# Patient Record
Sex: Female | Born: 1955 | Race: White | Hispanic: No | Marital: Married | Smoking: Never smoker
Health system: Southern US, Community
[De-identification: ages and names within clinical notes are randomized; demographics above are authoritative.]

## PROBLEM LIST (undated history)

## (undated) DIAGNOSIS — Z8719 Personal history of other diseases of the digestive system: Secondary | ICD-10-CM

## (undated) DIAGNOSIS — I10 Essential (primary) hypertension: Secondary | ICD-10-CM

## (undated) DIAGNOSIS — Z8489 Family history of other specified conditions: Secondary | ICD-10-CM

## (undated) DIAGNOSIS — E785 Hyperlipidemia, unspecified: Secondary | ICD-10-CM

## (undated) DIAGNOSIS — K219 Gastro-esophageal reflux disease without esophagitis: Secondary | ICD-10-CM

## (undated) DIAGNOSIS — M1990 Unspecified osteoarthritis, unspecified site: Secondary | ICD-10-CM

## (undated) DIAGNOSIS — T7840XA Allergy, unspecified, initial encounter: Secondary | ICD-10-CM

## (undated) HISTORY — DX: Hyperlipidemia, unspecified: E78.5

## (undated) HISTORY — PX: TUBAL LIGATION: SHX77

## (undated) HISTORY — PX: EYE SURGERY: SHX253

## (undated) HISTORY — DX: Allergy, unspecified, initial encounter: T78.40XA

## (undated) HISTORY — DX: Essential (primary) hypertension: I10

---
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
## 1981-07-10 HISTORY — PX: APPENDECTOMY: SHX54

## 2004-09-09 ENCOUNTER — Ambulatory Visit: Payer: Self-pay | Admitting: Unknown Physician Specialty

## 2006-03-30 DIAGNOSIS — K219 Gastro-esophageal reflux disease without esophagitis: Secondary | ICD-10-CM | POA: Insufficient documentation

## 2006-03-30 DIAGNOSIS — E7800 Pure hypercholesterolemia, unspecified: Secondary | ICD-10-CM | POA: Insufficient documentation

## 2006-03-30 DIAGNOSIS — E782 Mixed hyperlipidemia: Secondary | ICD-10-CM | POA: Insufficient documentation

## 2006-07-10 HISTORY — PX: LASIK: SHX215

## 2006-09-11 DIAGNOSIS — R0681 Apnea, not elsewhere classified: Secondary | ICD-10-CM | POA: Insufficient documentation

## 2007-01-07 DIAGNOSIS — I10 Essential (primary) hypertension: Secondary | ICD-10-CM | POA: Insufficient documentation

## 2008-04-10 DIAGNOSIS — J45909 Unspecified asthma, uncomplicated: Secondary | ICD-10-CM | POA: Insufficient documentation

## 2008-05-14 ENCOUNTER — Ambulatory Visit: Payer: Self-pay | Admitting: Family Medicine

## 2008-05-14 ENCOUNTER — Encounter: Payer: Self-pay | Admitting: Family Medicine

## 2008-05-28 ENCOUNTER — Ambulatory Visit: Payer: Self-pay | Admitting: Obstetrics & Gynecology

## 2008-11-09 DIAGNOSIS — K921 Melena: Secondary | ICD-10-CM | POA: Insufficient documentation

## 2009-02-22 DIAGNOSIS — R42 Dizziness and giddiness: Secondary | ICD-10-CM | POA: Insufficient documentation

## 2009-11-16 ENCOUNTER — Ambulatory Visit: Payer: Self-pay | Admitting: Family Medicine

## 2009-11-16 DIAGNOSIS — G2581 Restless legs syndrome: Secondary | ICD-10-CM | POA: Insufficient documentation

## 2010-11-22 NOTE — Assessment & Plan Note (Signed)
NAME:  Bridget Green, Bridget Green              ACCOUNT NO.:  [id number]   MEDICAL RECORD NO.:  [id number]          PATIENT TYPE:  POB   LOCATION:  [facility] at [facility]         FACILITY:  [facility]   PHYSICIAN:  Tinnie Gens, MD        DATE OF BIRTH:  07-04-1956   DATE OF SERVICE:  05/13/2008                                  CLINIC NOTE   CHIEF COMPLAINT:  Abnormal Pap.   HISTORY OF PRESENT ILLNESS:  The patient is a 54 year old gravida 2,  para 2, who was referred from [facility] for an  abnormal Pap smear.  This is her first abnormal Pap ever she is married  and has no new sexual partners.  I do not find evidence of high-risk HPV  typing being done on this patient.   PAST MEDICAL HISTORY:  Significant for hypertension,  hypercholesterolemia, reflux, and asthma.   PAST SURGICAL HISTORY:  C-section x2, tonsillectomy, appendectomy, and  LASIK eye surgery   MEDICATIONS:  1. Lisinopril 5 mg 1 p.o. daily.  2. Prilosec OTC 1 p.o. daily.  3. Niacin 1 p.o. daily.  4. Multivitamin 1 p.o. daily.  5. Fish oil 1000 mg 2 p.o. b.i.d.  6. Fiber therapy at 5 g per day.  7. Advair Diskus 250/50 one b.i.d.  8. Allegra D 1 daily.  9. Nasonex 2 sprays each nostril daily.  10.Lisinopril 5 mg daily   ALLERGIES:  PENICILLIN and SULFA.   FAMILY HISTORY:  Significant for cardiac disease and hypertension.   SOCIAL HISTORY:  The patient is married.  She lives with her spouse.  She is getting ready to have her first grandchild.  She is a [employer] architect.  She has no smoking history.  No alcohol use.   REVIEW OF SYSTEMS:  Reviewed.  Please see GYN history in the chart, but  is essentially negative, except for hot flashes and night sweats, which  continue.  She reports being menopausal since age 60.   PHYSICAL EXAMINATION:  GENERAL:  She is a well-developed, well-nourished  female in no acute distress.  ABDOMEN:  Soft, nontender, and nondistended.  GU:  Normal external female  genitalia.  BUS is normal.  Vagina is  somewhat atrophic.  Cervix is very small and Pap smear is done.   PROCEDURE:  Colposcopy.  Acetic acid wash was placed over the cervix.  No significant acetowhite areas were identified.  Attempted endocervical  curettage is made, although the cervix is somewhat stenotic.   IMPRESSION:  Abnormal Pap atypical squamous cells of uncertain  significance.  No history of high-risk human papillomavirus being done.  We will await results of that and have her follow up in 2 weeks for  results.  We will keep you updated as to her progress.   Thank you again for referring this patient to our office.             ______________________________  Tinnie Gens, MD     TP/MEDQ  D:  05/14/2008  T:  05/15/2008  Job:  [id number]

## 2011-04-20 ENCOUNTER — Ambulatory Visit: Payer: Self-pay | Admitting: Family Medicine

## 2011-09-26 ENCOUNTER — Ambulatory Visit: Payer: Self-pay | Admitting: Family Medicine

## 2011-09-29 ENCOUNTER — Ambulatory Visit: Payer: Self-pay | Admitting: Family Medicine

## 2011-11-22 ENCOUNTER — Ambulatory Visit: Payer: Self-pay | Admitting: General Surgery

## 2011-12-07 ENCOUNTER — Ambulatory Visit: Payer: Self-pay | Admitting: Unknown Physician Specialty

## 2011-12-07 LAB — BASIC METABOLIC PANEL
Anion Gap: 5 — ABNORMAL LOW (ref 7–16)
BUN: 11 mg/dL (ref 7–18)
Calcium, Total: 9.1 mg/dL (ref 8.5–10.1)
Chloride: 107 mmol/L (ref 98–107)
Creatinine: 0.66 mg/dL (ref 0.60–1.30)
EGFR (African American): >60
EGFR (Non-African Amer.): >60
Sodium: 141 mmol/L (ref 136–145)

## 2011-12-07 LAB — CBC WITH DIFFERENTIAL/PLATELET
Basophil %: 0.4 %
Eosinophil %: 1.1 %
HCT: 42.8 % (ref 35.0–47.0)
Lymphocyte #: 2.8 10*3/uL (ref 1.0–3.6)
MCH: 30.3 pg (ref 26.0–34.0)
MCHC: 33.3 g/dL (ref 32.0–36.0)
Monocyte #: 0.4 x10 3/mm (ref 0.2–0.9)
Neutrophil #: 1.9 10*3/uL (ref 1.4–6.5)
Platelet: 232 10*3/uL (ref 150–440)

## 2011-12-08 ENCOUNTER — Ambulatory Visit: Payer: Self-pay | Admitting: General Surgery

## 2011-12-09 HISTORY — PX: CHOLECYSTECTOMY: SHX55

## 2011-12-12 LAB — PATHOLOGY REPORT

## 2012-04-24 ENCOUNTER — Ambulatory Visit: Payer: Self-pay | Admitting: Family Medicine

## 2013-04-17 ENCOUNTER — Ambulatory Visit: Payer: Self-pay | Admitting: Family Medicine

## 2014-04-28 ENCOUNTER — Ambulatory Visit: Payer: Self-pay | Admitting: Family Medicine

## 2014-08-04 DIAGNOSIS — M2550 Pain in unspecified joint: Secondary | ICD-10-CM | POA: Insufficient documentation

## 2014-10-22 ENCOUNTER — Ambulatory Visit: Admit: 2014-10-22 | Disposition: A | Discharge status: Home / Self Care | Payer: Self-pay | Admitting: Family Medicine

## 2015-02-22 ENCOUNTER — Other Ambulatory Visit: Payer: Self-pay | Admitting: Family Medicine

## 2015-03-22 ENCOUNTER — Telehealth: Payer: Self-pay | Admitting: Family Medicine

## 2015-03-22 NOTE — Telephone Encounter (Signed)
Left patient a voicemail advising her to call back to schedule a appointment to receive a Tdap.

## 2015-03-22 NOTE — Telephone Encounter (Signed)
Pt states she is need to have a Tdap due to having a grandchild being born.  Pt would like to come after 3:30.  CB#[phone]/MW

## 2015-03-23 ENCOUNTER — Ambulatory Visit: Payer: Self-pay | Admitting: Family Medicine

## 2015-03-23 DIAGNOSIS — E559 Vitamin D deficiency, unspecified: Secondary | ICD-10-CM | POA: Insufficient documentation

## 2015-03-23 DIAGNOSIS — K2100 Gastro-esophageal reflux disease with esophagitis, without bleeding: Secondary | ICD-10-CM | POA: Insufficient documentation

## 2015-03-23 DIAGNOSIS — M6790 Unspecified disorder of synovium and tendon, unspecified site: Secondary | ICD-10-CM | POA: Insufficient documentation

## 2015-03-23 DIAGNOSIS — B029 Zoster without complications: Secondary | ICD-10-CM | POA: Insufficient documentation

## 2015-03-26 ENCOUNTER — Ambulatory Visit (INDEPENDENT_AMBULATORY_CARE_PROVIDER_SITE_OTHER): Payer: BLUE CROSS/BLUE SHIELD | Admitting: Family Medicine

## 2015-03-26 VITALS — Temp 98.0°F

## 2015-03-26 DIAGNOSIS — Z23 Encounter for immunization: Secondary | ICD-10-CM

## 2015-04-12 ENCOUNTER — Ambulatory Visit (INDEPENDENT_AMBULATORY_CARE_PROVIDER_SITE_OTHER): Payer: BLUE CROSS/BLUE SHIELD | Admitting: Family Medicine

## 2015-04-12 ENCOUNTER — Encounter: Payer: Self-pay | Admitting: Family Medicine

## 2015-04-12 VITALS — BP 132/90 | HR 76 | Temp 98.1°F | Resp 16 | Wt 193.2 lb

## 2015-04-12 DIAGNOSIS — M545 Low back pain, unspecified: Secondary | ICD-10-CM

## 2015-04-12 DIAGNOSIS — M5136 Other intervertebral disc degeneration, lumbar region: Secondary | ICD-10-CM

## 2015-04-12 MED ORDER — MELOXICAM 15 MG PO TABS: 15.0000 mg | ORAL_TABLET | Freq: Every day | ORAL | Status: DC

## 2015-04-12 NOTE — Progress Notes (Signed)
Patient ID: Bridget Green, female   DOB: [birth year], 59 y.o.   MRN: [id number] Name: Bridget Green   MRN: [id number]    DOB: 05/11/1956   Date:04/12/2015       Progress Note  Subjective  Chief Complaint  Chief Complaint  Patient presents with  . Back Pain    lower back pain X 1 month    Back Pain This is a new problem. The current episode started more than 1 month ago. The problem occurs constantly. The problem has been gradually worsening since onset. The pain is present in the lumbar spine. The quality of the pain is described as aching. The pain radiates to the right thigh. The pain is at a severity of 3/10. The pain is moderate. The pain is worse during the day. The symptoms are aggravated by sitting. Stiffness is present all day. Associated symptoms include leg pain. Pertinent negatives include no numbness, paresthesias, tingling or weakness. She has tried NSAIDs (Only uses 3 tablets of Ibuprofen 200 mg BID) for the symptoms.    Patient Active Problem List   Diagnosis Date Noted  . Esophagitis, reflux 03/23/2015  . Herpes zona 03/23/2015  . Tendon nodule 03/23/2015  . Avitaminosis D 03/23/2015  . Ache in joint 08/04/2014  . Restless leg 11/16/2009  . Blood in feces 11/09/2008  . Asthma, exogenous 04/10/2008  . Essential (primary) hypertension 01/07/2007  . Breathlessness on exertion 09/11/2006  . Acid reflux 03/30/2006  . Hypercholesterolemia without hypertriglyceridemia 03/30/2006   Past Surgical History  Procedure Laterality Date  . Lasik Bilateral 2008  . Cesarean section  [id number]  . Appendectomy  1983  . Cholecystectomy  12/2011  . Tonsillectomy  1960  . Adenoidectomy  1960   History reviewed. No pertinent past medical history.  Social History  Substance Use Topics  . Smoking status: Never Smoker   . Smokeless tobacco: Never Used  . Alcohol Use: 0.0 oz/week    0 Standard drinks or equivalent per week     Comment: OCCASIONALLY    Current outpatient  prescriptions:  .  cholecalciferol (VITAMIN D) 1000 UNITS tablet, Take 1 tablet by mouth daily., Disp: , Rfl:  .  fluticasone (FLONASE) 50 MCG/ACT nasal spray, , Disp: , Rfl: 2 .  lisinopril (PRINIVIL,ZESTRIL) 5 MG tablet, Take 1 tablet by mouth daily., Disp: , Rfl:  .  MULTIPLE VITAMINS PO, Take 1 tablet by mouth daily., Disp: , Rfl:  .  Omega-3 Fatty Acids (FISH OIL) 1200 MG CAPS, Take 2 capsules by mouth daily., Disp: , Rfl:  .  Omeprazole 20 MG TBEC, Take 1 tablet by mouth daily., Disp: , Rfl:  .  ZETIA 10 MG tablet, TAKE ONE TABLET BY MOUTH EACH DAY., Disp: 30 tablet, Rfl: 3  Allergies  Allergen Reactions  . Penicillins Itching  . Sulfa Antibiotics Itching   Review of Systems  Constitutional: Negative.   HENT: Negative.   Eyes: Negative.   Respiratory: Negative.   Cardiovascular: Negative.   Gastrointestinal: Negative.   Genitourinary: Negative.   Musculoskeletal: Positive for back pain.  Skin: Negative.   Neurological: Negative.  Negative for tingling, weakness, numbness and paresthesias.  Endo/Heme/Allergies: Negative.   Psychiatric/Behavioral: Negative.    Objective  Filed Vitals:   04/12/15 1525  BP: 132/90  Pulse: 76  Temp: 98.1 F (36.7 C)  TempSrc: Oral  Resp: 16  Weight: 193 lb 3.2 oz (87.635 kg)  SpO2: 98%   Physical Exam  Constitutional: She is oriented to person, place,  and time and well-developed, well-nourished, and in no distress.  Cardiovascular: Normal rate and regular rhythm.   Pulmonary/Chest: Effort normal and breath sounds normal.  Abdominal: Soft. Bowel sounds are normal.  Musculoskeletal:  Ache and stiffness in lower back. Some radiation to the right anterior thigh but never below the knee. No weakness or numbness. SLR's 90 degrees with some pulling pain on the right.  Neurological: She is alert and oriented to person, place, and time. She has normal reflexes.  Psychiatric: Affect normal.   Assessment & Plan 1. Low back pain associated  with a spinal disorder other than radiculopathy or spinal stenosis Onset gradually over the past month. No known specific injury. Suspect flare of arthritis documented on L-S spine x-ray on 10-22-14. Recommend moist heat applications and given stronger NSAID. May add Percogesic if mild muscle relaxer needed. Recheck in 3-4 weeks if no better.  - meloxicam (MOBIC) 15 MG tablet; Take 1 tablet (15 mg total) by mouth daily.  Dispense: 30 tablet; Refill: 0  2. Lumbar degenerative disc disease Documented L2-L5 of diffuse DDD with sclerosis and spurring on 10-22-14. No radicular pain today. No muscle weakness. Recheck in 3-4 weeks if no better. May need referral to spine specialist/neurosurgeon or trial of prednisone taper.

## 2015-05-06 ENCOUNTER — Telehealth: Payer: Self-pay | Admitting: Family Medicine

## 2015-05-06 ENCOUNTER — Other Ambulatory Visit: Payer: Self-pay | Admitting: Family Medicine

## 2015-05-06 DIAGNOSIS — Z1231 Encounter for screening mammogram for malignant neoplasm of breast: Secondary | ICD-10-CM

## 2015-05-06 NOTE — Telephone Encounter (Signed)
Pt called to let Simona Huh know that the Rx Mobic is working and is having no back pain right now.  CB#[phone]/MW

## 2015-05-06 NOTE — Telephone Encounter (Signed)
Glad to hear it. Use Mobic as needed. Call or return prn.

## 2015-05-08 ENCOUNTER — Other Ambulatory Visit: Payer: Self-pay | Admitting: Family Medicine

## 2015-05-11 ENCOUNTER — Ambulatory Visit
Admission: RE | Admit: 2015-05-11 | Discharge: 2015-05-11 | Disposition: A | Discharge status: Home / Self Care | Payer: BLUE CROSS/BLUE SHIELD | Source: Ambulatory Visit | Attending: Family Medicine | Admitting: Family Medicine

## 2015-05-11 DIAGNOSIS — Z1231 Encounter for screening mammogram for malignant neoplasm of breast: Secondary | ICD-10-CM | POA: Diagnosis not present

## 2015-05-12 ENCOUNTER — Telehealth: Payer: Self-pay

## 2015-05-12 NOTE — Telephone Encounter (Signed)
-----   Message from Margo Common, [place] sent at 05/11/2015  6:06 PM EDT ----- Normal mammogram. Recheck annually.

## 2015-05-12 NOTE — Telephone Encounter (Signed)
Left patient a message on cell voicemail (consent in chart) advising her that results were normal.

## 2015-07-01 ENCOUNTER — Other Ambulatory Visit: Payer: Self-pay | Admitting: Family Medicine

## 2015-07-01 DIAGNOSIS — E7800 Pure hypercholesterolemia, unspecified: Secondary | ICD-10-CM

## 2015-07-01 NOTE — Telephone Encounter (Signed)
Bridget Green.  

## 2015-07-02 ENCOUNTER — Other Ambulatory Visit: Payer: Self-pay | Admitting: Family Medicine

## 2015-07-02 DIAGNOSIS — J309 Allergic rhinitis, unspecified: Secondary | ICD-10-CM

## 2015-08-19 ENCOUNTER — Other Ambulatory Visit: Payer: Self-pay | Admitting: Family Medicine

## 2015-08-19 ENCOUNTER — Ambulatory Visit (INDEPENDENT_AMBULATORY_CARE_PROVIDER_SITE_OTHER): Payer: BLUE CROSS/BLUE SHIELD | Admitting: Family Medicine

## 2015-08-19 ENCOUNTER — Encounter: Payer: Self-pay | Admitting: Family Medicine

## 2015-08-19 VITALS — BP 132/70 | HR 72 | Temp 97.7°F | Resp 16 | Wt 188.0 lb

## 2015-08-19 DIAGNOSIS — E7800 Pure hypercholesterolemia, unspecified: Secondary | ICD-10-CM

## 2015-08-19 DIAGNOSIS — Z0000 Encounter for general adult medical examination without abnormal findings: Secondary | ICD-10-CM

## 2015-08-19 DIAGNOSIS — Z124 Encounter for screening for malignant neoplasm of cervix: Secondary | ICD-10-CM | POA: Diagnosis not present

## 2015-08-19 DIAGNOSIS — I10 Essential (primary) hypertension: Secondary | ICD-10-CM

## 2015-08-19 DIAGNOSIS — J4520 Mild intermittent asthma, uncomplicated: Secondary | ICD-10-CM | POA: Diagnosis not present

## 2015-08-19 DIAGNOSIS — K219 Gastro-esophageal reflux disease without esophagitis: Secondary | ICD-10-CM | POA: Diagnosis not present

## 2015-08-19 NOTE — Progress Notes (Signed)
Patient ID: Bridget Green, female   DOB: 1955-09-12, 60 y.o.   MRN: [id number]       [name], Female    DOB: 25-Jul-1955, 60 y.o.   MRN: [id number] Visit Date: 08/19/2015  Today's Provider: Vernie Murders, PA   Chief Complaint  Patient presents with  . Annual Exam  . visual changes    X 1 week.    Subjective:    Annual physical exam Bridget Green is a 60 y.o. female who presents today for health maintenance and complete physical. She feels well. She reports exercising not regularly. She reports she is sleeping well.  Last:  Tdap- 03/26/2015  Zoster- 11/27/2014  Mammogram- 05/11/2015  Colonoscopy- 08/28/2008. Repeat by Dr. Vira Agar in                2015 per patient.  Pap- 05/27/2013. Normal, HPV negative.  Hep C- 04/26/2012. Negative       Review of Systems  Constitutional: Negative.   HENT: Negative.   Eyes: Negative.   Respiratory: Negative.   Cardiovascular: Negative.  Negative for chest pain.  Gastrointestinal: Negative.   Endocrine: Negative.   Genitourinary: Negative.   Musculoskeletal: Negative.   Skin: Negative.   Allergic/Immunologic: Negative.   Neurological: Negative.   Hematological: Negative.   Psychiatric/Behavioral: Negative.     Social History      She  reports that she has never smoked. She has never used smokeless tobacco. She reports that she drinks alcohol. She reports that she does not use illicit drugs.       Social History   Social History  . Marital Status: Married    Spouse Name: N/A  . Number of Children: N/A  . Years of Education: N/A   Social History Main Topics  . Smoking status: Never Smoker   . Smokeless tobacco: Never Used  . Alcohol Use: 0.0 oz/week    0 Standard drinks or equivalent per week     Comment: OCCASIONALLY  . Drug Use: No  . Sexual Activity: Not Asked   Other Topics Concern  . None   Social History Narrative    Patient Active Problem List   Diagnosis Date Noted  . Esophagitis,  reflux 03/23/2015  . Herpes zona 03/23/2015  . Tendon nodule 03/23/2015  . Avitaminosis D 03/23/2015  . Ache in joint 08/04/2014  . Restless leg 11/16/2009  . Blood in feces 11/09/2008  . Asthma, exogenous 04/10/2008  . Essential (primary) hypertension 01/07/2007  . Breathlessness on exertion 09/11/2006  . Acid reflux 03/30/2006  . Hypercholesterolemia without hypertriglyceridemia 03/30/2006    Past Surgical History  Procedure Laterality Date  . Lasik Bilateral 2008  . Cesarean section  [id number]  . Appendectomy  1983  . Cholecystectomy  12/2011  . Tonsillectomy  1960  . Adenoidectomy  1960    Family History        Family Status  Relation Status Death Age  . Father Deceased 41  . Sister Alive   . Maternal Grandmother Deceased 40  . Maternal Grandfather Deceased   . Paternal Grandmother Deceased   . Paternal Grandfather Deceased         Her family history includes Aneurysm in her paternal grandfather; Arthritis in her mother and sister; Colon cancer in her father; Heart attack in her maternal grandfather and paternal grandmother; Hyperlipidemia in her mother; Hypertension in her mother and sister; Thyroid cancer in her paternal grandmother.    Allergies  Allergen Reactions  . Penicillins  Itching  . Sulfa Antibiotics Itching    Previous Medications   CHOLECALCIFEROL (VITAMIN D) 1000 UNITS TABLET    Take 1 tablet by mouth daily.   FLUTICASONE (FLONASE) 50 MCG/ACT NASAL SPRAY    INSTILL 2 SPRAYS INTO EACH NOSTRIL AT BEDTIME   LISINOPRIL (PRINIVIL,ZESTRIL) 5 MG TABLET    Take 1 tablet by mouth daily.   MELOXICAM (MOBIC) 15 MG TABLET    TAKE ONE TABLET BY MOUTH EVERY DAY   MULTIPLE VITAMINS PO    Take 1 tablet by mouth daily.   OMEGA-3 FATTY ACIDS (FISH OIL) 1200 MG CAPS    Take 2 capsules by mouth daily.   OMEPRAZOLE 20 MG TBEC    Take 1 tablet by mouth daily.   ZETIA 10 MG TABLET    TAKE ONE TABLET BY MOUTH EVERY DAY    Patient Care Team: Margo Common, PA as PCP  - General (Physician Assistant)     Objective:   Vitals: BP 132/70 mmHg  Pulse 72  Temp(Src) 97.7 F (36.5 C)  Resp 16  Wt 188 lb (85.276 kg)   Physical Exam  Constitutional: She is oriented to person, place, and time. She appears well-developed and well-nourished.  HENT:  Head: Normocephalic and atraumatic.  Right Ear: External ear normal.  Left Ear: External ear normal.  Nose: Nose normal.  Mouth/Throat: Oropharynx is clear and moist.  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  Eyes: Conjunctivae and EOM are normal. Pupils are equal, round, and reactive to light. Right eye exhibits no discharge.  Neck: Normal range of motion. Neck supple. No tracheal deviation present. No thyromegaly present.  Cardiovascular: Normal rate, regular rhythm, normal heart sounds and intact distal pulses.   No murmur heard. Pulmonary/Chest: Effort normal and breath sounds normal. No respiratory distress. She has no wheezes. She has no rales. She exhibits no tenderness.  Abdominal: Soft. She exhibits no distension and no mass. There is no tenderness. There is no rebound and no guarding.  Genitourinary: Vagina normal and uterus normal. Guaiac negative stool.  Musculoskeletal: Normal range of motion. She exhibits no edema or tenderness.  Lymphadenopathy:    She has no cervical adenopathy.  Neurological: She is alert and oriented to person, place, and time. She has normal reflexes. No cranial nerve deficit. She exhibits normal muscle tone. Coordination normal.  Skin: Skin is warm and dry. No rash noted. No erythema.  Psychiatric: She has a normal mood and affect. Her behavior is normal. Judgment and thought content normal.   Assessment & Plan:     Routine Health Maintenance and Physical Exam  Exercise Activities and Dietary recommendations Goals    Recommend walking for exercise 20-30 minute 3-4 days a week and low fat diet.      Immunization History  Administered Date(s) Administered    . Tdap 03/26/2015  . Zoster 11/27/2014    Health Maintenance  Topic Date Due  . HIV Screening  03/17/1969  . INFLUENZA VACCINE  02/08/2015  . PAP SMEAR  05/27/2016  . MAMMOGRAM  05/10/2017  . COLONOSCOPY  08/28/2018  . TETANUS/TDAP  03/25/2025  . ZOSTAVAX  Completed  . Hepatitis C Screening  Completed      Discussed health benefits of physical activity, and encouraged her to engage in regular exercise appropriate for her age and condition.    -------------------------------------------------------------------- 1. Annual physical exam General health stable. Uses hearing aids bilaterally. Due to BMD and recommend continuing vitamin D daily. Colonoscopy and vaccinations up  to date. - DG Bone Density  2. Essential (primary) hypertension Well controlled BP and tolerating Lisinopril 5 mg qd without side effects. Check routine labs and continue present regimen. - CBC with Differential/Platelet  3. Gastroesophageal reflux disease without esophagitis Continues Omeprazole 20 mg prn. No significant dyspepsia, hematemesis or melena recently.   4. Hypercholesterolemia without hypertriglyceridemia Tolerating Zetia and Omega-3 supplement without side effects. Will recheck CMP, TSH and lipids. Continue present regimen and recheck pending reports. - COMPLETE METABOLIC PANEL WITH GFR - Lipid panel - TSH  5. Asthma, exogenous, mild intermittent, uncomplicated Well controlled with rare use of rescue inhaler. Controlling allergic rhinitis with occasional OTC antihistamine and Flonase. Recheck prn.  6. Cervical cancer screening Due for follow up PAP. Last PAP normal 05-27-13. Denies menses, vaginal discharge or pelvic pain. - Cytology - PAP

## 2015-08-19 NOTE — Patient Instructions (Signed)
Health Maintenance, Female Adopting a healthy lifestyle and getting preventive care can go a long way to promote health and wellness. Talk with your health care provider about what schedule of regular examinations is right for you. This is a good chance for you to check in with your provider about disease prevention and staying healthy. In between checkups, there are plenty of things you can do on your own. Experts have done a lot of research about which lifestyle changes and preventive measures are most likely to keep you healthy. Ask your health care provider for more information. WEIGHT AND DIET  Eat a healthy diet  Be sure to include plenty of vegetables, fruits, low-fat dairy products, and lean protein.  Do not eat a lot of foods high in solid fats, added sugars, or salt.  Get regular exercise. This is one of the most important things you can do for your health.  Most adults should exercise for at least 150 minutes each week. The exercise should increase your heart rate and make you sweat (moderate-intensity exercise).  Most adults should also do strengthening exercises at least twice a week. This is in addition to the moderate-intensity exercise.  Maintain a healthy weight  Body mass index (BMI) is a measurement that can be used to identify possible weight problems. It estimates body fat based on height and weight. Your health care provider can help determine your BMI and help you achieve or maintain a healthy weight.  For females 20 years of age and older:   A BMI below 18.5 is considered underweight.  A BMI of 18.5 to 24.9 is normal.  A BMI of 25 to 29.9 is considered overweight.  A BMI of 30 and above is considered obese.  Watch levels of cholesterol and blood lipids  You should start having your blood tested for lipids and cholesterol at 62 years of age, then have this test every 5 years.  You may need to have your cholesterol levels checked more often if:  Your lipid  or cholesterol levels are high.  You are older than 62 years of age.  You are at high risk for heart disease.  CANCER SCREENING   Lung Cancer  Lung cancer screening is recommended for adults 55-80 years old who are at high risk for lung cancer because of a history of smoking.  A yearly low-dose CT scan of the lungs is recommended for people who:  Currently smoke.  Have quit within the past 15 years.  Have at least a 30-pack-year history of smoking. A pack year is smoking an average of one pack of cigarettes a day for 1 year.  Yearly screening should continue until it has been 15 years since you quit.  Yearly screening should stop if you develop a health problem that would prevent you from having lung cancer treatment.  Breast Cancer  Practice breast self-awareness. This means understanding how your breasts normally appear and feel.  It also means doing regular breast self-exams. Let your health care provider know about any changes, no matter how small.  If you are in your 20s or 30s, you should have a clinical breast exam (CBE) by a health care provider every 1-3 years as part of a regular health exam.  If you are 40 or older, have a CBE every year. Also consider having a breast X-ray (mammogram) every year.  If you have a family history of breast cancer, talk to your health care provider about genetic screening.  If you   are at high risk for breast cancer, talk to your health care provider about having an MRI and a mammogram every year.  Breast cancer gene (BRCA) assessment is recommended for women who have family members with BRCA-related cancers. BRCA-related cancers include:  Breast.  Ovarian.  Tubal.  Peritoneal cancers.  Results of the assessment will determine the need for genetic counseling and BRCA1 and BRCA2 testing. Cervical Cancer Your health care provider may recommend that you be screened regularly for cancer of the pelvic organs (ovaries, uterus, and  vagina). This screening involves a pelvic examination, including checking for microscopic changes to the surface of your cervix (Pap test). You may be encouraged to have this screening done every 3 years, beginning at age 21.  For women ages 30-65, health care providers may recommend pelvic exams and Pap testing every 3 years, or they may recommend the Pap and pelvic exam, combined with testing for human papilloma virus (HPV), every 5 years. Some types of HPV increase your risk of cervical cancer. Testing for HPV may also be done on women of any age with unclear Pap test results.  Other health care providers may not recommend any screening for nonpregnant women who are considered low risk for pelvic cancer and who do not have symptoms. Ask your health care provider if a screening pelvic exam is right for you.  If you have had past treatment for cervical cancer or a condition that could lead to cancer, you need Pap tests and screening for cancer for at least 20 years after your treatment. If Pap tests have been discontinued, your risk factors (such as having a new sexual partner) need to be reassessed to determine if screening should resume. Some women have medical problems that increase the chance of getting cervical cancer. In these cases, your health care provider may recommend more frequent screening and Pap tests. Colorectal Cancer  This type of cancer can be detected and often prevented.  Routine colorectal cancer screening usually begins at 62 years of age and continues through 62 years of age.  Your health care provider may recommend screening at an earlier age if you have risk factors for colon cancer.  Your health care provider may also recommend using home test kits to check for hidden blood in the stool.  A small camera at the end of a tube can be used to examine your colon directly (sigmoidoscopy or colonoscopy). This is done to check for the earliest forms of colorectal  cancer.  Routine screening usually begins at age 50.  Direct examination of the colon should be repeated every 5-10 years through 62 years of age. However, you may need to be screened more often if early forms of precancerous polyps or small growths are found. Skin Cancer  Check your skin from head to toe regularly.  Tell your health care provider about any new moles or changes in moles, especially if there is a change in a mole's shape or color.  Also tell your health care provider if you have a mole that is larger than the size of a pencil eraser.  Always use sunscreen. Apply sunscreen liberally and repeatedly throughout the day.  Protect yourself by wearing long sleeves, pants, a wide-brimmed hat, and sunglasses whenever you are outside. HEART DISEASE, DIABETES, AND HIGH BLOOD PRESSURE   High blood pressure causes heart disease and increases the risk of stroke. High blood pressure is more likely to develop in:  People who have blood pressure in the high end   of the normal range (130-139/85-89 mm Hg).  People who are overweight or obese.  People who are African American.  If you are 38-23 years of age, have your blood pressure checked every 3-5 years. If you are 61 years of age or older, have your blood pressure checked every year. You should have your blood pressure measured twice--once when you are at a hospital or clinic, and once when you are not at a hospital or clinic. Record the average of the two measurements. To check your blood pressure when you are not at a hospital or clinic, you can use:  An automated blood pressure machine at a pharmacy.  A home blood pressure monitor.  If you are between 45 years and 39 years old, ask your health care provider if you should take aspirin to prevent strokes.  Have regular diabetes screenings. This involves taking a blood sample to check your fasting blood sugar level.  If you are at a normal weight and have a low risk for diabetes,  have this test once every three years after 62 years of age.  If you are overweight and have a high risk for diabetes, consider being tested at a younger age or more often. PREVENTING INFECTION  Hepatitis B  If you have a higher risk for hepatitis B, you should be screened for this virus. You are considered at high risk for hepatitis B if:  You were born in a country where hepatitis B is common. Ask your health care provider which countries are considered high risk.  Your parents were born in a high-risk country, and you have not been immunized against hepatitis B (hepatitis B vaccine).  You have HIV or AIDS.  You use needles to inject street drugs.  You live with someone who has hepatitis B.  You have had sex with someone who has hepatitis B.  You get hemodialysis treatment.  You take certain medicines for conditions, including cancer, organ transplantation, and autoimmune conditions. Hepatitis C  Blood testing is recommended for:  Everyone born from [birth year] through 1967.  Anyone with known risk factors for hepatitis C. Sexually transmitted infections (STIs)  You should be screened for sexually transmitted infections (STIs) including gonorrhea and chlamydia if:  You are sexually active and are younger than 62 years of age.  You are older than 62 years of age and your health care provider tells you that you are at risk for this type of infection.  Your sexual activity has changed since you were last screened and you are at an increased risk for chlamydia or gonorrhea. Ask your health care provider if you are at risk.  If you do not have HIV, but are at risk, it may be recommended that you take a prescription medicine daily to prevent HIV infection. This is called pre-exposure prophylaxis (PrEP). You are considered at risk if:  You are sexually active and do not regularly use condoms or know the HIV status of your partner(s).  You take drugs by injection.  You are sexually  active with a partner who has HIV. Talk with your health care provider about whether you are at high risk of being infected with HIV. If you choose to begin PrEP, you should first be tested for HIV. You should then be tested every 3 months for as long as you are taking PrEP.  PREGNANCY   If you are premenopausal and you may become pregnant, ask your health care provider about preconception counseling.  If you may  become pregnant, take 400 to 800 micrograms (mcg) of folic acid every day.  If you want to prevent pregnancy, talk to your health care provider about birth control (contraception). OSTEOPOROSIS AND MENOPAUSE   Osteoporosis is a disease in which the bones lose minerals and strength with aging. This can result in serious bone fractures. Your risk for osteoporosis can be identified using a bone density scan.  If you are 65 years of age or older, or if you are at risk for osteoporosis and fractures, ask your health care provider if you should be screened.  Ask your health care provider whether you should take a calcium or vitamin D supplement to lower your risk for osteoporosis.  Menopause may have certain physical symptoms and risks.  Hormone replacement therapy may reduce some of these symptoms and risks. Talk to your health care provider about whether hormone replacement therapy is right for you.  HOME CARE INSTRUCTIONS   Schedule regular health, dental, and eye exams.  Stay current with your immunizations.   Do not use any tobacco products including cigarettes, chewing tobacco, or electronic cigarettes.  If you are pregnant, do not drink alcohol.  If you are breastfeeding, limit how much and how often you drink alcohol.  Limit alcohol intake to no more than 1 drink per day for nonpregnant women. One drink equals 12 ounces of beer, 5 ounces of wine, or 1 ounces of hard liquor.  Do not use street drugs.  Do not share needles.  Ask your health care provider for help if  you need support or information about quitting drugs.  Tell your health care provider if you often feel depressed.  Tell your health care provider if you have ever been abused or do not feel safe at home.   This information is not intended to replace advice given to you by your health care provider. Make sure you discuss any questions you have with your health care provider.   Document Released: 01/09/2011 Document Revised: 07/17/2014 Document Reviewed: 05/28/2013 Elsevier Interactive Patient Education 2016 [employer]   Exercising to Stay Healthy Exercising regularly is important. It has many health benefits, such as:  Improving your overall fitness, flexibility, and endurance.  Increasing your bone density.  Helping with weight control.  Decreasing your body fat.  Increasing your muscle strength.  Reducing stress and tension.  Improving your overall health. In order to become healthy and stay healthy, it is recommended that you do moderate-intensity and vigorous-intensity exercise. You can tell that you are exercising at a moderate intensity if you have a higher heart rate and faster breathing, but you are still able to hold a conversation. You can tell that you are exercising at a vigorous intensity if you are breathing much harder and faster and cannot hold a conversation while exercising. HOW OFTEN SHOULD I EXERCISE? Choose an activity that you enjoy and set realistic goals. Your health care provider can help you to make an activity plan that works for you. Exercise regularly as directed by your health care provider. This may include:   Doing resistance training twice each week, such as:  Push-ups.  Sit-ups.  Lifting weights.  Using resistance bands.  Doing a given intensity of exercise for a given amount of time. Choose from these options:  150 minutes of moderate-intensity exercise every week.  75 minutes of vigorous-intensity exercise every week.  A mix of  moderate-intensity and vigorous-intensity exercise every week. Children, pregnant women, people who are out of shape, people   who are overweight, and older adults may need to consult a health care provider for individual recommendations. If you have any sort of medical condition, be sure to consult your health care provider before starting a new exercise program.  WHAT ARE SOME EXERCISE IDEAS? Some moderate-intensity exercise ideas include:   Walking at a rate of 1 mile in 15 minutes.  Biking.  Hiking.  Golfing.  Dancing. Some vigorous-intensity exercise ideas include:   Walking at a rate of at least 4.5 miles per hour.  Jogging or running at a rate of 5 miles per hour.  Biking at a rate of at least 10 miles per hour.  Lap swimming.  Roller-skating or in-line skating.  Cross-country skiing.  Vigorous competitive sports, such as football, basketball, and soccer.  Jumping rope.  Aerobic dancing. WHAT ARE SOME EVERYDAY ACTIVITIES THAT CAN HELP ME TO GET EXERCISE?  Yard work, such as:  Pushing a lawn mower.  Raking and bagging leaves.  Washing and waxing your car.  Pushing a stroller.  Shoveling snow.  Gardening.  Washing windows or floors. HOW CAN I BE MORE ACTIVE IN MY DAY-TO-DAY ACTIVITIES?  Use the stairs instead of the elevator.  Take a walk during your lunch break.  If you drive, park your car farther away from work or school.  If you take public transportation, get off one stop early and walk the rest of the way.  Make all of your phone calls while standing up and walking around.  Get up, stretch, and walk around every 30 minutes throughout the day. WHAT GUIDELINES SHOULD I FOLLOW WHILE EXERCISING?  Do not exercise so much that you hurt yourself, feel dizzy, or get very short of breath.  Consult your health care provider before starting a new exercise program.  Wear comfortable clothes and shoes with good support.  Drink plenty of water while  you exercise to prevent dehydration or heat stroke. Body water is lost during exercise and must be replaced.  Work out until you breathe faster and your heart beats faster.   This information is not intended to replace advice given to you by your health care provider. Make sure you discuss any questions you have with your health care provider.   Document Released: 07/29/2010 Document Revised: 07/17/2014 Document Reviewed: 11/27/2013 Elsevier Interactive Patient Education 2016 [employer]vier Inc.  

## 2015-08-20 LAB — COMPREHENSIVE METABOLIC PANEL
A/G RATIO: 1.8 (ref 1.1–2.5)
ALBUMIN: 4.6 g/dL (ref 3.6–4.8)
ALT: 36 IU/L — AB (ref 0–32)
AST: 21 IU/L (ref 0–40)
Alkaline Phosphatase: 91 IU/L (ref 39–117)
BILIRUBIN TOTAL: 0.5 mg/dL (ref 0.0–1.2)
BUN / CREAT RATIO: 16 (ref 11–26)
BUN: 12 mg/dL (ref 8–27)
CALCIUM: 10 mg/dL (ref 8.7–10.3)
CHLORIDE: 102 mmol/L (ref 96–106)
CO2: 25 mmol/L (ref 18–29)
Creatinine, Ser: 0.76 mg/dL (ref 0.57–1.00)
GFR, EST AFRICAN AMERICAN: 98 mL/min/{1.73_m2} (ref >59)
GFR, EST NON AFRICAN AMERICAN: 85 mL/min/{1.73_m2} (ref >59)
Globulin, Total: 2.6 g/dL (ref 1.5–4.5)
Glucose: 98 mg/dL (ref 65–99)
POTASSIUM: 4.8 mmol/L (ref 3.5–5.2)
Sodium: 145 mmol/L — ABNORMAL HIGH (ref 134–144)
TOTAL PROTEIN: 7.2 g/dL (ref 6.0–8.5)

## 2015-08-20 LAB — CBC WITH DIFFERENTIAL/PLATELET
BASOS ABS: 0 10*3/uL (ref 0.0–0.2)
BASOS: 0 %
EOS (ABSOLUTE): 0.1 10*3/uL (ref 0.0–0.4)
Eos: 1 %
Hematocrit: 41.6 % (ref 34.0–46.6)
Hemoglobin: 14.6 g/dL (ref 11.1–15.9)
IMMATURE GRANS (ABS): 0 10*3/uL (ref 0.0–0.1)
IMMATURE GRANULOCYTES: 0 %
LYMPHS: 55 %
Lymphocytes Absolute: 4.2 10*3/uL — ABNORMAL HIGH (ref 0.7–3.1)
MCH: 31.2 pg (ref 26.6–33.0)
MCHC: 35.1 g/dL (ref 31.5–35.7)
MCV: 89 fL (ref 79–97)
MONOS ABS: 0.4 10*3/uL (ref 0.1–0.9)
Monocytes: 5 %
NEUTROS ABS: 3 10*3/uL (ref 1.4–7.0)
Neutrophils: 39 %
PLATELETS: 267 10*3/uL (ref 150–379)
RBC: 4.68 x10E6/uL (ref 3.77–5.28)
RDW: 12.7 % (ref 12.3–15.4)
WBC: 7.7 10*3/uL (ref 3.4–10.8)

## 2015-08-20 LAB — LIPID PANEL
CHOLESTEROL TOTAL: 217 mg/dL — AB (ref 100–199)
Chol/HDL Ratio: 2.7 ratio units (ref 0.0–4.4)
HDL: 79 mg/dL (ref >39)
LDL Calculated: 115 mg/dL — ABNORMAL HIGH (ref 0–99)
TRIGLYCERIDES: 114 mg/dL (ref 0–149)
VLDL CHOLESTEROL CAL: 23 mg/dL (ref 5–40)

## 2015-08-20 LAB — TSH: TSH: 0.879 u[IU]/mL (ref 0.450–4.500)

## 2015-08-23 ENCOUNTER — Telehealth: Payer: Self-pay | Admitting: Family Medicine

## 2015-08-23 NOTE — Telephone Encounter (Signed)
Pt is requesting lab results.  CB#[phone]/MW

## 2015-08-24 LAB — PAP LB (LIQUID-BASED): PAP SMEAR COMMENT: 0

## 2015-08-26 ENCOUNTER — Telehealth: Payer: Self-pay

## 2015-08-26 NOTE — Telephone Encounter (Signed)
Patient advised as directed below. Patient verbalized understanding and agrees with plan of care.  

## 2015-08-26 NOTE — Telephone Encounter (Signed)
Patient advised as directed below. Patient is requesting lab results from 08/19/2015.

## 2015-08-26 NOTE — Telephone Encounter (Signed)
-----   Message from Margo Common, [place] sent at 08/26/2015 11:49 AM EST ----- Blood tests are all normal except slight elevation of LDL cholesterol. Continue low fat diet and Omega-3 Fish Oil or Krill Oil qd. Recheck annually. Last Vitamin-D level was normal at 33.7. Continue same vitamin-D and calcium supplements to keep bones strong.

## 2015-08-26 NOTE — Telephone Encounter (Signed)
-----   Message from Margo Common, [place] sent at 08/26/2015  8:50 AM EST ----- Normal PAP smear. No signs of cancer cells. Repeat test in 3 years. Continue annual physical exam.

## 2015-09-07 ENCOUNTER — Ambulatory Visit
Admission: RE | Admit: 2015-09-07 | Discharge: 2015-09-07 | Disposition: A | Discharge status: Home / Self Care | Payer: BLUE CROSS/BLUE SHIELD | Source: Ambulatory Visit | Attending: Family Medicine | Admitting: Family Medicine

## 2015-09-07 DIAGNOSIS — Z13820 Encounter for screening for osteoporosis: Secondary | ICD-10-CM | POA: Insufficient documentation

## 2015-09-07 DIAGNOSIS — M8580 Other specified disorders of bone density and structure, unspecified site: Secondary | ICD-10-CM | POA: Insufficient documentation

## 2015-09-09 NOTE — Telephone Encounter (Signed)
Bone density test shows slight osteopenia not osteoporosis yet. Encouraged to continue vitamin-D and Calcium supplements. Recheck BMD in 2 years.

## 2015-09-10 NOTE — Telephone Encounter (Signed)
Lmtcb, there are lab results in the chart also-aa

## 2015-09-15 NOTE — Telephone Encounter (Signed)
Patient advised as directed below. Patient verbalized understanding.  

## 2015-11-08 ENCOUNTER — Other Ambulatory Visit: Payer: Self-pay | Admitting: Family Medicine

## 2015-12-20 ENCOUNTER — Encounter: Payer: Self-pay | Admitting: Family Medicine

## 2015-12-20 ENCOUNTER — Ambulatory Visit (INDEPENDENT_AMBULATORY_CARE_PROVIDER_SITE_OTHER): Payer: BLUE CROSS/BLUE SHIELD | Admitting: Family Medicine

## 2015-12-20 VITALS — BP 130/72 | HR 58 | Temp 98.4°F | Resp 16 | Wt 191.0 lb

## 2015-12-20 DIAGNOSIS — J302 Other seasonal allergic rhinitis: Secondary | ICD-10-CM | POA: Diagnosis not present

## 2015-12-20 DIAGNOSIS — J0100 Acute maxillary sinusitis, unspecified: Secondary | ICD-10-CM

## 2015-12-20 MED ORDER — PREDNISONE 5 MG PO TABS: ORAL_TABLET | ORAL | Status: DC

## 2015-12-20 MED ORDER — AZITHROMYCIN 250 MG PO TABS: ORAL_TABLET | ORAL | Status: DC

## 2015-12-20 NOTE — Progress Notes (Signed)
Patient ID: Bridget Green, female   DOB: 01-Mar-1956, 60 y.o.   MRN: [id number]       [name] Female    DOB: Oct 08, 1955   60 y.o.   MRN: [id number] Visit Date: 12/20/2015  Today's Provider: Vernie Murders, PA   Chief Complaint  Patient presents with  . Sinusitis    X 2 weeks.    Subjective:    Sinusitis This is a recurrent problem. The current episode started 1 to 4 weeks ago. The problem is unchanged. There has been no fever. She is experiencing no pain. Associated symptoms include congestion, coughing, headaches, sinus pressure and sneezing. Past treatments include acetaminophen, oral decongestants and saline sprays. The treatment provided mild relief.    No past medical history on file. Patient Active Problem List   Diagnosis Date Noted  . Esophagitis, reflux 03/23/2015  . Herpes zona 03/23/2015  . Tendon nodule 03/23/2015  . Avitaminosis D 03/23/2015  . Ache in joint 08/04/2014  . Restless leg 11/16/2009  . Blood in feces 11/09/2008  . Asthma, exogenous 04/10/2008  . Essential (primary) hypertension 01/07/2007  . Breathlessness on exertion 09/11/2006  . Acid reflux 03/30/2006  . Hypercholesterolemia without hypertriglyceridemia 03/30/2006   Past Surgical History  Procedure Laterality Date  . Lasik Bilateral 2008  . Cesarean section  [id number]  . Appendectomy  1983  . Cholecystectomy  12/2011  . Tonsillectomy  1960  . Adenoidectomy  1960   Family History  Problem Relation Age of Onset  . Arthritis Mother   . Hypertension Mother   . Hyperlipidemia Mother   . Colon cancer Father   . Arthritis Sister   . Hypertension Sister   . Heart attack Maternal Grandfather   . Heart attack Paternal Grandmother   . Thyroid cancer Paternal Grandmother   . Aneurysm Paternal Grandfather    Allergies  Allergen Reactions  . Penicillins Itching  . Sulfa Antibiotics Itching   Current Meds  Medication Sig  . cholecalciferol (VITAMIN D) 1000 UNITS tablet  Take 1 tablet by mouth daily.  . fluticasone (FLONASE) 50 MCG/ACT nasal spray INSTILL 2 SPRAYS INTO EACH NOSTRIL AT BEDTIME  . lisinopril (PRINIVIL,ZESTRIL) 5 MG tablet TAKE ONE TABLET BY MOUTH EVERY DAY  . meloxicam (MOBIC) 15 MG tablet TAKE ONE TABLET BY MOUTH EVERY DAY  . MULTIPLE VITAMINS PO Take 1 tablet by mouth daily.  . Omega-3 Fatty Acids (FISH OIL) 1200 MG CAPS Take 2 capsules by mouth daily.  . Omeprazole 20 MG TBEC Take 1 tablet by mouth daily.  Marland Kitchen ZETIA 10 MG tablet TAKE ONE TABLET BY MOUTH EVERY DAY    Review of Systems  HENT: Positive for congestion, sinus pressure and sneezing.   Respiratory: Positive for cough.   Neurological: Positive for headaches.    Social History  Substance Use Topics  . Smoking status: Never Smoker   . Smokeless tobacco: Never Used  . Alcohol Use: 0.0 oz/week    0 Standard drinks or equivalent per week     Comment: OCCASIONALLY   Objective:   BP 130/72 mmHg  Pulse 58  Temp(Src) 98.4 F (36.9 C)  Resp 16  Wt 191 lb (86.637 kg)  SpO2 99%  Physical Exam  Constitutional: She is oriented to person, place, and time. She appears well-developed and well-nourished. No distress.  HENT:  Head: Normocephalic and atraumatic.  Right Ear: Hearing and external ear normal.  Left Ear: Hearing and external ear normal.  Nose: Nose normal.  Cobblestone appearance  of posterior pharynx. No transillumination of left maxillary sinus and poor transillumination on the right. Slight tenderness bilaterally.  Eyes: Conjunctivae and lids are normal. Right eye exhibits no discharge. Left eye exhibits no discharge. No scleral icterus.  Neck: Neck supple.  Cardiovascular: Normal rate and regular rhythm.   Pulmonary/Chest: Effort normal and breath sounds normal. No respiratory distress.  Musculoskeletal: Normal range of motion.  Lymphadenopathy:    She has no cervical adenopathy.  Neurological: She is alert and oriented to person, place, and time.  Skin: Skin is  intact. No lesion and no rash noted.  Psychiatric: She has a normal mood and affect. Her speech is normal and behavior is normal. Thought content normal.      Assessment & Plan:     1. Acute maxillary sinusitis, recurrence not specified Onset over the past week. Started after persistent allergic rhinitis sneezing, rhinorrhea and watery eyes. Has worsening pressure in sinuses with no transillumination. Will treat with antibiotic and prednisone taper. Continue Mucinex and may restart nasal steroid spray to control allergies. Recheck prn. - azithromycin (ZITHROMAX) 250 MG tablet; Take 2 tablets by mouth today then one daily for 4 days.  Dispense: 6 tablet; Refill: 0 - predniSONE (DELTASONE) 5 MG tablet; Taper by one tablet daily as directed (6,5,4,3,2,1)  Dispense: 21 tablet; Refill: 0  2. Other seasonal allergic rhinitis Sneezing and watery eyes with rhinorrhea for the past month. Worsening after motorcycle ride yesterday. Treat with antihistamine and prednisone taper. Recheck prn. - predniSONE (DELTASONE) 5 MG tablet; Taper by one tablet daily as directed (6,5,4,3,2,1)  Dispense: 21 tablet; Refill: [place]  [facility]

## 2016-02-21 ENCOUNTER — Encounter: Payer: Self-pay | Admitting: Family Medicine

## 2016-02-21 ENCOUNTER — Ambulatory Visit (INDEPENDENT_AMBULATORY_CARE_PROVIDER_SITE_OTHER): Payer: BLUE CROSS/BLUE SHIELD | Admitting: Family Medicine

## 2016-02-21 VITALS — BP 142/80 | HR 70 | Temp 97.7°F | Resp 16 | Wt 192.0 lb

## 2016-02-21 DIAGNOSIS — J0101 Acute recurrent maxillary sinusitis: Secondary | ICD-10-CM | POA: Diagnosis not present

## 2016-02-21 MED ORDER — DOXYCYCLINE HYCLATE 100 MG PO TABS: 100.0000 mg | ORAL_TABLET | Freq: Two times a day (BID) | ORAL | 0 refills | Status: DC

## 2016-02-21 NOTE — Progress Notes (Signed)
[name] Female    DOB: 08/06/1955   60 y.o.   MRN: [id number] Visit Date: 02/21/2016  Today's Provider: Vernie Murders, PA   Chief Complaint  Patient presents with  . Sinus Problem   Subjective:    HPI Pt is here today for a possible sinus infection. She reports that she has sinus pain, pressure and headache. She can not get much out of her nose to tell if she has any color to her mucus. She reports that it started about a week ago. Denies fever, chills, or sweats. She does have a cough that has kept her up at night. She had a sinus infection a couple of months ago ans she reports that she does not think she ever really got over it.     Allergies  Allergen Reactions  . Penicillins Itching  . Sulfa Antibiotics Itching   Current Meds  Medication Sig  . cholecalciferol (VITAMIN D) 1000 UNITS tablet Take 1 tablet by mouth daily.  . fluticasone (FLONASE) 50 MCG/ACT nasal spray INSTILL 2 SPRAYS INTO EACH NOSTRIL AT BEDTIME  . lisinopril (PRINIVIL,ZESTRIL) 5 MG tablet TAKE ONE TABLET BY MOUTH EVERY DAY  . meloxicam (MOBIC) 15 MG tablet TAKE ONE TABLET BY MOUTH EVERY DAY  . MULTIPLE VITAMINS PO Take 1 tablet by mouth daily.  . Omega-3 Fatty Acids (FISH OIL) 1200 MG CAPS Take 2 capsules by mouth daily.  . Omeprazole 20 MG TBEC Take 1 tablet by mouth daily.  Marland Kitchen ZETIA 10 MG tablet TAKE ONE TABLET BY MOUTH EVERY DAY    Review of Systems  Constitutional: Positive for fatigue.  HENT: Positive for congestion, postnasal drip, rhinorrhea and sinus pressure.   Eyes: Negative.   Respiratory: Positive for cough.   Cardiovascular: Negative.   Gastrointestinal: Negative.   Endocrine: Negative.   Genitourinary: Negative.   Musculoskeletal: Negative.   Skin: Negative.   Allergic/Immunologic: Negative.   Neurological: Negative.   Hematological: Negative.   Psychiatric/Behavioral: Negative.     Social History  Substance Use Topics  . Smoking status: Never Smoker  .  Smokeless tobacco: Never Used  . Alcohol use 0.0 oz/week     Comment: OCCASIONALLY   Objective:   BP (!) 142/80 (BP Location: Right Arm, Patient Position: Sitting, Cuff Size: Large)   Pulse 70   Temp 97.7 F (36.5 C) (Oral)   Resp 16   Wt 192 lb (87.1 kg)   SpO2 98%    Wt Readings from Last 3 Encounters:  02/21/16 192 lb (87.1 kg)  12/20/15 191 lb (86.6 kg)  08/19/15 188 lb (85.3 kg)   BP Readings from Last 3 Encounters:  02/21/16 (!) 142/80  12/20/15 130/72  08/19/15 132/70   Physical Exam  Constitutional: She is oriented to person, place, and time. She appears well-nourished.  HENT:  Head: Normocephalic.  Right Ear: External ear normal.  Left Ear: External ear normal.  Cobblestone appearance of posterior pharynx. Wears hearing aids bilaterally. No significant tenderness of sinuses. No transillumination of maxillary sinuses.  Neck: Neck supple.  Cardiovascular: Normal rate and regular rhythm.   Pulmonary/Chest: Breath sounds normal.  Abdominal: Soft. Bowel sounds are normal.  Neurological: She is alert and oriented to person, place, and time.  Skin: No rash noted.      Assessment & Plan:     1. Recurrent maxillary sinusitis, unspecified chronicity Recurrence over the past week similar to episode in June 2017. May restart Flonase and Mucinex. Will  switch to Doxycycline sine Z-pak not much help 2 months ago. May need CT scan if no better in 7-10 days. - doxycycline (VIBRA-TABS) 100 MG tablet; Take 1 tablet (100 mg total) by mouth 2 (two) times daily.  Dispense: 20 tablet; Refill: [place], PA  [facility]

## 2016-02-29 DIAGNOSIS — H02403 Unspecified ptosis of bilateral eyelids: Secondary | ICD-10-CM | POA: Diagnosis not present

## 2016-02-29 DIAGNOSIS — H524 Presbyopia: Secondary | ICD-10-CM | POA: Diagnosis not present

## 2016-04-19 DIAGNOSIS — Z23 Encounter for immunization: Secondary | ICD-10-CM | POA: Diagnosis not present

## 2016-05-02 ENCOUNTER — Other Ambulatory Visit: Payer: Self-pay | Admitting: Family Medicine

## 2016-05-02 DIAGNOSIS — Z1231 Encounter for screening mammogram for malignant neoplasm of breast: Secondary | ICD-10-CM

## 2016-05-18 ENCOUNTER — Other Ambulatory Visit: Payer: Self-pay | Admitting: Family Medicine

## 2016-05-18 ENCOUNTER — Ambulatory Visit
Admission: RE | Admit: 2016-05-18 | Discharge: 2016-05-18 | Disposition: A | Discharge status: Home / Self Care | Payer: BLUE CROSS/BLUE SHIELD | Source: Ambulatory Visit | Attending: Family Medicine | Admitting: Family Medicine

## 2016-05-18 DIAGNOSIS — Z1231 Encounter for screening mammogram for malignant neoplasm of breast: Secondary | ICD-10-CM | POA: Insufficient documentation

## 2016-05-19 ENCOUNTER — Telehealth: Payer: Self-pay

## 2016-05-19 NOTE — Telephone Encounter (Signed)
-----   Message from Margo Common, [place] sent at 05/19/2016 11:47 AM EST ----- Normal mammogram. Repeat in a year.

## 2016-05-19 NOTE — Telephone Encounter (Signed)
Patient has been advised. KW 

## 2016-06-16 DIAGNOSIS — H903 Sensorineural hearing loss, bilateral: Secondary | ICD-10-CM | POA: Diagnosis not present

## 2016-07-15 ENCOUNTER — Other Ambulatory Visit: Payer: Self-pay | Admitting: Physician Assistant

## 2016-07-15 DIAGNOSIS — E7800 Pure hypercholesterolemia, unspecified: Secondary | ICD-10-CM

## 2016-07-29 ENCOUNTER — Other Ambulatory Visit: Payer: Self-pay | Admitting: Family Medicine

## 2016-07-29 DIAGNOSIS — J309 Allergic rhinitis, unspecified: Secondary | ICD-10-CM

## 2016-08-22 ENCOUNTER — Ambulatory Visit (INDEPENDENT_AMBULATORY_CARE_PROVIDER_SITE_OTHER): Payer: BLUE CROSS/BLUE SHIELD | Admitting: Family Medicine

## 2016-08-22 ENCOUNTER — Encounter: Payer: Self-pay | Admitting: Family Medicine

## 2016-08-22 VITALS — BP 170/100 | HR 81 | Temp 97.7°F | Resp 16 | Ht 64.0 in | Wt 192.4 lb

## 2016-08-22 DIAGNOSIS — E7800 Pure hypercholesterolemia, unspecified: Secondary | ICD-10-CM | POA: Diagnosis not present

## 2016-08-22 DIAGNOSIS — I10 Essential (primary) hypertension: Secondary | ICD-10-CM

## 2016-08-22 DIAGNOSIS — H9193 Unspecified hearing loss, bilateral: Secondary | ICD-10-CM | POA: Diagnosis not present

## 2016-08-22 DIAGNOSIS — K219 Gastro-esophageal reflux disease without esophagitis: Secondary | ICD-10-CM | POA: Diagnosis not present

## 2016-08-22 DIAGNOSIS — Z0000 Encounter for general adult medical examination without abnormal findings: Secondary | ICD-10-CM

## 2016-08-22 DIAGNOSIS — Z114 Encounter for screening for human immunodeficiency virus [HIV]: Secondary | ICD-10-CM | POA: Diagnosis not present

## 2016-08-22 MED ORDER — LISINOPRIL 10 MG PO TABS: 10.0000 mg | ORAL_TABLET | Freq: Every day | ORAL | 3 refills | Status: DC

## 2016-08-22 MED ORDER — OMEPRAZOLE 20 MG PO TBEC: 1.0000 | DELAYED_RELEASE_TABLET | Freq: Every day | ORAL | 3 refills | Status: DC

## 2016-08-22 NOTE — Patient Instructions (Signed)

## 2016-08-22 NOTE — Progress Notes (Signed)
[name], Female    DOB: April 14, 1956, 61 y.o.   MRN: [id number] Visit Date: 08/22/2016  Today's Provider: Vernie Murders, PA   Chief Complaint  Patient presents with  . Annual Exam   Subjective:    Annual physical exam Bridget Green is a 61 y.o. female who presents today for health maintenance and complete physical. She feels well. She reports exercising none. She reports she is sleeping well.  -----------------------------------------------------------------   Review of Systems  Constitutional: Negative.   HENT: Negative.   Eyes: Negative.   Respiratory: Negative.   Cardiovascular: Negative.   Gastrointestinal: Negative.   Endocrine: Negative.   Genitourinary: Negative.   Musculoskeletal: Negative.   Skin: Negative.   Allergic/Immunologic: Negative.   Neurological: Negative.   Hematological: Negative.   Psychiatric/Behavioral: Negative.     Social History      She  reports that she has never smoked. She has never used smokeless tobacco. She reports that she drinks alcohol. She reports that she does not use drugs.       Social History   Social History  . Marital status: Married    Spouse name: N/A  . Number of children: N/A  . Years of education: N/A   Social History Main Topics  . Smoking status: Never Smoker  . Smokeless tobacco: Never Used  . Alcohol use 0.0 oz/week     Comment: OCCASIONALLY  . Drug use: No  . Sexual activity: Not Asked   Other Topics Concern  . None   Social History Narrative  . None    Patient Active Problem List   Diagnosis Date Noted  . Esophagitis, reflux 03/23/2015  . Herpes zona 03/23/2015  . Tendon nodule 03/23/2015  . Avitaminosis D 03/23/2015  . Ache in joint 08/04/2014  . Restless leg 11/16/2009  . Disequilibrium 02/22/2009  . Blood in feces 11/09/2008  . Hematochezia 11/09/2008  . Asthma, exogenous 04/10/2008  . Essential (primary) hypertension 01/07/2007  . Breathlessness on exertion  09/11/2006  . Acid reflux 03/30/2006  . Hypercholesterolemia without hypertriglyceridemia 03/30/2006    Past Surgical History:  Procedure Laterality Date  . ADENOIDECTOMY  1960  . APPENDECTOMY  1983  . CESAREAN SECTION  [id number]  . CHOLECYSTECTOMY  12/2011  . LASIK Bilateral 2008  . TONSILLECTOMY  1960    Family History        Family Status  Relation Status  . Father Deceased at age 40  . Sister Alive  . Maternal Grandmother Deceased at age 3  . Maternal Grandfather Deceased  . Paternal Grandmother Deceased  . Paternal Grandfather Deceased  . Mother   . Neg Hx         Her family history includes Aneurysm in her paternal grandfather; Arthritis in her mother and sister; Colon cancer in her father; Heart attack in her maternal grandfather and paternal grandmother; Hyperlipidemia in her mother; Hypertension in her mother and sister; Thyroid cancer in her paternal grandmother.     Allergies  Allergen Reactions  . Penicillins Itching  . Sulfa Antibiotics Itching     Current Outpatient Prescriptions:  .  cholecalciferol (VITAMIN D) 1000 UNITS tablet, Take 1 tablet by mouth daily., Disp: , Rfl:  .  ezetimibe (ZETIA) 10 MG tablet, TAKE ONE TABLET BY MOUTH EVERY DAY, Disp: 90 tablet, Rfl: 3 .  fluticasone (FLONASE) 50 MCG/ACT nasal spray, INSTILL 2 SPRAYS INTO EACH NOSTRIL AT BEDTIME, Disp: 16 g, Rfl: 3 .  lisinopril (PRINIVIL,ZESTRIL)  5 MG tablet, TAKE ONE TABLET BY MOUTH EVERY DAY, Disp: 90 tablet, Rfl: 3 .  MULTIPLE VITAMINS PO, Take 1 tablet by mouth daily., Disp: , Rfl:  .  Omega-3 Fatty Acids (FISH OIL) 1200 MG CAPS, Take 2 capsules by mouth daily., Disp: , Rfl:  .  Omeprazole 20 MG TBEC, Take 1 tablet by mouth daily., Disp: , Rfl:  .  meloxicam (MOBIC) 15 MG tablet, TAKE ONE TABLET BY MOUTH EVERY DAY (Patient not taking: Reported on 08/22/2016), Disp: 30 tablet, Rfl: 6   Patient Care Team: Margo Common, PA as PCP - General (Physician Assistant)      Objective:    Vitals: BP (!) 170/100 (BP Location: Right Arm, Patient Position: Sitting, Cuff Size: Normal)   Pulse 81   Temp 97.7 F (36.5 C) (Oral)   Resp 16   Ht 5\' 4"  (1.626 m)   Wt 192 lb 6.4 oz (87.3 kg)   SpO2 99%   BMI 33.03 kg/m   Wt Readings from Last 3 Encounters:  08/22/16 192 lb 6.4 oz (87.3 kg)  02/21/16 192 lb (87.1 kg)  12/20/15 191 lb (86.6 kg)   BP Readings from Last 3 Encounters:  08/22/16 (!) 170/100  02/21/16 (!) 142/80  12/20/15 130/72    Physical Exam  Constitutional: She is oriented to person, place, and time. She appears well-developed and well-nourished.  HENT:  Head: Normocephalic and atraumatic.  Right Ear: External ear normal.  Left Ear: External ear normal.  Nose: Nose normal.  Mouth/Throat: Oropharynx is clear and moist.  Eyes: Conjunctivae and EOM are normal. Pupils are equal, round, and reactive to light. Right eye exhibits no discharge.  Neck: Normal range of motion. Neck supple. No tracheal deviation present. No thyromegaly present.  Cardiovascular: Normal rate, regular rhythm, normal heart sounds and intact distal pulses.   No murmur heard. Pulmonary/Chest: Effort normal and breath sounds normal. No respiratory distress. She has no wheezes. She has no rales. She exhibits no tenderness.  Abdominal: Soft. Bowel sounds are normal. She exhibits no distension and no mass. There is no tenderness. There is no rebound and no guarding.  Musculoskeletal: Normal range of motion. She exhibits no edema or tenderness.  Lymphadenopathy:    She has no cervical adenopathy.  Neurological: She is alert and oriented to person, place, and time. She has normal reflexes. No cranial nerve deficit. She exhibits normal muscle tone. Coordination normal.  Skin: Skin is warm and dry. No rash noted. No erythema.  Psychiatric: She has a normal mood and affect. Her behavior is normal. Judgment and thought content normal.   Depression Screen PHQ 2/9 Scores 08/19/2015  PHQ - 2 Score  0    Assessment & Plan:     Routine Health Maintenance and Physical Exam  Exercise Activities and Dietary recommendations Goals    No specific exercise program. Recommend walking 20-30 minutes 4-5 days a week.      Immunization History  Administered Date(s) Administered  . Tdap 03/26/2015  . Zoster 11/27/2014    Health Maintenance  Topic Date Due  . HIV Screening  03/17/1969  . MAMMOGRAM  05/18/2018  . PAP SMEAR  08/18/2018  . COLONOSCOPY  08/28/2018  . TETANUS/TDAP  03/25/2025  . INFLUENZA VACCINE  Completed  . ZOSTAVAX  Completed  . Hepatitis C Screening  Completed     Discussed health benefits of physical activity, and encouraged her to engage in regular exercise appropriate for her age and condition.    --------------------------------------------------------------------  1. Annual physical exam Good general health. Need to lose 10% of her weight. Will restart walking exercise with warmer weather. Immunizations up to date. Normal PAP smears the past 3 years. Normal mammogram in Nov. 2017. Given anticipatory counseling. Recheck routine labs.  2. Essential (primary) hypertension No chest pains, dizziness or dyspnea. BP high today despite regular dosage of Lisinopril. Will check routine labs and increase Lisinopril to 10 mg qd. Recheck pending report of labs. - CBC with Differential/Platelet - Comprehensive metabolic panel - lisinopril (PRINIVIL,ZESTRIL) 10 MG tablet; Take 1 tablet (10 mg total) by mouth daily.  Dispense: 90 tablet; Refill: 3  3. Hypercholesterolemia without hypertriglyceridemia Tolerating Zetia 10 mg qd and trying to control diet. Recommend she restart exercise program of walking daily. Recheck labs and follow up pending report. - Comprehensive metabolic panel - Lipid panel - TSH  4. Screening for HIV (human immunodeficiency virus) - HIV antibody  5. Gastroesophageal reflux disease without esophagitis Dyspepsia frequently recurrent especially  with fried foods. Trying to change diet. Still having good control of symptoms with use of Omeprazole 20 mg qd. No melena, hematochezia or hematemesis. Will refill meds and consider upper endoscopy in the near future if persistently worsening.  6. Bilateral hearing loss, unspecified hearing loss type Has new hearing aids and much improved.    Vernie Murders, PA  [facility]

## 2016-08-23 LAB — CBC WITH DIFFERENTIAL/PLATELET
BASOS: 0 %
Basophils Absolute: 0 10*3/uL (ref 0.0–0.2)
EOS (ABSOLUTE): 0.1 10*3/uL (ref 0.0–0.4)
EOS: 1 %
Hematocrit: 43 % (ref 34.0–46.6)
Hemoglobin: 14.3 g/dL (ref 11.1–15.9)
IMMATURE GRANS (ABS): 0 10*3/uL (ref 0.0–0.1)
IMMATURE GRANULOCYTES: 0 %
LYMPHS: 53 %
Lymphocytes Absolute: 3.6 10*3/uL — ABNORMAL HIGH (ref 0.7–3.1)
MCH: 30 pg (ref 26.6–33.0)
MCHC: 33.3 g/dL (ref 31.5–35.7)
MCV: 90 fL (ref 79–97)
MONOS ABS: 0.4 10*3/uL (ref 0.1–0.9)
Monocytes: 6 %
NEUTROS PCT: 40 %
Neutrophils Absolute: 2.7 10*3/uL (ref 1.4–7.0)
PLATELETS: 259 10*3/uL (ref 150–379)
RBC: 4.76 x10E6/uL (ref 3.77–5.28)
RDW: 13.6 % (ref 12.3–15.4)
WBC: 6.8 10*3/uL (ref 3.4–10.8)

## 2016-08-23 LAB — COMPREHENSIVE METABOLIC PANEL
ALT: 31 IU/L (ref 0–32)
AST: 21 IU/L (ref 0–40)
Albumin/Globulin Ratio: 1.8 (ref 1.2–2.2)
Albumin: 4.4 g/dL (ref 3.6–4.8)
Alkaline Phosphatase: 96 IU/L (ref 39–117)
BUN/Creatinine Ratio: 16 (ref 12–28)
BUN: 11 mg/dL (ref 8–27)
Bilirubin Total: 0.4 mg/dL (ref 0.0–1.2)
CALCIUM: 9.5 mg/dL (ref 8.7–10.3)
CO2: 20 mmol/L (ref 18–29)
CREATININE: 0.68 mg/dL (ref 0.57–1.00)
Chloride: 102 mmol/L (ref 96–106)
GFR, EST AFRICAN AMERICAN: 108 mL/min/{1.73_m2} (ref >59)
GFR, EST NON AFRICAN AMERICAN: 94 mL/min/{1.73_m2} (ref >59)
Globulin, Total: 2.4 g/dL (ref 1.5–4.5)
Glucose: 93 mg/dL (ref 65–99)
POTASSIUM: 4.7 mmol/L (ref 3.5–5.2)
Sodium: 143 mmol/L (ref 134–144)
TOTAL PROTEIN: 6.8 g/dL (ref 6.0–8.5)

## 2016-08-23 LAB — LIPID PANEL
Chol/HDL Ratio: 2.9 ratio units (ref 0.0–4.4)
Cholesterol, Total: 194 mg/dL (ref 100–199)
HDL: 68 mg/dL (ref >39)
LDL CALC: 102 mg/dL — AB (ref 0–99)
TRIGLYCERIDES: 119 mg/dL (ref 0–149)
VLDL CHOLESTEROL CAL: 24 mg/dL (ref 5–40)

## 2016-08-23 LAB — TSH: TSH: 0.634 u[IU]/mL (ref 0.450–4.500)

## 2016-08-23 LAB — HIV ANTIBODY (ROUTINE TESTING W REFLEX): HIV Screen 4th Generation wRfx: NONREACTIVE

## 2016-08-25 ENCOUNTER — Telehealth: Payer: Self-pay

## 2016-08-25 NOTE — Telephone Encounter (Signed)
Patient has been advised. KW 

## 2016-08-25 NOTE — Telephone Encounter (Signed)
-----   Message from Margo Common, [place] sent at 08/25/2016 11:29 AM EST ----- All blood tests essentially normal. Cholesterol much improved. Continue present medications and diet with exercise. Recheck annually.

## 2016-08-29 DIAGNOSIS — L570 Actinic keratosis: Secondary | ICD-10-CM | POA: Diagnosis not present

## 2016-08-29 DIAGNOSIS — D225 Melanocytic nevi of trunk: Secondary | ICD-10-CM | POA: Diagnosis not present

## 2016-11-01 ENCOUNTER — Ambulatory Visit (INDEPENDENT_AMBULATORY_CARE_PROVIDER_SITE_OTHER): Payer: BLUE CROSS/BLUE SHIELD | Admitting: Podiatry

## 2016-11-01 ENCOUNTER — Encounter: Payer: Self-pay | Admitting: Podiatry

## 2016-11-01 VITALS — BP 191/82 | HR 68 | Resp 16

## 2016-11-01 DIAGNOSIS — L600 Ingrowing nail: Secondary | ICD-10-CM | POA: Diagnosis not present

## 2016-11-01 MED ORDER — NEOMYCIN-POLYMYXIN-HC 1 % OT SOLN: OTIC | 1 refills | Status: DC

## 2016-11-01 NOTE — Patient Instructions (Signed)

## 2016-11-01 NOTE — Progress Notes (Signed)
   Subjective:    Patient ID: Bridget Green, female    DOB: 1956-04-26, 61 y.o.   MRN: [id number]  HPI: She presents today with chief complaint of a painful ingrown toenail to the lateral border of the hallux right. She states it is then abscess like this now for a few weeks is red and swollen and hurts like crazy she's done nothing to try to help.    Review of Systems  All other systems reviewed and are negative.      Objective:   Physical Exam: Vital signs are stable alert and oriented 3. Pulses are palpable. Neurologic sensory was intact. The 2 reflexes are intact. Muscle strength is 5 over 5 dorsiflexion plantar flexors and inverters everters all just musculature is intact. Orthopedic evaluation due inserts all joints distally for range of motion but crepitation. Cutaneous evaluation of Mr. supple hydrated cutis sharp and rate no marginal. More of the hallux right with an abscess. There is purulence there is malodor. No streaking or cellulitic process is noted.        Assessment & Plan:  Ingrown toenail paronychia chest. We will border hallux right.  Plan: Discussed etiology pathology concerning for surgical therapies. We performed a chemical matrixectomy today after local anesthesia was administered. She tolerated the procedure well. She was provided with both oral and written home going instructions for care and soaking of her toe. She is also provided with a prescription for Kerlix were noted to be applied twice daily after soaking. I will follow-up with her in 2 weeks.

## 2016-11-06 ENCOUNTER — Ambulatory Visit (INDEPENDENT_AMBULATORY_CARE_PROVIDER_SITE_OTHER): Payer: BLUE CROSS/BLUE SHIELD | Admitting: Family Medicine

## 2016-11-06 ENCOUNTER — Encounter: Payer: Self-pay | Admitting: Family Medicine

## 2016-11-06 VITALS — BP 172/96 | HR 70 | Temp 97.9°F | Wt 191.2 lb

## 2016-11-06 DIAGNOSIS — I10 Essential (primary) hypertension: Secondary | ICD-10-CM | POA: Diagnosis not present

## 2016-11-06 MED ORDER — LISINOPRIL-HYDROCHLOROTHIAZIDE 20-12.5 MG PO TABS: 1.0000 | ORAL_TABLET | Freq: Every day | ORAL | 3 refills | Status: DC

## 2016-11-06 NOTE — Patient Instructions (Signed)

## 2016-11-06 NOTE — Progress Notes (Signed)
[name] Female    DOB: [birth year]   61 y.o.   MRN: [id number] Visit Date: 11/06/2016  Today's Provider: Vernie Murders, PA   Chief Complaint  Patient presents with  . Hypertension  . Follow-up   Subjective:    HPI  Hypertension, follow-up:  BP Readings from Last 3 Encounters:  11/06/16 (!) 172/96  11/01/16 (!) 191/82  08/22/16 (!) 170/100    She was last seen for hypertension 2 months ago.  BP at that visit was 170/100. Management changes since that visit include increased Lisinopril to 10 mg. She reports good compliance with treatment. She is not having side effects.  She is exercising. Joined weight loss program yesterday. She is adherent to low salt diet.   Outside blood pressures are being checked. She is experiencing dizziness this morning.  Patient denies chest pain, chest pressure/discomfort, irregular heart beat and palpitations.   Cardiovascular risk factors include dyslipidemia and hypertension.  Use of agents associated with hypertension: none.     Weight trend: stable Wt Readings from Last 3 Encounters:  11/06/16 191 lb 3.2 oz (86.7 kg)  08/22/16 192 lb 6.4 oz (87.3 kg)  02/21/16 192 lb (87.1 kg)    Current diet: in general, a "healthy" diet    ------------------------------------------------------------------------ Patient Active Problem List   Diagnosis Date Noted  . Esophagitis, reflux 03/23/2015  . Herpes zona 03/23/2015  . Tendon nodule 03/23/2015  . Avitaminosis D 03/23/2015  . Ache in joint 08/04/2014  . Restless leg 11/16/2009  . Disequilibrium 02/22/2009  . Blood in feces 11/09/2008  . Hematochezia 11/09/2008  . Asthma, exogenous 04/10/2008  . Essential (primary) hypertension 01/07/2007  . Breathlessness on exertion 09/11/2006  . Acid reflux 03/30/2006  . Hypercholesterolemia without hypertriglyceridemia 03/30/2006   Past Surgical History:  Procedure Laterality Date  . ADENOIDECTOMY  1960  . APPENDECTOMY  1983    . CESAREAN SECTION  [id number]  . CHOLECYSTECTOMY  12/2011  . LASIK Bilateral 2008  . TONSILLECTOMY  1960   Family History  Problem Relation Age of Onset  . Colon cancer Father   . Arthritis Sister   . Hypertension Sister   . Heart attack Maternal Grandfather   . Heart attack Paternal Grandmother   . Thyroid cancer Paternal Grandmother   . Aneurysm Paternal Grandfather   . Arthritis Mother   . Hypertension Mother   . Hyperlipidemia Mother   . Breast cancer Neg Hx    Allergies  Allergen Reactions  . Penicillins Itching  . Sulfa Antibiotics Itching     Previous Medications   CHOLECALCIFEROL (VITAMIN D) 1000 UNITS TABLET    Take 1 tablet by mouth daily.   EZETIMIBE (ZETIA) 10 MG TABLET    TAKE ONE TABLET BY MOUTH EVERY DAY   FLUTICASONE (FLONASE) 50 MCG/ACT NASAL SPRAY    INSTILL 2 SPRAYS INTO EACH NOSTRIL AT BEDTIME   LISINOPRIL (PRINIVIL,ZESTRIL) 5 MG TABLET       MELOXICAM (MOBIC) 15 MG TABLET    TAKE ONE TABLET BY MOUTH EVERY DAY   MULTIPLE VITAMINS PO    Take 1 tablet by mouth daily.   NEOMYCIN-POLYMYXIN-HYDROCORTISONE (CORTISPORIN) 1 % SOLN OTIC SOLUTION    Apply 1-2 drops to toe BID after soaking   OMEGA-3 FATTY ACIDS (FISH OIL) 1200 MG CAPS    Take 2 capsules by mouth daily.   OMEPRAZOLE (PRILOSEC) 20 MG CAPSULE        Review of Systems  Constitutional: Negative.   Respiratory: Negative.  Cardiovascular: Negative.   Neurological: Positive for dizziness.    Social History  Substance Use Topics  . Smoking status: Never Smoker  . Smokeless tobacco: Never Used  . Alcohol use 0.0 oz/week     Comment: OCCASIONALLY   Objective:   BP (!) 172/96 (BP Location: Right Arm, Patient Position: Sitting, Cuff Size: Normal)   Pulse 70   Temp 97.9 F (36.6 C) (Oral)   Wt 191 lb 3.2 oz (86.7 kg)   SpO2 99%   BMI 32.82 kg/m   Physical Exam  Constitutional: She is oriented to person, place, and time. She appears well-developed and well-nourished.  HENT:  Head:  Normocephalic.  Eyes: Conjunctivae are normal.  Neck: Neck supple.  Cardiovascular: Normal rate and regular rhythm.   Pulmonary/Chest: Effort normal and breath sounds normal.  Neurological: She is alert and oriented to person, place, and time.  Psychiatric: She has a normal mood and affect. Her behavior is normal. Thought content normal.      Assessment & Plan:     1. Essential (primary) hypertension Has found BP staying higher with some weight gain. Tried to increase Lisinopril from 10 mg qd to 20 mg qd and still high today. Will add HCTZ and recheck levels in 2 weeks. - lisinopril-hydrochlorothiazide (ZESTORETIC) 20-12.5 MG tablet; Take 1 tablet by mouth daily.  Dispense: 90 tablet; Refill: 3

## 2016-11-15 ENCOUNTER — Ambulatory Visit (INDEPENDENT_AMBULATORY_CARE_PROVIDER_SITE_OTHER): Payer: BLUE CROSS/BLUE SHIELD | Admitting: Podiatry

## 2016-11-15 DIAGNOSIS — L600 Ingrowing nail: Secondary | ICD-10-CM

## 2016-11-15 NOTE — Progress Notes (Signed)
She presents today for follow-up of matrixectomy tibial border hallux right. She states this doing a lot better as a matter fact is doing great she's had no drainage. She continues to second Betadine and apply Cortisporin otic twice daily.  Objective: Vital signs are stable she's alert and oriented 3 toe appears to be healing well and that there is no erythema cellulitis drainage or odor.  Assessment: Well-healing matrixectomy hallux right.  Plan: Discontinue Betadine start with Epsom salts and warm water soaks covered during the day and leave open at bedtime. Continue soaks until completely resolved.

## 2016-11-15 NOTE — Patient Instructions (Signed)

## 2016-11-18 ENCOUNTER — Encounter: Payer: Self-pay | Admitting: Emergency Medicine

## 2016-11-18 ENCOUNTER — Emergency Department: Payer: BLUE CROSS/BLUE SHIELD

## 2016-11-18 ENCOUNTER — Emergency Department
Admission: EM | Admit: 2016-11-18 | Discharge: 2016-11-18 | Disposition: A | Discharge status: Home / Self Care | Payer: BLUE CROSS/BLUE SHIELD | Attending: Emergency Medicine | Admitting: Emergency Medicine

## 2016-11-18 DIAGNOSIS — Y92012 Bathroom of single-family (private) house as the place of occurrence of the external cause: Secondary | ICD-10-CM | POA: Insufficient documentation

## 2016-11-18 DIAGNOSIS — Y93E1 Activity, personal bathing and showering: Secondary | ICD-10-CM | POA: Diagnosis not present

## 2016-11-18 DIAGNOSIS — S6992XA Unspecified injury of left wrist, hand and finger(s), initial encounter: Secondary | ICD-10-CM | POA: Diagnosis not present

## 2016-11-18 DIAGNOSIS — S40012A Contusion of left shoulder, initial encounter: Secondary | ICD-10-CM

## 2016-11-18 DIAGNOSIS — S4992XA Unspecified injury of left shoulder and upper arm, initial encounter: Secondary | ICD-10-CM | POA: Diagnosis not present

## 2016-11-18 DIAGNOSIS — Y998 Other external cause status: Secondary | ICD-10-CM | POA: Insufficient documentation

## 2016-11-18 DIAGNOSIS — M25532 Pain in left wrist: Secondary | ICD-10-CM | POA: Diagnosis not present

## 2016-11-18 DIAGNOSIS — S63502A Unspecified sprain of left wrist, initial encounter: Secondary | ICD-10-CM | POA: Diagnosis not present

## 2016-11-18 DIAGNOSIS — S63592A Other specified sprain of left wrist, initial encounter: Secondary | ICD-10-CM | POA: Insufficient documentation

## 2016-11-18 DIAGNOSIS — J45909 Unspecified asthma, uncomplicated: Secondary | ICD-10-CM | POA: Insufficient documentation

## 2016-11-18 DIAGNOSIS — S60912A Unspecified superficial injury of left wrist, initial encounter: Secondary | ICD-10-CM | POA: Diagnosis present

## 2016-11-18 DIAGNOSIS — I10 Essential (primary) hypertension: Secondary | ICD-10-CM | POA: Diagnosis not present

## 2016-11-18 DIAGNOSIS — W182XXA Fall in (into) shower or empty bathtub, initial encounter: Secondary | ICD-10-CM | POA: Insufficient documentation

## 2016-11-18 DIAGNOSIS — S0003XA Contusion of scalp, initial encounter: Secondary | ICD-10-CM | POA: Diagnosis not present

## 2016-11-18 DIAGNOSIS — M25512 Pain in left shoulder: Secondary | ICD-10-CM | POA: Diagnosis not present

## 2016-11-18 MED ORDER — KETOROLAC TROMETHAMINE 60 MG/2ML IM SOLN
30.0000 mg | Freq: Once | INTRAMUSCULAR | Status: AC
  Administered 2016-11-18: 30 mg via INTRAMUSCULAR

## 2016-11-18 MED ORDER — KETOROLAC TROMETHAMINE 30 MG/ML IJ SOLN
INTRAMUSCULAR | Status: AC
  Filled 2016-11-18: qty 1

## 2016-11-18 NOTE — Discharge Instructions (Signed)
Please take ibuprofen 600 mg 3 times daily 7 days with food as needed for pain. Wear Velcro wrist brace to help with left wrist pain. Follow-up with orthopedics if no improvement in 5-7 days.

## 2016-11-18 NOTE — ED Notes (Addendum)
See triage note, pt reports falling in shower around 1900. Pt reports left forearm, wrist and shoulder pain. Pt able to move fingers at this time. Pt also reports finger tingling to both hands. Pt states she also hit her head, denies LOC or N/V. Hematoma noted to right forehead. Pt A&O at this time.

## 2016-11-18 NOTE — ED Triage Notes (Signed)
Pt ambulatory to triage with steady gait, holding right arm. Pt reports a mechanical fall while in bathtub at approximately 1900. Pt sts she hit head, and landed on right arm. Pt c/o of right arm pain from wrist to shoulder. Pt denies LOC.

## 2016-11-18 NOTE — ED Provider Notes (Signed)
[facility] Provider Note   CSN: [id number] Arrival date & time: 11/18/16  2114     History   Chief Complaint Chief Complaint  Patient presents with  . Fall    HPI Bridget Green is a 61 y.o. female presents to the emergency department for evaluation of left wrist, left shoulder pain. Patient states around 7 PM tonight, she was getting out of the shower when she slipped and fell, she hit her head, right frontal region and landed on her left shoulder, left wrist. She denies having a headache, losing consciousness, any nausea or vomiting, vision changes. No dizziness. Her husband states she has been acting normal with no signs of confusion. She did develop some swelling along the scalp but again denies any headaches or discomfort. She complains mostly of left wrist, left shoulder pain. Left wrist pain is 6 out of 10, left shoulder pain is 3 out of 10. She is not on any blood thinners. She has not had any medications for pain. No neck, lower back, chest, abdominal pain. She is ambulatory with no assisted devices and denies any lower extremity discomfort.  HPI  History reviewed. No pertinent past medical history.  Patient Active Problem List   Diagnosis Date Noted  . Esophagitis, reflux 03/23/2015  . Herpes zona 03/23/2015  . Tendon nodule 03/23/2015  . Avitaminosis D 03/23/2015  . Ache in joint 08/04/2014  . Restless leg 11/16/2009  . Disequilibrium 02/22/2009  . Blood in feces 11/09/2008  . Hematochezia 11/09/2008  . Asthma, exogenous 04/10/2008  . Essential (primary) hypertension 01/07/2007  . Breathlessness on exertion 09/11/2006  . Acid reflux 03/30/2006  . Hypercholesterolemia without hypertriglyceridemia 03/30/2006    Past Surgical History:  Procedure Laterality Date  . ADENOIDECTOMY  1960  . APPENDECTOMY  1983  . CESAREAN SECTION  [id number]  . CHOLECYSTECTOMY  12/2011  . LASIK Bilateral 2008  . TONSILLECTOMY  1960    OB History    No data  available       Home Medications    Prior to Admission medications   Medication Sig Start Date End Date Taking? Authorizing Provider  cholecalciferol (VITAMIN D) 1000 UNITS tablet Take 1 tablet by mouth daily.    [provider]  ezetimibe (ZETIA) 10 MG tablet TAKE ONE TABLET BY MOUTH EVERY DAY 07/17/16   Chrismon, Vickki Muff, PA  fluticasone (FLONASE) 50 MCG/ACT nasal spray INSTILL 2 SPRAYS INTO EACH NOSTRIL AT BEDTIME 07/31/16   Chrismon, Vickki Muff, PA  lisinopril-hydrochlorothiazide (ZESTORETIC) 20-12.5 MG tablet Take 1 tablet by mouth daily. 11/06/16   Chrismon, Vickki Muff, PA  meloxicam (MOBIC) 15 MG tablet TAKE ONE TABLET BY MOUTH EVERY DAY Patient not taking: Reported on 11/06/2016 05/18/16   Chrismon, Vickki Muff, PA  MULTIPLE VITAMINS PO Take 1 tablet by mouth daily.    [provider]  NEOMYCIN-POLYMYXIN-HYDROCORTISONE (CORTISPORIN) 1 % SOLN otic solution Apply 1-2 drops to toe BID after soaking 11/01/16   Hyatt, Max T, DPM  Omega-3 Fatty Acids (FISH OIL) 1200 MG CAPS Take 2 capsules by mouth daily. 11/09/08   [provider]  omeprazole (PRILOSEC) 20 MG capsule  08/22/16   [provider]    Family History Family History  Problem Relation Age of Onset  . Colon cancer Father   . Arthritis Sister   . Hypertension Sister   . Heart attack Maternal Grandfather   . Heart attack Paternal Grandmother   . Thyroid cancer Paternal Grandmother   . Aneurysm Paternal Grandfather   .  Arthritis Mother   . Hypertension Mother   . Hyperlipidemia Mother   . Breast cancer Neg Hx     Social History Social History  Substance Use Topics  . Smoking status: Never Smoker  . Smokeless tobacco: Never Used  . Alcohol use 0.0 oz/week     Comment: OCCASIONALLY     Allergies   Penicillins and Sulfa antibiotics   Review of Systems Review of Systems  Constitutional: Negative for activity change, chills, fatigue and fever.  HENT: Negative for congestion, sinus pressure  and sore throat.   Eyes: Negative for visual disturbance.  Respiratory: Negative for cough, chest tightness and shortness of breath.   Cardiovascular: Negative for chest pain and leg swelling.  Gastrointestinal: Negative for abdominal pain, diarrhea, nausea and vomiting.  Genitourinary: Negative for dysuria.  Musculoskeletal: Positive for arthralgias, joint swelling and myalgias. Negative for gait problem and neck pain.  Skin: Negative for rash.  Neurological: Negative for dizziness, weakness, numbness and headaches.  Hematological: Negative for adenopathy.  Psychiatric/Behavioral: Negative for agitation, behavioral problems and confusion.     Physical Exam Updated Vital Signs BP (!) 189/69 (BP Location: Right Arm)   Pulse 99   Temp 97.8 F (36.6 C) (Oral)   Resp 16   Ht 5\' 4"  (1.626 m)   Wt 84.4 kg   SpO2 98%   BMI 31.93 kg/m   Physical Exam  Constitutional: She is oriented to person, place, and time. She appears well-developed and well-nourished. No distress.  HENT:  Head: Normocephalic and atraumatic.  Right Ear: External ear normal.  Left Ear: External ear normal.  Nose: Nose normal.  Mouth/Throat: Oropharynx is clear and moist.  Mild hematoma to the right frontal region of the scalp. No tenderness to palpation. No bleeding.  Eyes: Conjunctivae and EOM are normal. Pupils are equal, round, and reactive to light. Right eye exhibits no discharge. Left eye exhibits no discharge.  Neck: Normal range of motion. Neck supple.  Cardiovascular: Normal rate, regular rhythm and intact distal pulses.   Pulmonary/Chest: Effort normal and breath sounds normal. No respiratory distress. She exhibits no tenderness.  Abdominal: Soft. She exhibits no distension. There is no tenderness.  Musculoskeletal: Normal range of motion. She exhibits no edema.  Nontender along the cervical, thoracic, lumbar spinous process. She has full range of motion of the cervical thoracic and lumbar spine.  Nontender throughout the left clavicle but has tenderness to the proximal humerus to palpation. She has painful active range of motion of left shoulder. She has no pain and full range of motion of the left elbow. She is tender to palpation throughout the left wrist with positive swelling, no significant deformity. No scaphoid tenderness. Sensation is intact distally. 2+ radial pulse.  Neurological: She is alert and oriented to person, place, and time. She has normal reflexes. No cranial nerve deficit. Coordination normal.  Skin: Skin is warm and dry.  Psychiatric: She has a normal mood and affect. Her behavior is normal. Judgment and thought content normal.     ED Treatments / Results  Labs (all labs ordered are listed, but only abnormal results are displayed) Labs Reviewed - No data to display  EKG  EKG Interpretation None       Radiology Dg Wrist Complete Left  Result Date: 11/18/2016 CLINICAL DATA:  Status post fall in shower, with left wrist pain. Initial encounter. EXAM: LEFT WRIST - COMPLETE 3+ VIEW COMPARISON:  None. FINDINGS: There is no evidence of fracture or dislocation. The carpal rows  are intact, and demonstrate normal alignment. The joint spaces are preserved. No significant soft tissue abnormalities are seen. IMPRESSION: No evidence of fracture or dislocation. Electronically Signed   By: Garald Balding M.D.   On: 11/18/2016 23:09   Dg Shoulder Left  Result Date: 11/18/2016 CLINICAL DATA:  Status post fall in shower, with left shoulder pain. Initial encounter. EXAM: LEFT SHOULDER - 2+ VIEW COMPARISON:  None. FINDINGS: There is no evidence of fracture or dislocation. The left humeral head is seated within the glenoid fossa. The acromioclavicular joint is unremarkable in appearance. No significant soft tissue abnormalities are seen. The visualized portions of the left lung are clear. IMPRESSION: No evidence of fracture or dislocation Electronically Signed   By: Garald Balding  M.D.   On: 11/18/2016 23:08    Procedures Procedures (including critical care time) SPLINT APPLICATION Date/Time: 54:09 PM Authorized by: Feliberto Gottron Consent: Verbal consent obtained. Risks and benefits: risks, benefits and alternatives were discussed Consent given by: patient Splint applied by: ED tech Location details: Left wrist  Splint type: A left Velcro wrist  Supplies used: Left Velcro wrist  Post-procedure: The splinted body part was neurovascularly unchanged following the procedure. Patient tolerance: Patient tolerated the procedure well with no immediate complications.    Medications Ordered in ED Medications  ketorolac (TORADOL) injection 30 mg (30 mg Intramuscular Given 11/18/16 2207)     Initial Impression / Assessment and Plan / ED Course  I have reviewed the triage vital signs and the nursing notes.  Pertinent labs & imaging results that were available during my care of the patient were reviewed by me and considered in my medical decision making (see chart for details).     61 year old female with left wrist sprain, left shoulder contusion, head contusion. She is educated on signs and symptoms to return to the ED for. She is placed into a Velcro wrist brace. Ibuprofen as needed for pain.  Final Clinical Impressions(s) / ED Diagnoses   Final diagnoses:  Contusion of scalp, initial encounter  Wrist sprain, left, initial encounter  Contusion of left shoulder, initial encounter    New Prescriptions New Prescriptions   No medications on file     Renata Caprice 11/18/16 2321    Gregor Hams, MD 11/18/16 2351

## 2016-11-20 ENCOUNTER — Encounter: Payer: Self-pay | Admitting: Family Medicine

## 2016-11-20 ENCOUNTER — Ambulatory Visit (INDEPENDENT_AMBULATORY_CARE_PROVIDER_SITE_OTHER): Payer: BLUE CROSS/BLUE SHIELD | Admitting: Family Medicine

## 2016-11-20 VITALS — BP 128/72 | HR 80 | Temp 98.3°F | Resp 16 | Wt 188.0 lb

## 2016-11-20 DIAGNOSIS — T07XXXA Unspecified multiple injuries, initial encounter: Secondary | ICD-10-CM | POA: Diagnosis not present

## 2016-11-20 MED ORDER — TRAMADOL HCL 50 MG PO TABS: 50.0000 mg | ORAL_TABLET | Freq: Three times a day (TID) | ORAL | 0 refills | Status: DC | PRN

## 2016-11-20 NOTE — Progress Notes (Signed)
[name] Female    DOB: 03-26-1956   61 y.o.   MRN: [id number] Visit Date: 11/20/2016  Today's Provider: Vernie Murders, PA   Chief Complaint  Patient presents with  . Hospitalization Follow-up  . Fall   Subjective:    HPI  Follow Up ER Visit  Patient is here for ER follow up.  She was recently seen at [facility] for a fall on 11/18/2016. Treatment for this included Toradol injection She reports good compliance with treatment. She reports this condition is Improved. However, patient mentions that she is very sore from the fall. She also has numbness and tingling in the tips of her fingers.     Patient Active Problem List   Diagnosis Date Noted  . Esophagitis, reflux 03/23/2015  . Herpes zona 03/23/2015  . Tendon nodule 03/23/2015  . Avitaminosis D 03/23/2015  . Ache in joint 08/04/2014  . Restless leg 11/16/2009  . Disequilibrium 02/22/2009  . Blood in feces 11/09/2008  . Hematochezia 11/09/2008  . Asthma, exogenous 04/10/2008  . Essential (primary) hypertension 01/07/2007  . Breathlessness on exertion 09/11/2006  . Acid reflux 03/30/2006  . Hypercholesterolemia without hypertriglyceridemia 03/30/2006   Past Surgical History:  Procedure Laterality Date  . ADENOIDECTOMY  1960  . APPENDECTOMY  1983  . CESAREAN SECTION  [id number]  . CHOLECYSTECTOMY  12/2011  . LASIK Bilateral 2008  . TONSILLECTOMY  1960   Family History  Problem Relation Age of Onset  . Colon cancer Father   . Arthritis Sister   . Hypertension Sister   . Heart attack Maternal Grandfather   . Heart attack Paternal Grandmother   . Thyroid cancer Paternal Grandmother   . Aneurysm Paternal Grandfather   . Arthritis Mother   . Hypertension Mother   . Hyperlipidemia Mother   . Breast cancer Neg Hx    Allergies  Allergen Reactions  . Penicillins Itching  . Sulfa Antibiotics Itching    Current Outpatient Prescriptions:  .  cholecalciferol (VITAMIN D) 1000 UNITS tablet,  Take 1 tablet by mouth daily., Disp: , Rfl:  .  ezetimibe (ZETIA) 10 MG tablet, TAKE ONE TABLET BY MOUTH EVERY DAY, Disp: 90 tablet, Rfl: 3 .  fluticasone (FLONASE) 50 MCG/ACT nasal spray, INSTILL 2 SPRAYS INTO EACH NOSTRIL AT BEDTIME, Disp: 16 g, Rfl: 3 .  lisinopril-hydrochlorothiazide (ZESTORETIC) 20-12.5 MG tablet, Take 1 tablet by mouth daily., Disp: 90 tablet, Rfl: 3 .  MULTIPLE VITAMINS PO, Take 1 tablet by mouth daily., Disp: , Rfl:  .  NEOMYCIN-POLYMYXIN-HYDROCORTISONE (CORTISPORIN) 1 % SOLN otic solution, Apply 1-2 drops to toe BID after soaking, Disp: 10 mL, Rfl: 1 .  Omega-3 Fatty Acids (FISH OIL) 1200 MG CAPS, Take 2 capsules by mouth daily., Disp: , Rfl:  .  omeprazole (PRILOSEC) 20 MG capsule, , Disp: , Rfl: 2 .  meloxicam (MOBIC) 15 MG tablet, TAKE ONE TABLET BY MOUTH EVERY DAY (Patient not taking: Reported on 11/06/2016), Disp: 30 tablet, Rfl: 6  Review of Systems  Cardiovascular: Negative for chest pain, palpitations and leg swelling.  Musculoskeletal: Positive for arthralgias, back pain, joint swelling, myalgias and neck pain.  Skin: Positive for color change.  Neurological: Positive for weakness and numbness. Negative for dizziness.  Hematological: Bruises/bleeds easily.    Social History  Substance Use Topics  . Smoking status: Never Smoker  . Smokeless tobacco: Never Used  . Alcohol use 0.0 oz/week     Comment: OCCASIONALLY   Objective:   BP  128/72 (BP Location: Right Arm, Patient Position: Sitting, Cuff Size: Normal)   Pulse 80   Temp 98.3 F (36.8 C)   Resp 16   Wt 188 lb (85.3 kg)   SpO2 97%   BMI 32.27 kg/m  Vitals:   11/20/16 0858  BP: 128/72  Pulse: 80  Resp: 16  Temp: 98.3 F (36.8 C)  SpO2: 97%  Weight: 188 lb (85.3 kg)     Physical Exam  Constitutional: She is oriented to person, place, and time. She appears well-developed and well-nourished.  HENT:  Right Ear: External ear normal.  Left Ear: External ear normal.  Mouth/Throat:  Oropharynx is clear and moist.  Diminishing right frontal hematoma without black eyes. Some soreness. No "battle sign". Teeth in good alignment and none loose. No neck pain.  Eyes: Conjunctivae and EOM are normal. Pupils are equal, round, and reactive to light.  Neck: Normal range of motion. Neck supple.  Cardiovascular: Normal rate, regular rhythm and normal heart sounds.   Pulmonary/Chest: Effort normal and breath sounds normal.  Abdominal: Soft. Bowel sounds are normal.  Musculoskeletal: She exhibits tenderness.  Stiffness and ache in the hands and forearms (L>R). Good grip strength. Large bruise of the left buttock. Good ROM throughout. No abrasions.  Neurological: She is alert and oriented to person, place, and time. She has normal reflexes.      Assessment & Plan:     1. Multiple contusions Onset secondary to a fall in the bathtub at home on 11-18-16. States she was trying to step out of the tub when she fell. No LOC. Had a hematoma on the right forehead, pain in the left shoulder, both forearms (L>R) and a large bruise in the left buttock. No dizziness, vision changes, neck pain, headache, nausea or vomiting. Was evaluated at [facility] ER and all x-rays were negative for fractures. Has had minimal relief from use of Tylenol and a splint on the left wrist/forearm. Will give Tramadol for severe pains not relieved by Tylenol at bedtime. Recheck if no improvement in the next 5-7 days. - traMADol (ULTRAM) 50 MG tablet; Take 1 tablet (50 mg total) by mouth every 8 (eight) hours as needed.  Dispense: 30 tablet; Refill: [place], PA  [facility]

## 2016-11-20 NOTE — Patient Instructions (Signed)
Fall Prevention in the Home Falls can cause injuries and can affect people from all age groups. There are many simple things that you can do to make your home safe and to help prevent falls. What can I do on the outside of my home?  Regularly repair the edges of walkways and driveways and fix any cracks.  Remove high doorway thresholds.  Trim any shrubbery on the main path into your home.  Use bright outdoor lighting.  Clear walkways of debris and clutter, including tools and rocks.  Regularly check that handrails are securely fastened and in good repair. Both sides of any steps should have handrails.  Install guardrails along the edges of any raised decks or porches.  Have leaves, snow, and ice cleared regularly.  Use sand or salt on walkways during winter months.  In the garage, clean up any spills right away, including grease or oil spills. What can I do in the bathroom?  Use night lights.  Install grab bars by the toilet and in the tub and shower. Do not use towel bars as grab bars.  Use non-skid mats or decals on the floor of the tub or shower.  If you need to sit down while you are in the shower, use a plastic, non-slip stool.  Keep the floor dry. Immediately clean up any water that spills on the floor.  Remove soap buildup in the tub or shower on a regular basis.  Attach bath mats securely with double-sided non-slip rug tape.  Remove throw rugs and other tripping hazards from the floor. What can I do in the bedroom?  Use night lights.  Make sure that a bedside light is easy to reach.  Do not use oversized bedding that drapes onto the floor.  Have a firm chair that has side arms to use for getting dressed.  Remove throw rugs and other tripping hazards from the floor. What can I do in the kitchen?  Clean up any spills right away.  Avoid walking on wet floors.  Place frequently used items in easy-to-reach places.  If you need to reach for something above  you, use a sturdy step stool that has a grab bar.  Keep electrical cables out of the way.  Do not use floor polish or wax that makes floors slippery. If you have to use wax, make sure that it is non-skid floor wax.  Remove throw rugs and other tripping hazards from the floor. What can I do in the stairways?  Do not leave any items on the stairs.  Make sure that there are handrails on both sides of the stairs. Fix handrails that are broken or loose. Make sure that handrails are as long as the stairways.  Check any carpeting to make sure that it is firmly attached to the stairs. Fix any carpet that is loose or worn.  Avoid having throw rugs at the top or bottom of stairways, or secure the rugs with carpet tape to prevent them from moving.  Make sure that you have a light switch at the top of the stairs and the bottom of the stairs. If you do not have them, have them installed. What are some other fall prevention tips?  Wear closed-toe shoes that fit well and support your feet. Wear shoes that have rubber soles or low heels.  When you use a stepladder, make sure that it is completely opened and that the sides are firmly locked. Have someone hold the ladder while you are using   it. Do not climb a closed stepladder.  Add color or contrast paint or tape to grab bars and handrails in your home. Place contrasting color strips on the first and last steps.  Use mobility aids as needed, such as canes, walkers, scooters, and crutches.  Turn on lights if it is dark. Replace any light bulbs that burn out.  Set up furniture so that there are clear paths. Keep the furniture in the same spot.  Fix any uneven floor surfaces.  Choose a carpet design that does not hide the edge of steps of a stairway.  Be aware of any and all pets.  Review your medicines with your healthcare provider. Some medicines can cause dizziness or changes in blood pressure, which increase your risk of falling. Talk with  your health care provider about other ways that you can decrease your risk of falls. This may include working with a physical therapist or trainer to improve your strength, balance, and endurance. This information is not intended to replace advice given to you by your health care provider. Make sure you discuss any questions you have with your health care provider. Document Released: 06/16/2002 Document Revised: 11/23/2015 Document Reviewed: 07/31/2014 Elsevier Interactive Patient Education  2017 [employer] A contusion is a deep bruise. Contusions are the result of a blunt injury to tissues and muscle fibers under the skin. The injury causes bleeding under the skin. The skin overlying the contusion may turn blue, purple, or yellow. Minor injuries will give you a painless contusion, but more severe contusions may stay painful and swollen for a few weeks. What are the causes? This condition is usually caused by a blow, trauma, or direct force to an area of the body. What are the signs or symptoms? Symptoms of this condition include:  Swelling of the injured area.  Pain and tenderness in the injured area.  Discoloration. The area may have redness and then turn blue, purple, or yellow. How is this diagnosed? This condition is diagnosed based on a physical exam and medical history. An X-ray, CT scan, or MRI may be needed to determine if there are any associated injuries, such as broken bones (fractures). How is this treated? Specific treatment for this condition depends on what area of the body was injured. In general, the best treatment for a contusion is resting, icing, applying pressure to (compression), and elevating the injured area. This is often called the RICE strategy. Over-the-counter anti-inflammatory medicines may also be recommended for pain control. Follow these instructions at home:  Rest the injured area.  If directed, apply ice to the injured area:  Put ice in a  plastic bag.  Place a towel between your skin and the bag.  Leave the ice on for 20 minutes, 2-3 times per day.  If directed, apply light compression to the injured area using an elastic bandage. Make sure the bandage is not wrapped too tightly. Remove and reapply the bandage as directed by your health care provider.  If possible, raise (elevate) the injured area above the level of your heart while you are sitting or lying down.  Take over-the-counter and prescription medicines only as told by your health care provider. Contact a health care provider if:  Your symptoms do not improve after several days of treatment.  Your symptoms get worse.  You have difficulty moving the injured area. Get help right away if:  You have severe pain.  You have numbness in a hand or foot.  Your hand  or foot turns pale or cold. This information is not intended to replace advice given to you by your health care provider. Make sure you discuss any questions you have with your health care provider. Document Released: 04/05/2005 Document Revised: 11/04/2015 Document Reviewed: 11/11/2014 Elsevier Interactive Patient Education  2017 [employer].

## 2017-02-01 DIAGNOSIS — H52222 Regular astigmatism, left eye: Secondary | ICD-10-CM | POA: Diagnosis not present

## 2017-05-10 ENCOUNTER — Other Ambulatory Visit: Payer: Self-pay | Admitting: Family Medicine

## 2017-05-10 DIAGNOSIS — Z1231 Encounter for screening mammogram for malignant neoplasm of breast: Secondary | ICD-10-CM

## 2017-05-23 ENCOUNTER — Ambulatory Visit
Admission: RE | Admit: 2017-05-23 | Discharge: 2017-05-23 | Disposition: A | Discharge status: Home / Self Care | Payer: BLUE CROSS/BLUE SHIELD | Source: Ambulatory Visit | Attending: Family Medicine | Admitting: Family Medicine

## 2017-05-23 DIAGNOSIS — Z1231 Encounter for screening mammogram for malignant neoplasm of breast: Secondary | ICD-10-CM | POA: Diagnosis not present

## 2017-05-24 ENCOUNTER — Other Ambulatory Visit: Payer: Self-pay | Admitting: Family Medicine

## 2017-05-24 ENCOUNTER — Telehealth: Payer: Self-pay

## 2017-05-24 DIAGNOSIS — K219 Gastro-esophageal reflux disease without esophagitis: Secondary | ICD-10-CM

## 2017-05-24 NOTE — Telephone Encounter (Signed)
Patient advised as directed below.  Thanks,  -Joseline 

## 2017-05-24 NOTE — Telephone Encounter (Signed)
-----   Message from Margo Common, [place] sent at 05/24/2017  9:28 AM EST ----- Normal mammogram without evidence of malignancy. Radiologist recommended annual mammograms.

## 2017-07-13 ENCOUNTER — Other Ambulatory Visit: Payer: Self-pay | Admitting: Family Medicine

## 2017-07-13 DIAGNOSIS — E7800 Pure hypercholesterolemia, unspecified: Secondary | ICD-10-CM

## 2017-08-24 ENCOUNTER — Encounter: Payer: BLUE CROSS/BLUE SHIELD | Admitting: Family Medicine

## 2017-08-24 ENCOUNTER — Encounter: Payer: Self-pay | Admitting: Family Medicine

## 2017-08-24 ENCOUNTER — Ambulatory Visit (INDEPENDENT_AMBULATORY_CARE_PROVIDER_SITE_OTHER): Payer: BLUE CROSS/BLUE SHIELD | Admitting: Family Medicine

## 2017-08-24 VITALS — BP 132/76 | HR 77 | Temp 98.2°F | Ht 64.0 in | Wt 197.6 lb

## 2017-08-24 DIAGNOSIS — Z0000 Encounter for general adult medical examination without abnormal findings: Secondary | ICD-10-CM | POA: Diagnosis not present

## 2017-08-24 DIAGNOSIS — E7800 Pure hypercholesterolemia, unspecified: Secondary | ICD-10-CM | POA: Diagnosis not present

## 2017-08-24 DIAGNOSIS — E669 Obesity, unspecified: Secondary | ICD-10-CM

## 2017-08-24 DIAGNOSIS — I10 Essential (primary) hypertension: Secondary | ICD-10-CM

## 2017-08-24 NOTE — Progress Notes (Addendum)
[name], Female    DOB: [birth year], 62 y.o.   MRN: [id number] Visit Date: 08/24/2017  Today's Provider: Vernie Murders, PA   Chief Complaint  Patient presents with  . Annual Exam   Subjective:    Annual physical exam Bridget Green is a 62 y.o. female who presents today for health maintenance and complete physical. She feels fairly well. She reports upper respiratory symptoms since December. She reports exercising none. She reports she is sleeping well.  -----------------------------------------------------------------   Review of Systems  Constitutional: Negative.   HENT: Positive for postnasal drip, rhinorrhea and sneezing.   Eyes: Negative.   Respiratory: Negative.   Cardiovascular: Negative.   Gastrointestinal: Negative.   Endocrine: Negative.   Genitourinary: Negative.   Musculoskeletal: Negative.   Skin: Negative.   Allergic/Immunologic: Negative.   Neurological: Negative.   Hematological: Negative.   Psychiatric/Behavioral: Negative.     Social History      She  reports that  has never smoked. she has never used smokeless tobacco. She reports that she drinks alcohol. She reports that she does not use drugs.       Social History   Socioeconomic History  . Marital status: Married    Spouse name: None  . Number of children: None  . Years of education: None  . Highest education level: None  Social Needs  . Financial resource strain: None  . Food insecurity - worry: None  . Food insecurity - inability: None  . Transportation needs - medical: None  . Transportation needs - non-medical: None  Occupational History  . None  Tobacco Use  . Smoking status: Never Smoker  . Smokeless tobacco: Never Used  Substance and Sexual Activity  . Alcohol use: Yes    Alcohol/week: 0.0 oz    Comment: OCCASIONALLY  . Drug use: No  . Sexual activity: None  Other Topics Concern  . None  Social History Narrative  . None    History reviewed. No  pertinent past medical history.   Patient Active Problem List   Diagnosis Date Noted  . Esophagitis, reflux 03/23/2015  . Herpes zona 03/23/2015  . Tendon nodule 03/23/2015  . Avitaminosis D 03/23/2015  . Ache in joint 08/04/2014  . Restless leg 11/16/2009  . Disequilibrium 02/22/2009  . Blood in feces 11/09/2008  . Hematochezia 11/09/2008  . Asthma, exogenous 04/10/2008  . Essential (primary) hypertension 01/07/2007  . Breathlessness on exertion 09/11/2006  . Acid reflux 03/30/2006  . Hypercholesterolemia without hypertriglyceridemia 03/30/2006    Past Surgical History:  Procedure Laterality Date  . ADENOIDECTOMY  1960  . APPENDECTOMY  1983  . CESAREAN SECTION  [id number]  . CHOLECYSTECTOMY  12/2011  . LASIK Bilateral 2008  . [place] History        Family Status  Relation Name Status  . Father  Deceased at age 55  . Sister  Alive  . MGM  Deceased at age 71  . MGF  Deceased  . PGM  Deceased  . PGF  Deceased  . Mother  (Not Specified)  . Neg Hx  (Not Specified)        Her family history includes Aneurysm in her paternal grandfather; Arthritis in her mother and sister; Colon cancer in her father; Heart attack in her maternal grandfather and paternal grandmother; Hyperlipidemia in her mother; Hypertension in her mother and sister; Thyroid cancer in her paternal grandmother. There is no history of Breast cancer.  Allergies  Allergen Reactions  . Penicillins Itching  . Sulfa Antibiotics Itching    Current Outpatient Medications:  .  ezetimibe (ZETIA) 10 MG tablet, TAKE ONE TABLET BY MOUTH EVERY DAY, Disp: 90 tablet, Rfl: 3 .  lisinopril-hydrochlorothiazide (ZESTORETIC) 20-12.5 MG tablet, Take 1 tablet by mouth daily., Disp: 90 tablet, Rfl: 3 .  omeprazole (PRILOSEC) 20 MG capsule, TAKE ONE CAPSULE BY MOUTH EVERY DAY, Disp: 90 capsule, Rfl: 3   Patient Care Team: Chrismon, Vickki Muff, PA as PCP - General (Physician Assistant)     Objective:     Vitals: BP 132/76 (BP Location: Right Arm, Patient Position: Sitting, Cuff Size: Normal)   Pulse 77   Temp 98.2 F (36.8 C) (Oral)   Ht 5\' 4"  (1.626 m)   Wt 197 lb 9.6 oz (89.6 kg)   SpO2 99%   BMI 33.92 kg/m  Wt Readings from Last 3 Encounters:  08/24/17 197 lb 9.6 oz (89.6 kg)  11/20/16 188 lb (85.3 kg)  11/18/16 186 lb (84.4 kg)     Physical Exam  Constitutional: She is oriented to person, place, and time. She appears well-developed and well-nourished.  HENT:  Head: Normocephalic and atraumatic.  Right Ear: External ear normal.  Left Ear: External ear normal.  Nose: Nose normal.  Mouth/Throat: Oropharynx is clear and moist.  Significant hearing loss and uses hearing aids bilaterally.  Eyes: Conjunctivae and EOM are normal. Pupils are equal, round, and reactive to light. Right eye exhibits no discharge.  Neck: Normal range of motion. Neck supple. No tracheal deviation present. No thyromegaly present.  Cardiovascular: Normal rate, regular rhythm, normal heart sounds and intact distal pulses.  No murmur heard. Pulmonary/Chest: Effort normal and breath sounds normal. No respiratory distress. She has no wheezes. She has no rales. She exhibits no tenderness.  Abdominal: Soft. She exhibits no distension and no mass. There is no tenderness. There is no rebound and no guarding.  Musculoskeletal: Normal range of motion. She exhibits no edema or tenderness.  Lymphadenopathy:    She has no cervical adenopathy.  Neurological: She is alert and oriented to person, place, and time. She has normal reflexes. No cranial nerve deficit. She exhibits normal muscle tone. Coordination normal.  Skin: Skin is warm and dry. No rash noted. No erythema.  Psychiatric: She has a normal mood and affect. Her behavior is normal. Judgment and thought content normal.   Depression Screen PHQ 2/9 Scores 08/24/2017 08/19/2015  PHQ - 2 Score 0 0  PHQ- 9 Score 0 -   Assessment & Plan:     Routine Health  Maintenance and Physical Exam  Exercise Activities and Dietary recommendations Goals    Encouraged to lower caloric intake, drink 6-7 glasses of water daily and exercise 30 minutes 3-4 days a week.      Immunization History  Administered Date(s) Administered  . Influenza-Unspecified 05/10/2017  . Tdap 03/26/2015  . Zoster 11/27/2014    Health Maintenance  Topic Date Due  . PAP SMEAR  08/18/2018  . COLONOSCOPY  08/28/2018  . MAMMOGRAM  05/24/2019  . TETANUS/TDAP  03/25/2025  . INFLUENZA VACCINE  Completed  . Hepatitis C Screening  Completed  . HIV Screening  Completed    Discussed health benefits of physical activity, and encouraged her to engage in regular exercise appropriate for her age and condition.    --------------------------------------------------------------------  1. Annual physical exam Stable health with very poor hearing. Needs to lose weight. Last colonoscopy was in 2010 - negative for  malignancy. Last BMD normal in 06-13-13. Normal mammograms 05-23-17. Recheck routine labs. - CBC with Differential/Platelet - Comprehensive metabolic panel - Lipid panel - TSH - Hemoglobin A1c  2. Obesity, Class I, BMI 30-34.9 Has gained 9 lbs in the past year. Encouraged to lower caloric intake and exercise 30 minutes 4-5 days a week. Recheck labs for metabolic disorder and follow up pending lab reports. - CBC with Differential/Platelet - Comprehensive metabolic panel - Lipid panel - TSH - Hemoglobin A1c  3. Essential (primary) hypertension Well controlled ans stable on the Lisinopril-HCTZ 20-12.5 mg qd. No chest pains, hives, edema, palpitations or cough. Recheck routine labs. - CBC with Differential/Platelet - Comprehensive metabolic panel - TSH  4. Hypercholesterolemia without hypertriglyceridemia Continues to try to follow low fat diet and takes Zetia 10 mg qd without side effects. Recheck CMP, TSH and lipid panel. Follow up pending reports. - Comprehensive  metabolic panel - Lipid panel - TSH   Vernie Murders, PA  [facility]

## 2017-08-25 LAB — COMPREHENSIVE METABOLIC PANEL
ALK PHOS: 85 IU/L (ref 39–117)
ALT: 31 IU/L (ref 0–32)
AST: 21 IU/L (ref 0–40)
Albumin/Globulin Ratio: 1.5 (ref 1.2–2.2)
Albumin: 4.1 g/dL (ref 3.6–4.8)
BILIRUBIN TOTAL: 0.5 mg/dL (ref 0.0–1.2)
BUN/Creatinine Ratio: 15 (ref 12–28)
BUN: 11 mg/dL (ref 8–27)
CHLORIDE: 104 mmol/L (ref 96–106)
CO2: 23 mmol/L (ref 20–29)
Calcium: 9.8 mg/dL (ref 8.7–10.3)
Creatinine, Ser: 0.74 mg/dL (ref 0.57–1.00)
GFR calc Af Amer: 100 mL/min/{1.73_m2} (ref >59)
GFR calc non Af Amer: 86 mL/min/{1.73_m2} (ref >59)
GLUCOSE: 97 mg/dL (ref 65–99)
Globulin, Total: 2.7 g/dL (ref 1.5–4.5)
POTASSIUM: 4.3 mmol/L (ref 3.5–5.2)
Sodium: 142 mmol/L (ref 134–144)
Total Protein: 6.8 g/dL (ref 6.0–8.5)

## 2017-08-25 LAB — CBC WITH DIFFERENTIAL/PLATELET
Basophils Absolute: 0 10*3/uL (ref 0.0–0.2)
Basos: 0 %
EOS (ABSOLUTE): 0.1 10*3/uL (ref 0.0–0.4)
Eos: 1 %
Hematocrit: 43.4 % (ref 34.0–46.6)
Hemoglobin: 14.8 g/dL (ref 11.1–15.9)
Immature Grans (Abs): 0 10*3/uL (ref 0.0–0.1)
Immature Granulocytes: 0 %
LYMPHS ABS: 3.7 10*3/uL — AB (ref 0.7–3.1)
Lymphs: 53 %
MCH: 30.8 pg (ref 26.6–33.0)
MCHC: 34.1 g/dL (ref 31.5–35.7)
MCV: 90 fL (ref 79–97)
MONOS ABS: 0.4 10*3/uL (ref 0.1–0.9)
Monocytes: 6 %
NEUTROS ABS: 2.8 10*3/uL (ref 1.4–7.0)
Neutrophils: 40 %
PLATELETS: 256 10*3/uL (ref 150–379)
RBC: 4.81 x10E6/uL (ref 3.77–5.28)
RDW: 13.7 % (ref 12.3–15.4)
WBC: 7 10*3/uL (ref 3.4–10.8)

## 2017-08-25 LAB — HEMOGLOBIN A1C
Est. average glucose Bld gHb Est-mCnc: 126 mg/dL
HEMOGLOBIN A1C: 6 % — AB (ref 4.8–5.6)

## 2017-08-25 LAB — LIPID PANEL
CHOLESTEROL TOTAL: 200 mg/dL — AB (ref 100–199)
Chol/HDL Ratio: 2.8 ratio (ref 0.0–4.4)
HDL: 72 mg/dL (ref >39)
LDL Calculated: 101 mg/dL — ABNORMAL HIGH (ref 0–99)
Triglycerides: 134 mg/dL (ref 0–149)
VLDL CHOLESTEROL CAL: 27 mg/dL (ref 5–40)

## 2017-08-25 LAB — TSH: TSH: 0.691 u[IU]/mL (ref 0.450–4.500)

## 2017-08-28 ENCOUNTER — Telehealth: Payer: Self-pay

## 2017-08-28 DIAGNOSIS — D225 Melanocytic nevi of trunk: Secondary | ICD-10-CM | POA: Diagnosis not present

## 2017-08-28 NOTE — Telephone Encounter (Signed)
-----   Message from Margo Common, [place] sent at 08/27/2017  5:52 PM EST ----- Hgb A1C is in the prediabetic range and remainder of blood tests are normal - including normal blood sugar. Recommend reduced calorie diet and regular exercise to lose some weight. Recheck progress in 3 months.

## 2017-08-28 NOTE — Telephone Encounter (Signed)
Patient advised.KW 

## 2017-09-15 ENCOUNTER — Other Ambulatory Visit: Payer: Self-pay | Admitting: Family Medicine

## 2017-09-15 DIAGNOSIS — J309 Allergic rhinitis, unspecified: Secondary | ICD-10-CM

## 2017-11-12 ENCOUNTER — Other Ambulatory Visit: Payer: Self-pay | Admitting: Family Medicine

## 2017-11-12 DIAGNOSIS — I10 Essential (primary) hypertension: Secondary | ICD-10-CM

## 2017-12-07 ENCOUNTER — Ambulatory Visit: Payer: BLUE CROSS/BLUE SHIELD | Admitting: Family Medicine

## 2017-12-07 ENCOUNTER — Encounter: Payer: Self-pay | Admitting: Family Medicine

## 2017-12-07 VITALS — BP 132/60 | HR 70 | Temp 97.8°F | Wt 194.2 lb

## 2017-12-07 DIAGNOSIS — H61892 Other specified disorders of left external ear: Secondary | ICD-10-CM

## 2017-12-07 MED ORDER — NEOMYCIN-POLYMYXIN-HC 3.5-10000-1 OT SUSP: 2.0000 [drp] | Freq: Every day | OTIC | 0 refills | Status: DC

## 2017-12-07 NOTE — Progress Notes (Deleted)
       [name] Female    DOB: 1956-06-10   62 y.o.   MRN: [id number] Visit Date: 12/07/2017  Today's Provider: Vernie Murders, PA   No chief complaint on file.  Subjective:    HPI     Allergies  Allergen Reactions  . Penicillins Itching  . Sulfa Antibiotics Itching     Current Outpatient Medications:  .  ezetimibe (ZETIA) 10 MG tablet, TAKE ONE TABLET BY MOUTH EVERY DAY, Disp: 90 tablet, Rfl: 3 .  fluticasone (FLONASE) 50 MCG/ACT nasal spray, INSTILL 2 SPRAYS INTO EACH NOSTRIL AT BEDTIME, Disp: 16 g, Rfl: 3 .  lisinopril-hydrochlorothiazide (PRINZIDE,ZESTORETIC) 20-12.5 MG tablet, TAKE ONE TABLET BY MOUTH EVERY DAY, Disp: 90 tablet, Rfl: 3 .  omeprazole (PRILOSEC) 20 MG capsule, TAKE ONE CAPSULE BY MOUTH EVERY DAY, Disp: 90 capsule, Rfl: 3  Review of Systems  Constitutional: Negative.   Respiratory: Negative.   Cardiovascular: Negative.     Social History   Tobacco Use  . Smoking status: Never Smoker  . Smokeless tobacco: Never Used  Substance Use Topics  . Alcohol use: Yes    Alcohol/week: 0.0 oz    Comment: OCCASIONALLY   Objective:   There were no vitals taken for this visit.   Physical Exam      Assessment & Plan:           Vernie Murders, PA  [facility]

## 2017-12-07 NOTE — Progress Notes (Signed)
[name] Female    DOB: Nov 11, 1955   62 y.o.   MRN: [id number] Visit Date: 12/07/2017  Today's Provider: Vernie Murders, PA   Chief Complaint  Patient presents with  . URI   Subjective:    URI   This is a new problem. Episode onset: Monday. The problem has been unchanged. There has been no fever. Associated symptoms include coughing (mild) and sneezing. Ear pain: ear bleeding at night. Associated symptoms comments: Postnasal drainage . Treatments tried: Flonase  The treatment provided no relief.   History reviewed. No pertinent past medical history. Patient Active Problem List   Diagnosis Date Noted  . Esophagitis, reflux 03/23/2015  . Herpes zona 03/23/2015  . Tendon nodule 03/23/2015  . Avitaminosis D 03/23/2015  . Ache in joint 08/04/2014  . Restless leg 11/16/2009  . Disequilibrium 02/22/2009  . Blood in feces 11/09/2008  . Hematochezia 11/09/2008  . Asthma, exogenous 04/10/2008  . Essential (primary) hypertension 01/07/2007  . Breathlessness on exertion 09/11/2006  . Acid reflux 03/30/2006  . Hypercholesterolemia without hypertriglyceridemia 03/30/2006   Past Surgical History:  Procedure Laterality Date  . ADENOIDECTOMY  1960  . APPENDECTOMY  1983  . CESAREAN SECTION  [id number]  . CHOLECYSTECTOMY  12/2011  . LASIK Bilateral 2008  . TONSILLECTOMY  1960   Family History  Problem Relation Age of Onset  . Colon cancer Father   . Arthritis Sister   . Hypertension Sister   . Heart attack Maternal Grandfather   . Heart attack Paternal Grandmother   . Thyroid cancer Paternal Grandmother   . Aneurysm Paternal Grandfather   . Arthritis Mother   . Hypertension Mother   . Hyperlipidemia Mother   . Breast cancer Neg Hx    Allergies  Allergen Reactions  . Penicillins Itching  . Sulfa Antibiotics Itching    Current Outpatient Medications:  .  ezetimibe (ZETIA) 10 MG tablet, TAKE ONE TABLET BY MOUTH EVERY DAY, Disp: 90 tablet, Rfl: 3 .   fluticasone (FLONASE) 50 MCG/ACT nasal spray, INSTILL 2 SPRAYS INTO EACH NOSTRIL AT BEDTIME, Disp: 16 g, Rfl: 3 .  lisinopril-hydrochlorothiazide (PRINZIDE,ZESTORETIC) 20-12.5 MG tablet, TAKE ONE TABLET BY MOUTH EVERY DAY, Disp: 90 tablet, Rfl: 3 .  omeprazole (PRILOSEC) 20 MG capsule, TAKE ONE CAPSULE BY MOUTH EVERY DAY, Disp: 90 capsule, Rfl: 3  Review of Systems  Constitutional: Negative.   HENT: Positive for sneezing. Ear pain: ear bleeding at night.   Respiratory: Positive for cough (mild).   Cardiovascular: Negative.    Social History   Tobacco Use  . Smoking status: Never Smoker  . Smokeless tobacco: Never Used  Substance Use Topics  . Alcohol use: Yes    Alcohol/week: 0.0 oz    Comment: OCCASIONALLY   Objective:   BP 132/60 (BP Location: Right Arm, Patient Position: Sitting, Cuff Size: Normal)   Pulse 70   Temp 97.8 F (36.6 C) (Oral)   Wt 194 lb 3.2 oz (88.1 kg)   SpO2 97%   BMI 33.33 kg/m   Physical Exam  Constitutional: She is oriented to person, place, and time. She appears well-developed and well-nourished. No distress.  HENT:  Head: Normocephalic and atraumatic.  Right Ear: Hearing and external ear normal.  Left Ear: Hearing normal.  Nose: Nose normal.  Pinpoint raw spot superior posterior ear canal near TM. No TM redness or fluid lines.  Eyes: Conjunctivae and lids are normal. Right eye exhibits no discharge. Left eye exhibits no  discharge. No scleral icterus.  Cardiovascular: Normal rate.  Pulmonary/Chest: Effort normal and breath sounds normal. No respiratory distress.  Musculoskeletal: Normal range of motion.  Neurological: She is alert and oriented to person, place, and time.  Skin: Skin is intact. No lesion and no rash noted.  Psychiatric: She has a normal mood and affect. Her speech is normal and behavior is normal. Thought content normal.      Assessment & Plan:     1. Irritation of left external auditory canal Has noticed some dried blood on  hearing aid in the left ear in the mornings. No pain and denies use of Q-tip deeply into the canal. One pinpoint raw area on posterior canal. Will treat with cortisporin at night with cotton plug. Dry canal out with a Kleenex before putting hearing aids in the ears each morning. Recheck if no better in a week. May need evaluation by ENT. - neomycin-polymyxin-hydrocortisone (CORTISPORIN) 3.5-10000-1 OTIC suspension; Place 2 drops into both ears at bedtime.  Dispense: 10 mL; Refill: 0       Vernie Murders, PA  [facility]

## 2018-01-28 ENCOUNTER — Encounter: Payer: Self-pay | Admitting: Family Medicine

## 2018-01-28 ENCOUNTER — Ambulatory Visit: Payer: BLUE CROSS/BLUE SHIELD | Admitting: Family Medicine

## 2018-01-28 VITALS — BP 124/74 | HR 65 | Temp 98.0°F | Resp 16 | Wt 194.8 lb

## 2018-01-28 DIAGNOSIS — H60392 Other infective otitis externa, left ear: Secondary | ICD-10-CM | POA: Diagnosis not present

## 2018-01-28 NOTE — Progress Notes (Addendum)
  Subjective:     Patient ID: Bridget Green, female   DOB: [birth year], 62 y.o.   MRN: [id number] Chief Complaint  Patient presents with  . Otalgia    Patient comes in office today with complaints of left sided ear pain for one week, patient states that pain gradually got worse over the weekend and believes her sinuses is the cause. Patient describes pain as throbbing and states that she has been taking otc Motrin.    HPI Denies swimming, cold or allergy sx. States that a piece of her hearing aid had fallen into her ear at last audiology visit. It was felt at that time that it would extrude itself.  Review of Systems     Objective:   Physical Exam  Constitutional: She appears well-developed and well-nourished. No distress.  HENT:  Right ear canal patent with sclerotic appearing  T.M> Left ear canal mildly swollen with white circular f.b. Noted.T.M is partially visualized and appears mildly hyperemic.+ tragal tenderness       Assessment:    1. Other infective acute otitis externa of left ear: will resume Cortisporin otic.     Plan:    Will f/u with her ENT/audiologist for removal of f.b.

## 2018-01-28 NOTE — Patient Instructions (Signed)
Start the Cortisporin ear drops-4 drops to left ear 3-4 x day. May continue ibuprofen. Do f/u with your ENT/audiologist.

## 2018-01-28 NOTE — Progress Notes (Signed)
e

## 2018-01-29 DIAGNOSIS — H61892 Other specified disorders of left external ear: Secondary | ICD-10-CM | POA: Diagnosis not present

## 2018-01-29 DIAGNOSIS — H903 Sensorineural hearing loss, bilateral: Secondary | ICD-10-CM | POA: Diagnosis not present

## 2018-01-29 DIAGNOSIS — T162XXA Foreign body in left ear, initial encounter: Secondary | ICD-10-CM | POA: Diagnosis not present

## 2018-02-05 DIAGNOSIS — H903 Sensorineural hearing loss, bilateral: Secondary | ICD-10-CM | POA: Diagnosis not present

## 2018-02-07 ENCOUNTER — Other Ambulatory Visit: Payer: Self-pay | Admitting: Family Medicine

## 2018-02-07 DIAGNOSIS — E7800 Pure hypercholesterolemia, unspecified: Secondary | ICD-10-CM

## 2018-02-14 DIAGNOSIS — H5203 Hypermetropia, bilateral: Secondary | ICD-10-CM | POA: Diagnosis not present

## 2018-02-14 DIAGNOSIS — H52222 Regular astigmatism, left eye: Secondary | ICD-10-CM | POA: Diagnosis not present

## 2018-02-14 DIAGNOSIS — H524 Presbyopia: Secondary | ICD-10-CM | POA: Diagnosis not present

## 2018-04-19 DIAGNOSIS — Z23 Encounter for immunization: Secondary | ICD-10-CM | POA: Diagnosis not present

## 2018-05-06 ENCOUNTER — Other Ambulatory Visit: Payer: Self-pay | Admitting: Family Medicine

## 2018-05-06 DIAGNOSIS — K219 Gastro-esophageal reflux disease without esophagitis: Secondary | ICD-10-CM

## 2018-05-21 ENCOUNTER — Other Ambulatory Visit: Payer: Self-pay | Admitting: Family Medicine

## 2018-05-21 DIAGNOSIS — Z1231 Encounter for screening mammogram for malignant neoplasm of breast: Secondary | ICD-10-CM

## 2018-05-30 ENCOUNTER — Ambulatory Visit
Admission: RE | Admit: 2018-05-30 | Discharge: 2018-05-30 | Disposition: A | Discharge status: Home / Self Care | Payer: BLUE CROSS/BLUE SHIELD | Source: Ambulatory Visit | Attending: Family Medicine | Admitting: Family Medicine

## 2018-05-30 ENCOUNTER — Other Ambulatory Visit: Payer: Self-pay | Admitting: Family Medicine

## 2018-05-30 DIAGNOSIS — R928 Other abnormal and inconclusive findings on diagnostic imaging of breast: Secondary | ICD-10-CM

## 2018-05-30 DIAGNOSIS — Z1231 Encounter for screening mammogram for malignant neoplasm of breast: Secondary | ICD-10-CM | POA: Diagnosis not present

## 2018-06-11 DIAGNOSIS — H903 Sensorineural hearing loss, bilateral: Secondary | ICD-10-CM | POA: Diagnosis not present

## 2018-06-13 ENCOUNTER — Ambulatory Visit
Admission: RE | Admit: 2018-06-13 | Discharge: 2018-06-13 | Disposition: A | Discharge status: Home / Self Care | Payer: BLUE CROSS/BLUE SHIELD | Source: Ambulatory Visit | Attending: Family Medicine | Admitting: Family Medicine

## 2018-06-13 ENCOUNTER — Other Ambulatory Visit: Payer: Self-pay | Admitting: Family Medicine

## 2018-06-13 DIAGNOSIS — R928 Other abnormal and inconclusive findings on diagnostic imaging of breast: Secondary | ICD-10-CM

## 2018-06-13 DIAGNOSIS — N6489 Other specified disorders of breast: Secondary | ICD-10-CM | POA: Diagnosis not present

## 2018-06-20 ENCOUNTER — Ambulatory Visit
Admission: RE | Admit: 2018-06-20 | Discharge: 2018-06-20 | Disposition: A | Discharge status: Home / Self Care | Payer: BLUE CROSS/BLUE SHIELD | Source: Ambulatory Visit | Attending: Family Medicine | Admitting: Family Medicine

## 2018-06-20 DIAGNOSIS — R928 Other abnormal and inconclusive findings on diagnostic imaging of breast: Secondary | ICD-10-CM

## 2018-06-20 DIAGNOSIS — N6031 Fibrosclerosis of right breast: Secondary | ICD-10-CM | POA: Diagnosis not present

## 2018-06-20 DIAGNOSIS — N6489 Other specified disorders of breast: Secondary | ICD-10-CM | POA: Diagnosis not present

## 2018-06-20 HISTORY — PX: BREAST BIOPSY: SHX20

## 2018-06-24 ENCOUNTER — Telehealth: Payer: Self-pay | Admitting: *Deleted

## 2018-06-24 LAB — SURGICAL PATHOLOGY

## 2018-06-24 NOTE — Telephone Encounter (Signed)
If radiologist recommended a surgeon referral, would proceed with appointment to be sure lesion doesn't need removal to prevent progression to a malignancy.

## 2018-06-24 NOTE — Telephone Encounter (Signed)
Patient states she was advised she needs a referral to a surgeon, concerning lesions? Patient wanted Dennis's opinion regarding whether or not she should worry about seeing a surgeon right now, being that results were negative for malignancy? Please advise?

## 2018-06-25 NOTE — Telephone Encounter (Signed)
LMOVM for pt to return call 

## 2018-06-25 NOTE — Telephone Encounter (Signed)
Patient was advised. Patient wanted to know which surgeon Simona Huh would recommend? Please advise?

## 2018-06-25 NOTE — Telephone Encounter (Signed)
Will try to get someone locally that is in the network with her insurance. If she has a preference, we can try them first.

## 2018-06-28 ENCOUNTER — Other Ambulatory Visit: Payer: Self-pay | Admitting: Family Medicine

## 2018-06-28 DIAGNOSIS — N6310 Unspecified lump in the right breast, unspecified quadrant: Secondary | ICD-10-CM

## 2018-06-28 DIAGNOSIS — J309 Allergic rhinitis, unspecified: Secondary | ICD-10-CM

## 2018-06-28 NOTE — Telephone Encounter (Signed)
Patient was advised.  

## 2018-06-28 NOTE — Telephone Encounter (Signed)
Placed order for referral to general surgeon locally for excision of right breast mass.

## 2018-06-28 NOTE — Telephone Encounter (Signed)
Patient has no preference. Patient stated she wanted to see whomever Simona Huh recommended.

## 2018-07-09 ENCOUNTER — Encounter: Payer: Self-pay | Admitting: Family Medicine

## 2018-07-09 ENCOUNTER — Ambulatory Visit: Payer: BLUE CROSS/BLUE SHIELD | Admitting: Family Medicine

## 2018-07-09 VITALS — BP 138/70 | HR 65 | Temp 98.0°F | Resp 16 | Wt 198.0 lb

## 2018-07-09 DIAGNOSIS — R0781 Pleurodynia: Secondary | ICD-10-CM

## 2018-07-09 NOTE — Progress Notes (Signed)
[name] Female    DOB: 1956/03/18   62 y.o.   MRN: [id number] Visit Date: 07/09/2018  Today's Provider: Vernie Murders, PA   Left rib pain  Subjective:     HPI   Patient states she has had pain under left breast for 2 weeks. Patient states she hurts on her rib area. No lumps or bumps. Pain is intermittent.   History reviewed. No pertinent past medical history. Patient Active Problem List   Diagnosis Date Noted  . Esophagitis, reflux 03/23/2015  . Herpes zona 03/23/2015  . Tendon nodule 03/23/2015  . Avitaminosis D 03/23/2015  . Ache in joint 08/04/2014  . Restless leg 11/16/2009  . Disequilibrium 02/22/2009  . Blood in feces 11/09/2008  . Hematochezia 11/09/2008  . Asthma, exogenous 04/10/2008  . Essential (primary) hypertension 01/07/2007  . Breathlessness on exertion 09/11/2006  . Acid reflux 03/30/2006  . Hypercholesterolemia without hypertriglyceridemia 03/30/2006   Past Surgical History:  Procedure Laterality Date  . ADENOIDECTOMY  1960  . APPENDECTOMY  1983  . BREAST BIOPSY Right 06/20/2018   stereo for distortion path pending  . CESAREAN SECTION  [id number]  . CHOLECYSTECTOMY  12/2011  . LASIK Bilateral 2008  . TONSILLECTOMY  1960   Family History  Problem Relation Age of Onset  . Colon cancer Father   . Arthritis Sister   . Hypertension Sister   . Heart attack Maternal Grandfather   . Heart attack Paternal Grandmother   . Thyroid cancer Paternal Grandmother   . Aneurysm Paternal Grandfather   . Arthritis Mother   . Hypertension Mother   . Hyperlipidemia Mother   . Breast cancer Cousin    Allergies  Allergen Reactions  . Penicillins Itching  . Sulfa Antibiotics Itching    Current Outpatient Medications:  .  ezetimibe (ZETIA) 10 MG tablet, TAKE 1 TABLET BY MOUTH DAILY, Disp: 30 tablet, Rfl: 5 .  fluticasone (FLONASE) 50 MCG/ACT nasal spray, PLACE 2 SPRAYS INTO BOTH NOSTRILS AT BEDTIME, Disp: 16 g, Rfl: 3 .   lisinopril-hydrochlorothiazide (PRINZIDE,ZESTORETIC) 20-12.5 MG tablet, TAKE ONE TABLET BY MOUTH EVERY DAY, Disp: 90 tablet, Rfl: 3 .  omeprazole (PRILOSEC) 20 MG capsule, TAKE 1 CAPSULE BY MOUTH ONCE DAILY, Disp: 90 capsule, Rfl: 3  Review of Systems  Constitutional: Negative for appetite change, chills, fatigue and fever.  Respiratory: Negative for chest tightness and shortness of breath.   Cardiovascular: Negative for chest pain and palpitations.  Gastrointestinal: Negative for abdominal pain, nausea and vomiting.  Neurological: Negative for dizziness and weakness.   Social History   Tobacco Use  . Smoking status: Never Smoker  . Smokeless tobacco: Never Used  Substance Use Topics  . Alcohol use: Yes    Alcohol/week: 0.0 standard drinks    Comment: OCCASIONALLY     Objective:   BP 138/70 (BP Location: Right Arm, Patient Position: Sitting, Cuff Size: Large)   Pulse 65   Temp 98 F (36.7 C) (Oral)   Resp 16   Wt 198 lb (89.8 kg)   SpO2 97%   BMI 33.99 kg/m  Vitals:   07/09/18 1112  BP: 138/70  Pulse: 65  Resp: 16  Temp: 98 F (36.7 C)  TempSrc: Oral  SpO2: 97%  Weight: 198 lb (89.8 kg)   Physical Exam Constitutional:      General: She is not in acute distress.    Appearance: She is well-developed.  HENT:     Head: Normocephalic and atraumatic.  Right Ear: Hearing normal.     Left Ear: Hearing normal.     Nose: Nose normal.  Eyes:     General: Lids are normal. No scleral icterus.       Right eye: No discharge.        Left eye: No discharge.     Conjunctiva/sclera: Conjunctivae normal.  Cardiovascular:     Rate and Rhythm: Normal rate and regular rhythm.     Heart sounds: Normal heart sounds.  Pulmonary:     Effort: Pulmonary effort is normal. No respiratory distress.     Breath sounds: No wheezing, rhonchi or rales.     Comments: Tender left anterolateral ribs below bra. No rash noted. Chest:     Chest wall: Tenderness present.  Musculoskeletal:  Normal range of motion.  Skin:    Findings: No lesion or rash.  Neurological:     Mental Status: She is alert and oriented to person, place, and time.  Psychiatric:        Speech: Speech normal.        Behavior: Behavior normal.        Thought Content: Thought content normal.       Assessment & Plan    1. Rib pain on left side Onset over the holiday after carrying a 20 lb grandchild frequently. Pain is sharp with deep breath or cough. A tight wide bra helps relief discomfort. Apply moist heat and may use OTC NSAID prn. Suspected intercostal muscle strain/costochondritis. No sign of shingles. Recheck prn.     Vernie Murders, PA  [facility]

## 2018-07-11 ENCOUNTER — Ambulatory Visit: Payer: Self-pay | Admitting: Family Medicine

## 2018-07-16 ENCOUNTER — Other Ambulatory Visit: Payer: Self-pay | Admitting: Surgery

## 2018-07-16 ENCOUNTER — Encounter: Payer: Self-pay | Admitting: Surgery

## 2018-07-16 ENCOUNTER — Other Ambulatory Visit: Payer: Self-pay

## 2018-07-16 ENCOUNTER — Ambulatory Visit (INDEPENDENT_AMBULATORY_CARE_PROVIDER_SITE_OTHER): Payer: BLUE CROSS/BLUE SHIELD | Admitting: Surgery

## 2018-07-16 VITALS — BP 152/72 | HR 62 | Temp 97.6°F | Resp 16 | Ht 64.0 in | Wt 200.0 lb

## 2018-07-16 DIAGNOSIS — N6310 Unspecified lump in the right breast, unspecified quadrant: Secondary | ICD-10-CM

## 2018-07-16 NOTE — Patient Instructions (Signed)
We have spoken today about removing a lump in your breast. This will be done on 07/22/2018 by Dr. Olean Ree at [facility].  You will most likely be able to leave the hospital several hours after your surgery. Rarely, a patient needs to stay over night but this is a possibility.  Plan to tenatively be off work for 1-2 weeks following the surgery and may return with approximately 4 more weeks of a lifting restriction, no greater than 15 lbs.    Lumpectomy A lumpectomy is a form of "breast conserving" or "breast preservation" surgery. It may also be referred to as a partial mastectomy. During a lumpectomy, the portion of the breast that contains the cancerous tumor or breast mass (the lump) is removed. Some normal tissue around the lump may also be removed to make sure all of the tumor has been removed.  LET [facility] CARE PROVIDER KNOW ABOUT:  Any allergies you have.  All medicines you are taking, including vitamins, herbs, eye drops, creams, and over-the-counter medicines.  Previous problems you or members of your family have had with the use of anesthetics.  Any blood disorders you have.  Previous surgeries you have had.  Medical conditions you have. RISKS AND COMPLICATIONS Generally, this is a safe procedure. However, problems can occur and include:  Bleeding.  Infection.  Pain.  Temporary swelling.  Change in the shape of the breast, particularly if a large portion is removed. BEFORE THE PROCEDURE  Ask your health care provider about changing or stopping your regular medicines. This is especially important if you are taking diabetes medicines or blood thinners.  Do not eat or drink anything after midnight on the night before the procedure or as directed by your health care provider. Ask your health care provider if you can take a sip of water with any approved medicines.  On the day of surgery, your health care provider will use a mammogram or ultrasound to locate and mark  the tumor in your breast. These markings on your breast will show where the cut (incision) will be made. PROCEDURE   An IV tube will be put into one of your veins.  You may be given medicine to help you relax before the surgery (sedative). You will be given one of the following:  A medicine that numbs the area (local anesthetic).  A medicine that makes you fall asleep (general anesthetic).  Your health care provider will use a kind of electric scalpel that uses heat to minimize bleeding (electrocautery knife).  A curved incision (like a smile or frown) that follows the natural curve of your breast is made, to allow for minimal scarring and better healing.  The tumor will be removed with some of the surrounding tissue. This will be sent to the lab for analysis. Your health care provider may also remove your lymph nodes at this time if needed.  Sometimes, but not always, a rubber tube called a drain will be surgically inserted into your breast area or armpit to collect excess fluid that may accumulate in the space where the tumor was. This drain is connected to a plastic bulb on the outside of your body. This drain creates suction to help remove the fluid.  The incisions will be closed with stitches (sutures).  A bandage may be placed over the incisions. AFTER THE PROCEDURE  You will be taken to the recovery area.  You will be given medicine for pain.  A small rubber drain may be placed in  the breast for 2-3 days to prevent a collection of blood (hematoma) from developing in the breast. You will be given instructions on caring for the drain before you go home.  A pressure bandage (dressing) will be applied for 1-2 days to prevent bleeding. Ask your health care provider how to care for your bandage at home.   This information is not intended to replace advice given to you by your health care provider. Make sure you discuss any questions you have with your health care provider.     Document Released: 08/07/2006 Document Revised: 07/17/2014 Document Reviewed: 11/29/2012 Elsevier Interactive Patient Education [employer].

## 2018-07-16 NOTE — H&P (View-Only) (Signed)
07/16/2018  Reason for Visit:  Right breast mass  Referring Provider:  Vernie Murders, PA  History of Present Illness: Bridget Green is a 63 y.o. female presenting for evaluation of right breast mass.  Patient underwent her regular screening mammogram on 05/30/2018 which found an area of abnormality in the right breast.  She had a diagnostic right mammogram on 12/5 as well as ultrasound.  Mammogram showed a persistent right breast distortion within the upper outer quadrant and ultrasound did not show any definite sonographic correlate.  She underwent a biopsy on 12/12 which showed predominantly mature adipose tissue with small interspersed areas of benign mammary tissue with early cyst formation, apocrine metaplasia, duct ectasia, pseudo-angiomatous stromal hyperplasia, and focal changes suggestive of early/small complex sclerosing lesion.  It was negative for atypia or malignancy.  Given these findings, surgical referral was recommended for excision.  Patient denies any palpable masses or any right breast pain.  Denies any nipple discharge or skin changes.  She does not do self breast exams.  Of note, patient had her first menses at age 23, has had 2 pregnancies, no breast-feeding, birth control for 16 years, reached menopause at age 1, and there is a history of breast cancer in her paternal first cousin.  Past Medical History: Past Medical History:  Diagnosis Date  . Hyperlipidemia   . Hypertension      Past Surgical History: Past Surgical History:  Procedure Laterality Date  . ADENOIDECTOMY  1960  . APPENDECTOMY  1983  . BREAST BIOPSY Right 06/20/2018   stereo for distortion path pending  . CESAREAN SECTION  [id number]  . CHOLECYSTECTOMY  12/2011  . LASIK Bilateral 2008  . TONSILLECTOMY  1960    Home Medications: Prior to Admission medications   Medication Sig Start Date End Date Taking? Authorizing Provider  ezetimibe (ZETIA) 10 MG tablet TAKE 1 TABLET BY MOUTH DAILY 02/08/18   Yes Fenton Malling M, PA-C  fluticasone [facility]) 50 MCG/ACT nasal spray PLACE 2 SPRAYS INTO BOTH NOSTRILS AT BEDTIME 07/01/18  Yes Chrismon, Vickki Muff, PA  lisinopril-hydrochlorothiazide (PRINZIDE,ZESTORETIC) 20-12.5 MG tablet TAKE ONE TABLET BY MOUTH EVERY DAY 11/12/17  Yes Chrismon, Vickki Muff, PA  omeprazole (PRILOSEC) 20 MG capsule TAKE 1 CAPSULE BY MOUTH ONCE DAILY 05/06/18  Yes Chrismon, Vickki Muff, PA    Allergies: Allergies  Allergen Reactions  . Penicillins Itching  . Sulfa Antibiotics Itching    Social History:  reports that she has never smoked. She has never used smokeless tobacco. She reports current alcohol use. She reports that she does not use drugs.   Family History: Family History  Problem Relation Age of Onset  . Colon cancer Father   . Arthritis Sister   . Hypertension Sister   . Heart attack Maternal Grandfather   . Heart attack Paternal Grandmother   . Thyroid cancer Paternal Grandmother   . Aneurysm Paternal Grandfather   . Arthritis Mother   . Hypertension Mother   . Hyperlipidemia Mother   . Breast cancer Cousin     Review of Systems: Review of Systems  Constitutional: Negative for chills and fever.  HENT: Negative for hearing loss.   Eyes: Negative for blurred vision.  Respiratory: Negative for shortness of breath.   Cardiovascular: Negative for chest pain.  Gastrointestinal: Negative for abdominal pain, nausea and vomiting.  Genitourinary: Negative for dysuria.  Musculoskeletal: Negative for myalgias.  Skin: Negative for rash.  Neurological: Negative for dizziness.  Psychiatric/Behavioral: Negative for depression.    Physical Exam  BP (!) 152/72   Pulse 62   Temp 97.6 F (36.4 C) (Oral)   Resp 16   Ht 5\' 4"  (1.626 m)   Wt 200 lb (90.7 kg)   SpO2 100%   BMI 34.33 kg/m  CONSTITUTIONAL: No acute distress. HEENT:  Normocephalic, atraumatic, extraocular motion intact. NECK: Trachea is midline, and there is no jugular venous distension.   RESPIRATORY:  Lungs are clear, and breath sounds are equal bilaterally. Normal respiratory effort without pathologic use of accessory muscles. CARDIOVASCULAR: Heart is regular without murmurs, gallops, or rubs. BREAST:  Right breast without any palpable masses or lumps.  There is a well healing right lateral breast biopsy site without any ecchymosis or infection.  There is no nipple changes or drainage.  There is no palpable right axillary lymphadenopathy.  Exam on left side is negative. GI: The abdomen is soft, nontender, nondistended.  MUSCULOSKELETAL:  Normal muscle strength and tone in all four extremities.  No peripheral edema or cyanosis. SKIN: Skin turgor is normal. There are no pathologic skin lesions.  NEUROLOGIC:  Motor and sensation is grossly normal.  Cranial nerves are grossly intact. PSYCH:  Alert and oriented to person, place and time. Affect is normal.  Laboratory Analysis: Pathology 06/20/18: DIAGNOSIS:  A. BREAST, RIGHT UPPER OUTER QUADRANT; STEREOTACTIC BIOPSY:  - PREDOMINANTLY MATURE ADIPOSE TISSUE WITH SMALL INTERSPERSED AREAS OF BENIGN MAMMARY TISSUE WITH EARLY CYST FORMATION, APOCRINE METAPLASIA, DUCT ECTASIA, PSEUDO-ANGIOMATOUS STROMAL HYPERPLASIA, AND FOCAL CHANGES  SUGGESTIVE OF EARLY / SMALL COMPLEX SCLEROSING LESION.  - NEGATIVE FOR ATYPIA AND MALIGNANCY.  - DEEPER SECTIONS EXAMINED.   Imaging: Mammogram and Ultrasound 06/13/18: FINDINGS: 2D/3D full field and spot compression views of the RIGHT breast demonstrate apparent persistent distortion within the UPPER-OUTER RIGHT breast.  Mammographic images were processed with CAD.  Targeted ultrasound is performed, showing no definite sonographic correlate to the probable distortion identified mammographically.  No abnormal RIGHT axillary lymph nodes are identified.  IMPRESSION: Apparent persistent distortion within the UPPER OUTER RIGHT breast, tissue sampling is recommended. If distortion is not identified  at time of biopsy, six-month follow-up would be warranted.  No abnormal appearing RIGHT axillary lymph nodes.  Assessment and Plan: This is a 63 y.o. female with new right breast mass.    Discussed with patient that the biopsy did not reveal any atypical cells or malignancy, but that given the findings, it is recommended that she undergo excision.  She does understand this and she wants to proceed with excision just in case.  Discussed with her that given that the area is not palpable, we would require wire-localization in order to excise the mass.  As there is currently no suspicion for malignancy, no lymph node sampling is required.  Discussed with her the risks of bleeding, infection, and injury to surrounding structures and she's willing to proceed.  She'll be scheduled for 07/22/18.  Face-to-face time spent with the patient and care providers was 60 minutes, with more than 50% of the time spent counseling, educating, and coordinating care of the patient.     Melvyn Neth, [facility]

## 2018-07-16 NOTE — Progress Notes (Signed)
07/16/2018  Reason for Visit:  Right breast mass  Referring Provider:  Vernie Murders, PA  History of Present Illness: Bridget Green is a 63 y.o. female presenting for evaluation of right breast mass.  Patient underwent her regular screening mammogram on 05/30/2018 which found an area of abnormality in the right breast.  She had a diagnostic right mammogram on 12/5 as well as ultrasound.  Mammogram showed a persistent right breast distortion within the upper outer quadrant and ultrasound did not show any definite sonographic correlate.  She underwent a biopsy on 12/12 which showed predominantly mature adipose tissue with small interspersed areas of benign mammary tissue with early cyst formation, apocrine metaplasia, duct ectasia, pseudo-angiomatous stromal hyperplasia, and focal changes suggestive of early/small complex sclerosing lesion.  It was negative for atypia or malignancy.  Given these findings, surgical referral was recommended for excision.  Patient denies any palpable masses or any right breast pain.  Denies any nipple discharge or skin changes.  She does not do self breast exams.  Of note, patient had her first menses at age 63, has had 2 pregnancies, no breast-feeding, birth control for 16 years, reached menopause at age 30, and there is a history of breast cancer in her paternal first cousin.  Past Medical History: Past Medical History:  Diagnosis Date  . Hyperlipidemia   . Hypertension      Past Surgical History: Past Surgical History:  Procedure Laterality Date  . ADENOIDECTOMY  1960  . APPENDECTOMY  1983  . BREAST BIOPSY Right 06/20/2018   stereo for distortion path pending  . CESAREAN SECTION  [id number]  . CHOLECYSTECTOMY  12/2011  . LASIK Bilateral 2008  . TONSILLECTOMY  1960    Home Medications: Prior to Admission medications   Medication Sig Start Date End Date Taking? Authorizing Provider  ezetimibe (ZETIA) 10 MG tablet TAKE 1 TABLET BY MOUTH DAILY 02/08/18   Yes Fenton Malling M, PA-C  fluticasone [facility]) 50 MCG/ACT nasal spray PLACE 2 SPRAYS INTO BOTH NOSTRILS AT BEDTIME 07/01/18  Yes Chrismon, Vickki Muff, PA  lisinopril-hydrochlorothiazide (PRINZIDE,ZESTORETIC) 20-12.5 MG tablet TAKE ONE TABLET BY MOUTH EVERY DAY 11/12/17  Yes Chrismon, Vickki Muff, PA  omeprazole (PRILOSEC) 20 MG capsule TAKE 1 CAPSULE BY MOUTH ONCE DAILY 05/06/18  Yes Chrismon, Vickki Muff, PA    Allergies: Allergies  Allergen Reactions  . Penicillins Itching  . Sulfa Antibiotics Itching    Social History:  reports that she has never smoked. She has never used smokeless tobacco. She reports current alcohol use. She reports that she does not use drugs.   Family History: Family History  Problem Relation Age of Onset  . Colon cancer Father   . Arthritis Sister   . Hypertension Sister   . Heart attack Maternal Grandfather   . Heart attack Paternal Grandmother   . Thyroid cancer Paternal Grandmother   . Aneurysm Paternal Grandfather   . Arthritis Mother   . Hypertension Mother   . Hyperlipidemia Mother   . Breast cancer Cousin     Review of Systems: Review of Systems  Constitutional: Negative for chills and fever.  HENT: Negative for hearing loss.   Eyes: Negative for blurred vision.  Respiratory: Negative for shortness of breath.   Cardiovascular: Negative for chest pain.  Gastrointestinal: Negative for abdominal pain, nausea and vomiting.  Genitourinary: Negative for dysuria.  Musculoskeletal: Negative for myalgias.  Skin: Negative for rash.  Neurological: Negative for dizziness.  Psychiatric/Behavioral: Negative for depression.    Physical Exam  BP (!) 152/72   Pulse 62   Temp 97.6 F (36.4 C) (Oral)   Resp 16   Ht 5\' 4"  (1.626 m)   Wt 200 lb (90.7 kg)   SpO2 100%   BMI 34.33 kg/m  CONSTITUTIONAL: No acute distress. HEENT:  Normocephalic, atraumatic, extraocular motion intact. NECK: Trachea is midline, and there is no jugular venous distension.   RESPIRATORY:  Lungs are clear, and breath sounds are equal bilaterally. Normal respiratory effort without pathologic use of accessory muscles. CARDIOVASCULAR: Heart is regular without murmurs, gallops, or rubs. BREAST:  Right breast without any palpable masses or lumps.  There is a well healing right lateral breast biopsy site without any ecchymosis or infection.  There is no nipple changes or drainage.  There is no palpable right axillary lymphadenopathy.  Exam on left side is negative. GI: The abdomen is soft, nontender, nondistended.  MUSCULOSKELETAL:  Normal muscle strength and tone in all four extremities.  No peripheral edema or cyanosis. SKIN: Skin turgor is normal. There are no pathologic skin lesions.  NEUROLOGIC:  Motor and sensation is grossly normal.  Cranial nerves are grossly intact. PSYCH:  Alert and oriented to person, place and time. Affect is normal.  Laboratory Analysis: Pathology 06/20/18: DIAGNOSIS:  A. BREAST, RIGHT UPPER OUTER QUADRANT; STEREOTACTIC BIOPSY:  - PREDOMINANTLY MATURE ADIPOSE TISSUE WITH SMALL INTERSPERSED AREAS OF BENIGN MAMMARY TISSUE WITH EARLY CYST FORMATION, APOCRINE METAPLASIA, DUCT ECTASIA, PSEUDO-ANGIOMATOUS STROMAL HYPERPLASIA, AND FOCAL CHANGES  SUGGESTIVE OF EARLY / SMALL COMPLEX SCLEROSING LESION.  - NEGATIVE FOR ATYPIA AND MALIGNANCY.  - DEEPER SECTIONS EXAMINED.   Imaging: Mammogram and Ultrasound 06/13/18: FINDINGS: 2D/3D full field and spot compression views of the RIGHT breast demonstrate apparent persistent distortion within the UPPER-OUTER RIGHT breast.  Mammographic images were processed with CAD.  Targeted ultrasound is performed, showing no definite sonographic correlate to the probable distortion identified mammographically.  No abnormal RIGHT axillary lymph nodes are identified.  IMPRESSION: Apparent persistent distortion within the UPPER OUTER RIGHT breast, tissue sampling is recommended. If distortion is not identified  at time of biopsy, six-month follow-up would be warranted.  No abnormal appearing RIGHT axillary lymph nodes.  Assessment and Plan: This is a 63 y.o. female with new right breast mass.    Discussed with patient that the biopsy did not reveal any atypical cells or malignancy, but that given the findings, it is recommended that she undergo excision.  She does understand this and she wants to proceed with excision just in case.  Discussed with her that given that the area is not palpable, we would require wire-localization in order to excise the mass.  As there is currently no suspicion for malignancy, no lymph node sampling is required.  Discussed with her the risks of bleeding, infection, and injury to surrounding structures and she's willing to proceed.  She'll be scheduled for 07/22/18.  Face-to-face time spent with the patient and care providers was 60 minutes, with more than 50% of the time spent counseling, educating, and coordinating care of the patient.     Melvyn Neth, [facility]

## 2018-07-17 ENCOUNTER — Telehealth: Payer: Self-pay

## 2018-07-17 NOTE — Telephone Encounter (Signed)
Spoke with patient about her surgery. The patient is scheduled for surgery at [facility] on 07/22/18 with Dr Hampton Abbot. She will arrive that day at the [facility] at 8:30 am. She is aware of date, time, and instructions.

## 2018-07-19 ENCOUNTER — Encounter
Admission: RE | Admit: 2018-07-19 | Discharge: 2018-07-19 | Disposition: A | Discharge status: Home / Self Care | Payer: BLUE CROSS/BLUE SHIELD | Source: Ambulatory Visit | Attending: Surgery | Admitting: Surgery

## 2018-07-19 ENCOUNTER — Other Ambulatory Visit: Payer: Self-pay

## 2018-07-19 HISTORY — DX: Family history of other specified conditions: Z84.89

## 2018-07-19 HISTORY — DX: Personal history of other diseases of the digestive system: Z87.19

## 2018-07-19 HISTORY — DX: Gastro-esophageal reflux disease without esophagitis: K21.9

## 2018-07-19 HISTORY — DX: Unspecified osteoarthritis, unspecified site: M19.90

## 2018-07-19 NOTE — Patient Instructions (Signed)
Your procedure is scheduled on: 07-22-18 Report to [facility] @ 8:30 AM  Remember: Instructions that are not followed completely may result in serious medical risk, up to and including death, or upon the discretion of your surgeon and anesthesiologist your surgery may need to be rescheduled.    _x___ 1. Do not eat food after midnight the night before your procedure. You may drink clear liquids up to 2 hours before you are scheduled to arrive at the hospital for your procedure.  Do not drink clear liquids within 2 hours of your scheduled arrival to the hospital.  Clear liquids include  --Water or Apple juice without pulp  --Clear carbohydrate beverage such as ClearFast or Gatorade  --Black Coffee or Clear Tea (No milk, no creamers, do not add anything to the coffee or Tea   ____Ensure clear carbohydrate drink on the way to the hospital for bariatric patients  ____Ensure clear carbohydrate drink 3 hours before surgery for Dr Dwyane Luo patients if physician instructed.   No gum chewing or hard candies.     __x__ 2. No Alcohol for 24 hours before or after surgery.   __x__3. No Smoking or e-cigarettes for 24 prior to surgery.  Do not use any chewable tobacco products for at least 6 hour prior to surgery   ____  4. Bring all medications with you on the day of surgery if instructed.    __x__ 5. Notify your doctor if there is any change in your medical condition     (cold, fever, infections).    x___6. On the morning of surgery brush your teeth with toothpaste and water.  You may rinse your mouth with mouth wash if you wish.  Do not swallow any toothpaste or mouthwash.   Do not wear jewelry, make-up, hairpins, clips or nail polish.  Do not wear lotions, powders, or perfumes. You may wear deodorant.  Do not shave 48 hours prior to surgery. Men may shave face and neck.  Do not bring valuables to the hospital.    [facility] is not responsible for any belongings or valuables.    Contacts, dentures or bridgework may not be worn into surgery.  Leave your suitcase in the car. After surgery it may be brought to your room.  For patients admitted to the hospital, discharge time is determined by your treatment team.  _  Patients discharged the day of surgery will not be allowed to drive home.  You will need someone to drive you home and stay with you the night of your procedure.    Please read over the following fact sheets that you were given:   [facility] Preparing for Surgery   _x___ TAKE THE FOLLOWING MEDICATION THE MORNING OF SURGERY WITH A SMALL SIP OF WATER. These include:  1. ZETIA  2. PRILOSEC  3. TAKE AN EXTRA PRILOSEC THE NIGHT BEFORE SURGERY  4.  5.  6.  ____Fleets enema or Magnesium Citrate as directed.   ____ Use CHG Soap or sage wipes as directed on instruction sheet   ____ Use inhalers on the day of surgery and bring to hospital day of surgery  ____ Stop Metformin and Janumet 2 days prior to surgery.    ____ Take 1/2 of usual insulin dose the night before surgery and none on the morning  surgery.   ____ Follow recommendations from Cardiologist, Pulmonologist or PCP regarding stopping Aspirin, Coumadin, Plavix ,Eliquis, Effient, or Pradaxa, and Pletal.  X____Stop Anti-inflammatories such as Advil, Aleve, Ibuprofen, Motrin, Naproxen,  Naprosyn, Goodies powders or aspirin products NOW-OK to take Tylenol    ____ Stop supplements until after surgery.   ____ Bring C-Pap to the hospital.

## 2018-07-22 ENCOUNTER — Ambulatory Visit
Admission: RE | Admit: 2018-07-22 | Discharge: 2018-07-22 | Disposition: A | Discharge status: Home / Self Care | Payer: BLUE CROSS/BLUE SHIELD | Source: Ambulatory Visit | Attending: Surgery | Admitting: Surgery

## 2018-07-22 ENCOUNTER — Ambulatory Visit: Payer: BLUE CROSS/BLUE SHIELD | Admitting: Certified Registered"

## 2018-07-22 ENCOUNTER — Encounter
Admission: RE | Disposition: A | Discharge status: Home / Self Care | Payer: Self-pay | Source: Ambulatory Visit | Attending: Surgery

## 2018-07-22 ENCOUNTER — Encounter: Payer: Self-pay | Admitting: *Deleted

## 2018-07-22 DIAGNOSIS — R928 Other abnormal and inconclusive findings on diagnostic imaging of breast: Secondary | ICD-10-CM | POA: Diagnosis not present

## 2018-07-22 DIAGNOSIS — Z882 Allergy status to sulfonamides status: Secondary | ICD-10-CM | POA: Diagnosis not present

## 2018-07-22 DIAGNOSIS — E785 Hyperlipidemia, unspecified: Secondary | ICD-10-CM | POA: Diagnosis not present

## 2018-07-22 DIAGNOSIS — N6031 Fibrosclerosis of right breast: Secondary | ICD-10-CM | POA: Insufficient documentation

## 2018-07-22 DIAGNOSIS — N6310 Unspecified lump in the right breast, unspecified quadrant: Secondary | ICD-10-CM | POA: Diagnosis not present

## 2018-07-22 DIAGNOSIS — I10 Essential (primary) hypertension: Secondary | ICD-10-CM | POA: Insufficient documentation

## 2018-07-22 DIAGNOSIS — N6081 Other benign mammary dysplasias of right breast: Secondary | ICD-10-CM | POA: Diagnosis not present

## 2018-07-22 DIAGNOSIS — Z79899 Other long term (current) drug therapy: Secondary | ICD-10-CM | POA: Diagnosis not present

## 2018-07-22 HISTORY — PX: BREAST LUMPECTOMY WITH NEEDLE LOCALIZATION: SHX5759

## 2018-07-22 HISTORY — PX: BREAST EXCISIONAL BIOPSY: SUR124

## 2018-07-22 LAB — POCT I-STAT 4, (NA,K, GLUC, HGB,HCT)
Glucose, Bld: 102 mg/dL — ABNORMAL HIGH (ref 70–99)
HCT: 42 % (ref 36.0–46.0)
Hemoglobin: 14.3 g/dL (ref 12.0–15.0)
Potassium: 3.9 mmol/L (ref 3.5–5.1)
Sodium: 141 mmol/L (ref 135–145)

## 2018-07-22 SURGERY — BREAST LUMPECTOMY WITH NEEDLE LOCALIZATION
Anesthesia: General | Laterality: Right

## 2018-07-22 MED ORDER — GABAPENTIN 300 MG PO CAPS
ORAL_CAPSULE | ORAL | Status: AC
  Administered 2018-07-22: 300 mg via ORAL
  Filled 2018-07-22: qty 1

## 2018-07-22 MED ORDER — CHLORHEXIDINE GLUCONATE CLOTH 2 % EX PADS: 6.0000 | MEDICATED_PAD | Freq: Once | CUTANEOUS | Status: DC

## 2018-07-22 MED ORDER — OXYCODONE HCL 5 MG PO TABS: 5.0000 mg | ORAL_TABLET | ORAL | 0 refills | Status: DC | PRN

## 2018-07-22 MED ORDER — LACTATED RINGERS IV SOLN
INTRAVENOUS | Status: DC
  Administered 2018-07-22: 09:00:00 via INTRAVENOUS

## 2018-07-22 MED ORDER — MIDAZOLAM HCL 2 MG/2ML IJ SOLN
INTRAMUSCULAR | Status: DC | PRN
  Administered 2018-07-22: 2 mg via INTRAVENOUS

## 2018-07-22 MED ORDER — ONDANSETRON HCL 4 MG/2ML IJ SOLN
INTRAMUSCULAR | Status: DC | PRN
  Administered 2018-07-22: 4 mg via INTRAVENOUS

## 2018-07-22 MED ORDER — PROPOFOL 10 MG/ML IV BOLUS
INTRAVENOUS | Status: AC
  Filled 2018-07-22: qty 20

## 2018-07-22 MED ORDER — FENTANYL CITRATE (PF) 100 MCG/2ML IJ SOLN
INTRAMUSCULAR | Status: DC | PRN
  Administered 2018-07-22 (×2): 25 ug via INTRAVENOUS

## 2018-07-22 MED ORDER — ROCURONIUM BROMIDE 50 MG/5ML IV SOLN
INTRAVENOUS | Status: AC
  Filled 2018-07-22: qty 1

## 2018-07-22 MED ORDER — FAMOTIDINE 20 MG PO TABS
ORAL_TABLET | ORAL | Status: AC
  Filled 2018-07-22: qty 1

## 2018-07-22 MED ORDER — LACTATED RINGERS IV SOLN
INTRAVENOUS | Status: DC | PRN
  Administered 2018-07-22 (×2): via INTRAVENOUS

## 2018-07-22 MED ORDER — ACETAMINOPHEN 500 MG PO TABS
1000.0000 mg | ORAL_TABLET | ORAL | Status: AC
  Administered 2018-07-22: 1000 mg via ORAL

## 2018-07-22 MED ORDER — OXYCODONE HCL 5 MG/5ML PO SOLN: 5.0000 mg | Freq: Once | ORAL | Status: AC | PRN

## 2018-07-22 MED ORDER — EPHEDRINE SULFATE 50 MG/ML IJ SOLN
INTRAMUSCULAR | Status: DC | PRN
  Administered 2018-07-22: 5 mg via INTRAVENOUS

## 2018-07-22 MED ORDER — BUPIVACAINE-EPINEPHRINE 0.5% -1:200000 IJ SOLN
INTRAMUSCULAR | Status: DC | PRN
  Administered 2018-07-22: 30 mL

## 2018-07-22 MED ORDER — CLINDAMYCIN PHOSPHATE 900 MG/50ML IV SOLN
INTRAVENOUS | Status: AC
  Filled 2018-07-22: qty 50

## 2018-07-22 MED ORDER — MEPERIDINE HCL 50 MG/ML IJ SOLN: 6.2500 mg | INTRAMUSCULAR | Status: DC | PRN

## 2018-07-22 MED ORDER — CLINDAMYCIN PHOSPHATE 900 MG/50ML IV SOLN
900.0000 mg | INTRAVENOUS | Status: AC
  Administered 2018-07-22: 900 mg via INTRAVENOUS

## 2018-07-22 MED ORDER — OXYCODONE HCL 5 MG PO TABS
ORAL_TABLET | ORAL | Status: AC
  Filled 2018-07-22: qty 1

## 2018-07-22 MED ORDER — ACETAMINOPHEN 500 MG PO TABS
ORAL_TABLET | ORAL | Status: AC
  Administered 2018-07-22: 1000 mg via ORAL
  Filled 2018-07-22: qty 2

## 2018-07-22 MED ORDER — ONDANSETRON HCL 4 MG/2ML IJ SOLN
INTRAMUSCULAR | Status: AC
  Filled 2018-07-22: qty 2

## 2018-07-22 MED ORDER — DEXAMETHASONE SODIUM PHOSPHATE 10 MG/ML IJ SOLN
INTRAMUSCULAR | Status: DC | PRN
  Administered 2018-07-22: 10 mg via INTRAVENOUS

## 2018-07-22 MED ORDER — PROMETHAZINE HCL 25 MG/ML IJ SOLN: 6.2500 mg | INTRAMUSCULAR | Status: DC | PRN

## 2018-07-22 MED ORDER — PHENYLEPHRINE HCL 10 MG/ML IJ SOLN
INTRAMUSCULAR | Status: DC | PRN
  Administered 2018-07-22: 100 ug via INTRAVENOUS
  Administered 2018-07-22: 50 ug via INTRAVENOUS
  Administered 2018-07-22: 100 ug via INTRAVENOUS

## 2018-07-22 MED ORDER — DEXAMETHASONE SODIUM PHOSPHATE 10 MG/ML IJ SOLN
INTRAMUSCULAR | Status: AC
  Filled 2018-07-22: qty 1

## 2018-07-22 MED ORDER — LIDOCAINE HCL (PF) 2 % IJ SOLN
INTRAMUSCULAR | Status: AC
  Filled 2018-07-22: qty 10

## 2018-07-22 MED ORDER — OXYCODONE HCL 5 MG PO TABS
5.0000 mg | ORAL_TABLET | Freq: Once | ORAL | Status: AC | PRN
  Administered 2018-07-22: 5 mg via ORAL

## 2018-07-22 MED ORDER — PROPOFOL 10 MG/ML IV BOLUS
INTRAVENOUS | Status: DC | PRN
  Administered 2018-07-22: 150 mg via INTRAVENOUS

## 2018-07-22 MED ORDER — GABAPENTIN 300 MG PO CAPS
300.0000 mg | ORAL_CAPSULE | ORAL | Status: AC
  Administered 2018-07-22: 300 mg via ORAL

## 2018-07-22 MED ORDER — FENTANYL CITRATE (PF) 250 MCG/5ML IJ SOLN
INTRAMUSCULAR | Status: AC
  Filled 2018-07-22: qty 5

## 2018-07-22 MED ORDER — MIDAZOLAM HCL 2 MG/2ML IJ SOLN
INTRAMUSCULAR | Status: AC
  Filled 2018-07-22: qty 2

## 2018-07-22 MED ORDER — LIDOCAINE HCL (CARDIAC) PF 100 MG/5ML IV SOSY
PREFILLED_SYRINGE | INTRAVENOUS | Status: DC | PRN
  Administered 2018-07-22: 100 mg via INTRAVENOUS

## 2018-07-22 MED ORDER — GLYCOPYRROLATE 0.2 MG/ML IJ SOLN
INTRAMUSCULAR | Status: DC | PRN
  Administered 2018-07-22: 0.2 mg via INTRAVENOUS

## 2018-07-22 MED ORDER — FENTANYL CITRATE (PF) 100 MCG/2ML IJ SOLN: 25.0000 ug | INTRAMUSCULAR | Status: DC | PRN

## 2018-07-22 MED ORDER — IBUPROFEN 600 MG PO TABS: 600.0000 mg | ORAL_TABLET | Freq: Three times a day (TID) | ORAL | 0 refills | Status: DC | PRN

## 2018-07-22 SURGICAL SUPPLY — 47 items
APPLIER CLIP 9.375 SM OPEN (CLIP)
BINDER BREAST LRG (GAUZE/BANDAGES/DRESSINGS) IMPLANT
BINDER BREAST MEDIUM (GAUZE/BANDAGES/DRESSINGS) IMPLANT
BLADE PHOTON ILLUMINATED (MISCELLANEOUS) ×2 IMPLANT
BLADE SURG 15 STRL LF DISP TIS (BLADE) ×2 IMPLANT
BLADE SURG 15 STRL SS (BLADE) ×2
CANISTER SUCT 1200ML W/VALVE (MISCELLANEOUS) ×2 IMPLANT
CHLORAPREP W/TINT 26ML (MISCELLANEOUS) ×2 IMPLANT
CLIP APPLIE 9.375 SM OPEN (CLIP) IMPLANT
CNTNR SPEC 2.5X3XGRAD LEK (MISCELLANEOUS) ×1
CONT SPEC 4OZ STER OR WHT (MISCELLANEOUS) ×1
CONTAINER SPEC 2.5X3XGRAD LEK (MISCELLANEOUS) ×1 IMPLANT
COVER PROBE FLX POLY STRL (MISCELLANEOUS) ×2 IMPLANT
COVER WAND RF STERILE (DRAPES) IMPLANT
DERMABOND ADVANCED (GAUZE/BANDAGES/DRESSINGS) ×1
DERMABOND ADVANCED .7 DNX12 (GAUZE/BANDAGES/DRESSINGS) ×1 IMPLANT
DEVICE DUBIN SPECIMEN MAMMOGRA (MISCELLANEOUS) ×2 IMPLANT
DRAPE LAPAROTOMY 100X77 ABD (DRAPES) ×2 IMPLANT
DRSG GAUZE FLUFF 36X18 (GAUZE/BANDAGES/DRESSINGS) ×2 IMPLANT
ELECT CAUTERY BLADE 6.4 (BLADE) ×2 IMPLANT
ELECT REM PT RETURN 9FT ADLT (ELECTROSURGICAL) ×2
ELECTRODE REM PT RTRN 9FT ADLT (ELECTROSURGICAL) ×1 IMPLANT
GLOVE SURG SYN 7.0 (GLOVE) ×2 IMPLANT
GLOVE SURG SYN 7.5  E (GLOVE) ×1
GLOVE SURG SYN 7.5 E (GLOVE) ×1 IMPLANT
GOWN STRL REUS W/ TWL LRG LVL3 (GOWN DISPOSABLE) ×2 IMPLANT
GOWN STRL REUS W/TWL LRG LVL3 (GOWN DISPOSABLE) ×2
KIT TURNOVER KIT A (KITS) ×2 IMPLANT
LABEL OR SOLS (LABEL) ×2 IMPLANT
NEEDLE HYPO 22GX1.5 SAFETY (NEEDLE) ×2 IMPLANT
NEEDLE HYPO 25X1 1.5 SAFETY (NEEDLE) ×2 IMPLANT
PACK BASIN MINOR ARMC (MISCELLANEOUS) ×2 IMPLANT
RETRACTOR RING XSMALL (MISCELLANEOUS) ×1 IMPLANT
RTRCTR WOUND ALEXIS 13CM XS SH (MISCELLANEOUS) ×2
SLEVE PROBE SENORX GAMMA FIND (MISCELLANEOUS) IMPLANT
SUT ETHILON 3-0 FS-10 30 BLK (SUTURE) ×2
SUT MNCRL 4-0 (SUTURE) ×1
SUT MNCRL 4-0 27XMFL (SUTURE) ×1
SUT SILK 3 0 SH 30 (SUTURE) ×2 IMPLANT
SUT VIC AB 3-0 SH 27 (SUTURE) ×1
SUT VIC AB 3-0 SH 27X BRD (SUTURE) ×1 IMPLANT
SUTURE EHLN 3-0 FS-10 30 BLK (SUTURE) ×1 IMPLANT
SUTURE MNCRL 4-0 27XMF (SUTURE) ×1 IMPLANT
SYR 10ML LL (SYRINGE) ×2 IMPLANT
SYR BULB IRRIG 60ML STRL (SYRINGE) ×2 IMPLANT
TAPE TRANSPORE STRL 2 31045 (GAUZE/BANDAGES/DRESSINGS) IMPLANT
WATER STERILE IRR 1000ML POUR (IV SOLUTION) ×2 IMPLANT

## 2018-07-22 NOTE — Anesthesia Post-op Follow-up Note (Signed)
Anesthesia QCDR form completed.        

## 2018-07-22 NOTE — Anesthesia Preprocedure Evaluation (Signed)
Anesthesia Evaluation  Patient identified by MRN, date of birth, ID band Patient awake    Reviewed: Allergy & Precautions, NPO status , Patient's Chart, lab work & pertinent test results  History of Anesthesia Complications Negative for: history of anesthetic complications  Airway Mallampati: II  TM Distance: >3 FB Neck ROM: Full    Dental no notable dental hx.    Pulmonary asthma , neg sleep apnea, neg COPD,    breath sounds clear to auscultation- rhonchi (-) wheezing      Cardiovascular hypertension, Pt. on medications (-) CAD, (-) Past MI, (-) Cardiac Stents and (-) CABG  Rhythm:Regular Rate:Normal - Systolic murmurs and - Diastolic murmurs    Neuro/Psych neg Seizures negative neurological ROS  negative psych ROS   GI/Hepatic Neg liver ROS, hiatal hernia, GERD  ,  Endo/Other  negative endocrine ROSneg diabetes  Renal/GU negative Renal ROS     Musculoskeletal  (+) Arthritis ,   Abdominal (+) + obese,   Peds  Hematology negative hematology ROS (+)   Anesthesia Other Findings Past Medical History: No date: Arthritis No date: Family history of adverse reaction to anesthesia     Comment:  DAD-HARD TO WAKE UP No date: GERD (gastroesophageal reflux disease) No date: History of hiatal hernia No date: Hyperlipidemia No date: Hypertension   Reproductive/Obstetrics                             Anesthesia Physical Anesthesia Plan  ASA: II  Anesthesia Plan: General   Post-op Pain Management:    Induction: Intravenous  PONV Risk Score and Plan: 2 and Ondansetron, Dexamethasone and Midazolam  Airway Management Planned: LMA  Additional Equipment:   Intra-op Plan:   Post-operative Plan:   Informed Consent: I have reviewed the patients History and Physical, chart, labs and discussed the procedure including the risks, benefits and alternatives for the proposed anesthesia with the  patient or authorized representative who has indicated his/her understanding and acceptance.   Dental advisory given  Plan Discussed with: CRNA and Anesthesiologist  Anesthesia Plan Comments:         Anesthesia Quick Evaluation

## 2018-07-22 NOTE — Transfer of Care (Signed)
Immediate Anesthesia Transfer of Care Note  [name]  Procedure(s) Performed: BREAST LUMPECTOMY WITH NEEDLE LOCALIZATION (Right )  Patient Location: PACU  Anesthesia Type:General  Level of Consciousness: sedated  Airway & Oxygen Therapy: Patient Spontanous Breathing and Patient connected to face mask oxygen  Post-op Assessment: Report given to RN and Post -op Vital signs reviewed and stable  Post vital signs: Reviewed and stable  Last Vitals:  Vitals Value Taken Time  BP 156/67 07/22/2018  3:11 PM  Temp 36.6 C 07/22/2018  3:11 PM  Pulse 92 07/22/2018  3:12 PM  Resp 22 07/22/2018  3:12 PM  SpO2 99 % 07/22/2018  3:12 PM  Vitals shown include unvalidated device data.  Last Pain:  Vitals:   07/22/18 1511  TempSrc:   PainSc: Asleep         Complications: No apparent anesthesia complications

## 2018-07-22 NOTE — Anesthesia Procedure Notes (Signed)
Procedure Name: LMA Insertion Performed by: Fletcher-Harrison, Roizy Harold, CRNA Pre-anesthesia Checklist: Patient identified, Emergency Drugs available, Suction available and Patient being monitored Patient Re-evaluated:Patient Re-evaluated prior to induction Oxygen Delivery Method: Circle system utilized Preoxygenation: Pre-oxygenation with 100% oxygen Induction Type: IV induction Ventilation: Mask ventilation without difficulty LMA: LMA inserted LMA Size: 4.0 Number of attempts: 1 Placement Confirmation: positive ETCO2,  CO2 detector and breath sounds checked- equal and bilateral Tube secured with: Tape Dental Injury: Teeth and Oropharynx as per pre-operative assessment        

## 2018-07-22 NOTE — Anesthesia Postprocedure Evaluation (Signed)
Anesthesia Post Note  [name]  Procedure(s) Performed: BREAST LUMPECTOMY WITH NEEDLE LOCALIZATION (Right )  Patient location during evaluation: PACU Anesthesia Type: General Level of consciousness: awake and alert Pain management: pain level controlled Vital Signs Assessment: post-procedure vital signs reviewed and stable Respiratory status: spontaneous breathing, nonlabored ventilation, respiratory function stable and patient connected to nasal cannula oxygen Cardiovascular status: blood pressure returned to baseline and stable Postop Assessment: no apparent nausea or vomiting Anesthetic complications: no     Last Vitals:  Vitals:   07/22/18 1611 07/22/18 1626  BP: 139/68 (!) 154/79  Pulse:  85  Resp:  18  Temp:  36.6 C  SpO2:  96%    Last Pain:  Vitals:   07/22/18 1626  TempSrc: Temporal  PainSc: [place]

## 2018-07-22 NOTE — Interval H&P Note (Signed)
History and Physical Interval Note:  07/22/2018 12:45 PM  Bridget Green  has presented today for surgery, with the diagnosis of right breast mass  The various methods of treatment have been discussed with the patient and family. After consideration of risks, benefits and other options for treatment, the patient has consented to  Procedure(s): BREAST LUMPECTOMY WITH NEEDLE LOCALIZATION (Right) as a surgical intervention .  The patient's history has been reviewed, patient examined, no change in status, stable for surgery.  I have reviewed the patient's chart and labs.  Questions were answered to the patient's satisfaction.     Seamus Warehime

## 2018-07-22 NOTE — Op Note (Signed)
  Procedure Date:  07/22/2018  Pre-operative Diagnosis:  Right breast sclerosing lesion  Post-operative Diagnosis:  Right breast sclerosing lesion  Procedure:  Right breast wire-localized lumpectomy  Surgeon:  Melvyn Neth, MD  Anesthesia:  General endotracheal  Estimated Blood Loss:  10 ml  Specimens:  Right breast mass  Complications:  None  Indications for Procedure:  This is a 63 y.o. female who presents with right breast sclerosing lesion with PASH, duct ectasia, and apocrine metaplasia.  The risks of bleeding, infection, injury to surrounding structures, hematoma, seroma, open wound, cosmetic deformity, and the need for further surgery were all discussed with the patient and was willing to proceed.  Prior to this procedure, the patient had undergone wire localization and images were reviewed.  Mass is located posteriorly in the right upper quadrant, with wire distance about 7 cm to clip.  Description of Procedure: The patient was correctly identified in the preoperative area and brought into the operating room.  The patient was placed supine with VTE prophylaxis in place.  Appropriate time-outs were performed.  Anesthesia was induced and the patient was intubated.  Appropriate antibiotics were infused.  The right chest and axilla were prepped and draped in usual sterile fashion.  Attention was turned to the needle localization site which was located laterally and posteriorly.  A 7 cm incision was made encompassing the needle insertion site. Elecrocautery was used for dissection around the needle to perform a partial mastectomy with adequate margins.  The posterior margin was the chest wall.  2-0 silk suture was used to mark the specimen short superior and long lateral.  The specimen including the wire was sent to radiology which confirmed an intact wire and prior biopsy site clip.  The cavity was irrigated and hemostasis was assured with electrocautery.  Local anesthetic was  infiltrated into the skin, subcutaneous tissue, and chest wall muscle of the cavity.  The wound was then closed in two layers with 3-0 Vicryl and 4-0 Monocryl and sealed with DermaBond.  Fluff gauze and breast binder were placed.   The patient was emerged from anesthesia and extubated and brought to the recovery room for further management.  The patient tolerated the procedure well and all counts were correct at the end of the case.   Melvyn Neth, MD

## 2018-07-23 ENCOUNTER — Encounter: Payer: Self-pay | Admitting: Surgery

## 2018-07-24 ENCOUNTER — Telehealth: Payer: Self-pay | Admitting: *Deleted

## 2018-07-24 LAB — SURGICAL PATHOLOGY

## 2018-07-24 NOTE — Telephone Encounter (Signed)
Spoke with patient and she would like a note for her work to be able to work from home until evaluated at her post operative appointment 08/06/2018.  Note provided and placed at front desk for pick up.

## 2018-07-24 NOTE — Telephone Encounter (Signed)
Patient called and wanted a updated note staying that its okay for her to work from home

## 2018-07-25 ENCOUNTER — Telehealth: Payer: Self-pay

## 2018-07-25 NOTE — Telephone Encounter (Signed)
-----   Message from Margo Common, [place] sent at 07/25/2018  8:19 AM EST ----- Pathology report confirms benign lesion. Follow up with surgeon as he recommends and recheck mammograms in a year.

## 2018-07-25 NOTE — Telephone Encounter (Signed)
Patient was advised.  

## 2018-07-25 NOTE — Addendum Note (Signed)
Addendum  created 07/25/18 0935 by Doreen Salvage, CRNA   [employer] section accepted

## 2018-08-06 ENCOUNTER — Encounter: Payer: Self-pay | Admitting: Surgery

## 2018-08-06 ENCOUNTER — Ambulatory Visit (INDEPENDENT_AMBULATORY_CARE_PROVIDER_SITE_OTHER): Payer: BLUE CROSS/BLUE SHIELD | Admitting: Surgery

## 2018-08-06 ENCOUNTER — Other Ambulatory Visit: Payer: Self-pay

## 2018-08-06 VITALS — BP 113/58 | HR 66 | Temp 98.5°F | Resp 16 | Ht 67.0 in | Wt 198.0 lb

## 2018-08-06 DIAGNOSIS — Z09 Encounter for follow-up examination after completed treatment for conditions other than malignant neoplasm: Secondary | ICD-10-CM

## 2018-08-06 DIAGNOSIS — N6310 Unspecified lump in the right breast, unspecified quadrant: Secondary | ICD-10-CM

## 2018-08-06 NOTE — Progress Notes (Signed)
08/06/2018  HPI: ALEAHA FICKLING is a 63 y.o. female s/p right breast wire localized lumpectomy on 07/22/18.  Her pathology showed benign findings, specifically radial scar/complex sclerosing lesion with usual ductal hyperplasia, intraductal papilloma, and fibrocystic changes.  Patient reports she's doing very well.  She did not have to take any narcotics for pain control and is back to working full-time.  She's very appreciative of her care in office and in hospital.  Vital signs: BP (!) 113/58   Pulse 66   Temp 98.5 F (36.9 C) (Oral)   Resp 16   Ht 5\' 7"  (1.702 m)   Wt 198 lb (89.8 kg)   SpO2 99%   BMI 31.01 kg/m    Physical Exam: Constitutional:  No acute distress Breast:  Right breast lateral scar healing well without any breakdown or evidence of infection.  There is resolving ecchymosis anterior to the incision.  No significant tenderness to palpation.  No palpable masses.  Assessment/Plan: This is a 63 y.o. female s/p right breast wire localized lumpectomy  --Patient is healing well and there are no post-op complications.  Reviewed pathology results with her again. --Recommended that she continue with her yearly mammogram schedule.  Offered the patient to follow up with me in a year.  She would prefer to get her mammogram at work as it is included in her benefits and let us know if she needs anything.  She may follow up with [place] on an as needed basis.   Melvyn Neth, [facility]

## 2018-08-06 NOTE — Patient Instructions (Addendum)
Please call office with any questions or concerns.

## 2018-08-07 ENCOUNTER — Other Ambulatory Visit: Payer: Self-pay | Admitting: Family Medicine

## 2018-08-07 DIAGNOSIS — E7800 Pure hypercholesterolemia, unspecified: Secondary | ICD-10-CM

## 2018-08-17 NOTE — Progress Notes (Signed)
[name], Female    DOB: 1956-02-14, 63 y.o.   MRN: [id number] Visit Date: 08/19/2018  Today's Provider: Vernie Murders, PA   Chief Complaint  Patient presents with  . Annual Exam   Subjective:     Annual physical exam Bridget Green is a 63 y.o. female who presents today for health maintenance and complete physical. She feels well. She reports exercising none. She reports she is sleeping well.  ---------------------------------------------------------------  Review of Systems  All other systems reviewed and are negative.  Social History      She  reports that she has never smoked. She has never used smokeless tobacco. She reports current alcohol use. She reports that she does not use drugs.       Social History   Socioeconomic History  . Marital status: Married    Spouse name: Not on file  . Number of children: Not on file  . Years of education: Not on file  . Highest education level: Not on file  Occupational History  . Not on file  Social Needs  . Financial resource strain: Not on file  . Food insecurity:    Worry: Not on file    Inability: Not on file  . Transportation needs:    Medical: Not on file    Non-medical: Not on file  Tobacco Use  . Smoking status: Never Smoker  . Smokeless tobacco: Never Used  Substance and Sexual Activity  . Alcohol use: Yes    Alcohol/week: 0.0 standard drinks    Comment: OCCASIONALLY  . Drug use: No  . Sexual activity: Not on file  Lifestyle  . Physical activity:    Days per week: Not on file    Minutes per session: Not on file  . Stress: Not on file  Relationships  . Social connections:    Talks on phone: Not on file    Gets together: Not on file    Attends religious service: Not on file    Active member of club or organization: Not on file    Attends meetings of clubs or organizations: Not on file    Relationship status: Not on file  Other Topics Concern  . Not on file  Social History  Narrative  . Not on file   Past Medical History:  Diagnosis Date  . Arthritis   . Family history of adverse reaction to anesthesia    DAD-HARD TO WAKE UP  . GERD (gastroesophageal reflux disease)   . History of hiatal hernia   . Hyperlipidemia   . Hypertension    Patient Active Problem List   Diagnosis Date Noted  . Breast mass, right   . Esophagitis, reflux 03/23/2015  . Herpes zona 03/23/2015  . Tendon nodule 03/23/2015  . Avitaminosis D 03/23/2015  . Ache in joint 08/04/2014  . Restless leg 11/16/2009  . Disequilibrium 02/22/2009  . Blood in feces 11/09/2008  . Hematochezia 11/09/2008  . Asthma, exogenous 04/10/2008  . Essential (primary) hypertension 01/07/2007  . Breathlessness on exertion 09/11/2006  . Acid reflux 03/30/2006  . Hypercholesterolemia without hypertriglyceridemia 03/30/2006   Past Surgical History:  Procedure Laterality Date  . ADENOIDECTOMY  1960  . APPENDECTOMY  1983  . BREAST BIOPSY Right 06/20/2018   stereo for distortion  SMALL COMPLEX SCLEROSING LESION  . BREAST EXCISIONAL BIOPSY Right 07/22/2018   NP for  SMALL COMPLEX SCLEROSING LESION  . BREAST LUMPECTOMY WITH NEEDLE LOCALIZATION Right 07/22/2018   Procedure:  BREAST LUMPECTOMY WITH NEEDLE LOCALIZATION;  Surgeon: Olean Ree, MD;  Location: [facility] ORS;  Service: General;  Laterality: Right;  . CESAREAN SECTION  [id number]  . CHOLECYSTECTOMY  12/2011  . LASIK Bilateral 2008  . [place] History        Family Status  Relation Name Status  . Father  Deceased at age 1  . Sister  Alive  . MGM  Deceased at age 35  . MGF  Deceased  . PGM  Deceased  . PGF  Deceased  . Mother  (Not Specified)  . Cousin  (Not Specified)        Her family history includes Aneurysm in her paternal grandfather; Arthritis in her mother and sister; Breast cancer in her cousin; Colon cancer in her father; Heart attack in her maternal grandfather and paternal grandmother; Hyperlipidemia in her  mother; Hypertension in her mother and sister; Thyroid cancer in her paternal grandmother.     Allergies  Allergen Reactions  . Penicillins Itching and Other (See Comments)    DID THE REACTION INVOLVE: Swelling of the face/tongue/throat, SOB, or low BP? No Sudden or severe rash/hives, skin peeling, or the inside of the mouth or nose? No Did it require medical treatment? No When did it last happen? Unknown age If all above answers are "NO", may proceed with cephalosporin use.   . Sulfa Antibiotics Itching    Current Outpatient Medications:  .  ezetimibe (ZETIA) 10 MG tablet, TAKE 1 TABLET BY MOUTH DAILY, Disp: 90 tablet, Rfl: 3 .  fluticasone (FLONASE) 50 MCG/ACT nasal spray, PLACE 2 SPRAYS INTO BOTH NOSTRILS AT BEDTIME (Patient taking differently: Place 1 spray into both nostrils daily as needed for allergies. ), Disp: 16 g, Rfl: 3 .  ibuprofen (ADVIL,MOTRIN) 600 MG tablet, Take 1 tablet (600 mg total) by mouth every 8 (eight) hours as needed for fever, mild pain or moderate pain., Disp: 30 tablet, Rfl: 0 .  lisinopril-hydrochlorothiazide (PRINZIDE,ZESTORETIC) 20-12.5 MG tablet, TAKE ONE TABLET BY MOUTH EVERY DAY (Patient taking differently: Take 1 tablet by mouth daily. ), Disp: 90 tablet, Rfl: 3 .  omeprazole (PRILOSEC) 20 MG capsule, TAKE 1 CAPSULE BY MOUTH ONCE DAILY (Patient taking differently: Take 20 mg by mouth every morning. ), Disp: 90 capsule, Rfl: 3   Patient Care Team: Shaughnessy Gethers, Vickki Muff, PA as PCP - General (Physician Assistant)    Objective:    Vitals: BP 110/60 (BP Location: Right Arm, Patient Position: Sitting, Cuff Size: Large)   Pulse 72   Temp 97.7 F (36.5 C) (Oral)   Resp 16   Ht 5\' 7"  (1.702 m)   Wt 198 lb (89.8 kg)   SpO2 98%   BMI 31.01 kg/m    Vitals:   08/19/18 0930  BP: 110/60  Pulse: 72  Resp: 16  Temp: 97.7 F (36.5 C)  TempSrc: Oral  SpO2: 98%  Weight: 198 lb (89.8 kg)  Height: 5\' 7"  (1.702 m)    Physical Exam Constitutional:       Appearance: She is well-developed.  HENT:     Head: Normocephalic and atraumatic.     Right Ear: Tympanic membrane and external ear normal.     Left Ear: Tympanic membrane and external ear normal.     Ears:     Comments: Hearing loss with compensation by hearing aids.    Nose: Nose normal.  Eyes:     General:        Right eye: No discharge.  Conjunctiva/sclera: Conjunctivae normal.     Pupils: Pupils are equal, round, and reactive to light.  Neck:     Musculoskeletal: Normal range of motion and neck supple.     Thyroid: No thyromegaly.     Trachea: No tracheal deviation.  Cardiovascular:     Rate and Rhythm: Normal rate and regular rhythm.     Heart sounds: Normal heart sounds. No murmur.  Pulmonary:     Effort: Pulmonary effort is normal. No respiratory distress.     Breath sounds: Normal breath sounds. No wheezing or rales.  Chest:     Chest wall: No tenderness.  Abdominal:     General: There is no distension.     Palpations: Abdomen is soft. There is no mass.     Tenderness: There is no abdominal tenderness. There is no guarding or rebound.  Genitourinary:    General: Normal vulva.     Rectum: Normal. Guaiac result negative.  Musculoskeletal: Normal range of motion.        General: No tenderness.  Lymphadenopathy:     Cervical: No cervical adenopathy.  Skin:    General: Skin is warm and dry.     Findings: No erythema or rash.  Neurological:     Mental Status: She is alert and oriented to person, place, and time.     Cranial Nerves: No cranial nerve deficit.     Motor: No abnormal muscle tone.     Coordination: Coordination normal.     Deep Tendon Reflexes: Reflexes are normal and symmetric. Reflexes normal.  Psychiatric:        Behavior: Behavior normal.        Thought Content: Thought content normal.        Judgment: Judgment normal.     Depression Screen PHQ 2/9 Scores 08/19/2018 08/24/2017 08/19/2015  PHQ - 2 Score 0 0 0  PHQ- 9 Score 1 0 -       Assessment & Plan:     Routine Health Maintenance and Physical Exam  Exercise Activities and Dietary recommendations Goals   Recommend 6-8 glasses of water daily and 30-40 minutes of exercise 3-4 days a week.     Immunization History  Administered Date(s) Administered  . Influenza,inj,Quad PF,6+ Mos 04/24/2017  . Influenza-Unspecified 05/10/2017, 04/09/2018  . Tdap 03/26/2015  . Zoster 11/27/2014    Health Maintenance  Topic Date Due  . PAP SMEAR-Modifier  08/18/2018  . COLONOSCOPY  08/28/2018  . MAMMOGRAM  05/30/2020  . TETANUS/TDAP  03/25/2025  . INFLUENZA VACCINE  Completed  . Hepatitis C Screening  Completed  . HIV Screening  Completed     Discussed health benefits of physical activity, and encouraged her to engage in regular exercise appropriate for her age and condition.    -------------------------------------------------------------------- 1. Annual physical exam General health good. Given anticipatory counseling. Immunizations up to date. Screening tests scheduled for cervical cancer, colon cancer and osteoporosis.  2. Essential (primary) hypertension Well controlled. Tolerating Prinzide 20-12.5 mg qd without side effects. Check CBC, TSH, CMP and Lipid Panel. Continue to restrict salt intake and follow up pending reports. - CBC with Differential/Platelet - Comprehensive metabolic panel - Lipid panel - TSH  3. Esophagitis, reflux Denies hematemesis but having more reflux and dyspepsia despite Omeprazole 20 mg qd. Requests referral to GI for possible upper endoscopy evaluation with history of hiatal hernia. Stool negative for occult blood today. - CBC with Differential/Platelet - Ambulatory referral to Gastroenterology  4. Hypercholesterolemia without hypertriglyceridemia Still taking the  Zetia 10 mg qd and trying to follow a low fat diet. Will recheck routine labs and follow up pending reports. - CBC with Differential/Platelet - Comprehensive metabolic  panel - Lipid panel - TSH  5. Avitaminosis D Recommend Vitamin D 2000 IU qd. Will recheck blood levels. - CBC with Differential/Platelet - VITAMIN D 25 Hydroxy (Vit-D Deficiency, Fractures)  6. Breast mass, right Benign pathology report from right breast lesion excision 07-22-18 by Dr. Hampton Abbot (surgeon). Will get follow up with surgeon as planned and screening mammograms in a year.  7. Screening for colon cancer Last colonoscopy screening was benign on 08-28-08. No hematochezia, melena or abdominal pain. Schedule 10 year screening. - Ambulatory referral to Gastroenterology  8. Screening for osteoporosis History of low vitamin D level and last BMD on 09-07-15 showed low risk for osteoporotic fractures. Will re-screen this year. - DG Bone Density - VITAMIN D 25 Hydroxy (Vit-D Deficiency, Fractures)  9. Pap smear for cervical cancer screening Normal exam without discharge or adnexal masses. PAP smear obtained. - Cytology - PAP    Vernie Murders, PA  [facility]

## 2018-08-19 ENCOUNTER — Ambulatory Visit (INDEPENDENT_AMBULATORY_CARE_PROVIDER_SITE_OTHER): Payer: BLUE CROSS/BLUE SHIELD | Admitting: Family Medicine

## 2018-08-19 ENCOUNTER — Encounter: Payer: Self-pay | Admitting: Family Medicine

## 2018-08-19 ENCOUNTER — Other Ambulatory Visit (HOSPITAL_COMMUNITY)
Admission: RE | Admit: 2018-08-19 | Discharge: 2018-08-19 | Disposition: A | Discharge status: Home / Self Care | Payer: BLUE CROSS/BLUE SHIELD | Source: Ambulatory Visit | Attending: Family Medicine | Admitting: Family Medicine

## 2018-08-19 VITALS — BP 110/60 | HR 72 | Temp 97.7°F | Resp 16 | Ht 67.0 in | Wt 198.0 lb

## 2018-08-19 DIAGNOSIS — K2100 Gastro-esophageal reflux disease with esophagitis, without bleeding: Secondary | ICD-10-CM

## 2018-08-19 DIAGNOSIS — Z1211 Encounter for screening for malignant neoplasm of colon: Secondary | ICD-10-CM

## 2018-08-19 DIAGNOSIS — I10 Essential (primary) hypertension: Secondary | ICD-10-CM | POA: Diagnosis not present

## 2018-08-19 DIAGNOSIS — Z124 Encounter for screening for malignant neoplasm of cervix: Secondary | ICD-10-CM | POA: Diagnosis not present

## 2018-08-19 DIAGNOSIS — N6310 Unspecified lump in the right breast, unspecified quadrant: Secondary | ICD-10-CM

## 2018-08-19 DIAGNOSIS — Z0000 Encounter for general adult medical examination without abnormal findings: Secondary | ICD-10-CM

## 2018-08-19 DIAGNOSIS — E559 Vitamin D deficiency, unspecified: Secondary | ICD-10-CM | POA: Diagnosis not present

## 2018-08-19 DIAGNOSIS — E7800 Pure hypercholesterolemia, unspecified: Secondary | ICD-10-CM

## 2018-08-19 DIAGNOSIS — Z13820 Encounter for screening for osteoporosis: Secondary | ICD-10-CM

## 2018-08-20 ENCOUNTER — Other Ambulatory Visit: Payer: Self-pay | Admitting: *Deleted

## 2018-08-20 LAB — LIPID PANEL
Chol/HDL Ratio: 3.2 ratio (ref 0.0–4.4)
Cholesterol, Total: 229 mg/dL — ABNORMAL HIGH (ref 100–199)
HDL: 71 mg/dL (ref >39)
LDL Calculated: 133 mg/dL — ABNORMAL HIGH (ref 0–99)
Triglycerides: 126 mg/dL (ref 0–149)
VLDL Cholesterol Cal: 25 mg/dL (ref 5–40)

## 2018-08-20 LAB — CBC WITH DIFFERENTIAL/PLATELET
Basophils Absolute: 0 10*3/uL (ref 0.0–0.2)
Basos: 0 %
EOS (ABSOLUTE): 0.1 10*3/uL (ref 0.0–0.4)
Eos: 1 %
Hematocrit: 43.1 % (ref 34.0–46.6)
Hemoglobin: 14.4 g/dL (ref 11.1–15.9)
Immature Grans (Abs): 0 10*3/uL (ref 0.0–0.1)
Immature Granulocytes: 0 %
Lymphocytes Absolute: 3.1 10*3/uL (ref 0.7–3.1)
Lymphs: 51 %
MCH: 29.8 pg (ref 26.6–33.0)
MCHC: 33.4 g/dL (ref 31.5–35.7)
MCV: 89 fL (ref 79–97)
Monocytes Absolute: 0.4 10*3/uL (ref 0.1–0.9)
Monocytes: 7 %
Neutrophils Absolute: 2.5 10*3/uL (ref 1.4–7.0)
Neutrophils: 41 %
Platelets: 219 10*3/uL (ref 150–450)
RBC: 4.83 x10E6/uL (ref 3.77–5.28)
RDW: 12.9 % (ref 11.7–15.4)
WBC: 6.1 10*3/uL (ref 3.4–10.8)

## 2018-08-20 LAB — CYTOLOGY - PAP
Diagnosis: NEGATIVE
HPV: NOT DETECTED

## 2018-08-20 LAB — COMPREHENSIVE METABOLIC PANEL
ALT: 22 IU/L (ref 0–32)
AST: 20 IU/L (ref 0–40)
Albumin/Globulin Ratio: 1.7 (ref 1.2–2.2)
Albumin: 4.4 g/dL (ref 3.8–4.8)
Alkaline Phosphatase: 94 IU/L (ref 39–117)
BUN/Creatinine Ratio: 15 (ref 12–28)
BUN: 13 mg/dL (ref 8–27)
Bilirubin Total: 0.4 mg/dL (ref 0.0–1.2)
CO2: 23 mmol/L (ref 20–29)
Calcium: 9.5 mg/dL (ref 8.7–10.3)
Chloride: 102 mmol/L (ref 96–106)
Creatinine, Ser: 0.84 mg/dL (ref 0.57–1.00)
GFR calc non Af Amer: 74 mL/min/{1.73_m2} (ref >59)
GFR, EST AFRICAN AMERICAN: 85 mL/min/{1.73_m2} (ref >59)
Globulin, Total: 2.6 g/dL (ref 1.5–4.5)
Glucose: 97 mg/dL (ref 65–99)
Potassium: 4.1 mmol/L (ref 3.5–5.2)
Sodium: 140 mmol/L (ref 134–144)
TOTAL PROTEIN: 7 g/dL (ref 6.0–8.5)

## 2018-08-20 LAB — VITAMIN D 25 HYDROXY (VIT D DEFICIENCY, FRACTURES): Vit D, 25-Hydroxy: 25.1 ng/mL — ABNORMAL LOW (ref 30.0–100.0)

## 2018-08-20 LAB — TSH: TSH: 1.23 u[IU]/mL (ref 0.450–4.500)

## 2018-08-28 ENCOUNTER — Encounter: Payer: Self-pay | Admitting: Gastroenterology

## 2018-08-28 DIAGNOSIS — D2262 Melanocytic nevi of left upper limb, including shoulder: Secondary | ICD-10-CM | POA: Diagnosis not present

## 2018-08-28 DIAGNOSIS — L570 Actinic keratosis: Secondary | ICD-10-CM | POA: Diagnosis not present

## 2018-08-28 DIAGNOSIS — D2261 Melanocytic nevi of right upper limb, including shoulder: Secondary | ICD-10-CM | POA: Diagnosis not present

## 2018-08-28 DIAGNOSIS — D225 Melanocytic nevi of trunk: Secondary | ICD-10-CM | POA: Diagnosis not present

## 2018-09-20 ENCOUNTER — Encounter: Payer: Self-pay | Admitting: Gastroenterology

## 2018-09-20 ENCOUNTER — Other Ambulatory Visit: Payer: Self-pay

## 2018-09-20 ENCOUNTER — Ambulatory Visit: Payer: BLUE CROSS/BLUE SHIELD | Admitting: Gastroenterology

## 2018-09-20 VITALS — BP 138/81 | Ht 67.0 in | Wt 197.4 lb

## 2018-09-20 DIAGNOSIS — Z9189 Other specified personal risk factors, not elsewhere classified: Secondary | ICD-10-CM | POA: Diagnosis not present

## 2018-09-20 DIAGNOSIS — K219 Gastro-esophageal reflux disease without esophagitis: Secondary | ICD-10-CM | POA: Diagnosis not present

## 2018-09-20 DIAGNOSIS — Z800 Family history of malignant neoplasm of digestive organs: Secondary | ICD-10-CM

## 2018-09-20 NOTE — Patient Instructions (Signed)

## 2018-09-20 NOTE — Progress Notes (Addendum)
Cephas Darby, MD [address]  [place]  [place], [place] [address]  Main: [phone]  Fax: [phone]    Gastroenterology Consultation  Referring Provider:     Margo Common, PA Primary Care Physician:  Margo Common, PA Primary Gastroenterologist:  Dr. Cephas Darby Reason for Consultation:     Discuss about screening colonoscopy as well as GERD        HPI:   RHYA SHAN is a 63 y.o. female referred by Dr. Margo Common, PA  for consultation & management of chronic symptoms of heartburn.  She reports having intermittent heartburn particularly triggered after eating certain foods as well as when she has late dinner.  She denies dysphagia, epigastric pain, regurgitation, nausea or vomiting.  She reports that her heartburn has significantly improved since cholecystectomy in 2013. She takes omeprazole 20 mg daily before breakfast which provides relief, has been on it for few years.  She sometimes keeps her head of the bed elevated during sleep.  She had EGD in 2013 which was unremarkable.  She would like to discuss about screening colonoscopy as well.  Her father diagnosed with colon cancer at age 65.  She has been receiving surveillance colonoscopy every 5 years and reportedly normal.  She is currently due for one.  She would like to schedule the colonoscopy in September 2020 when she turns 51 so that she is covered by Medicare.  Patient reports that she has gained significant weight in last 1 year.  CBC, CMP unremarkable She is status post cholecystectomy in 11/2011  NSAIDs: None  Antiplts/Anticoagulants/Anti thrombotics: None  GI Procedures:  EGD in 2013 by Dr. Jamal Collin Diagnosis:  Part A: GASTRIC CARDIA POLYP BIOPSIES:  - OXYNTIC MUCOSA WITH CHANGES SUGGESTIVE OF PROTON PUMP INHIBITOR  EFFECT; AN EARLY FUNDIC GLAND POLYP IS A POSSIBILITY.  - NO GASTRITIS OR DYSPLASIA.  - NEGATIVE FOR HELICOBACTER PYLORI ON DIFF-QUIK STAIN.  Marland Kitchen  Part B:  GASTROESOPHAGEAL JUNCTION BIOPSIES:  - GASTRIC OXYNTIC-TYPE MUCOSA WITH FOCAL REACTIVE EPITHELIAL  CHANGES AND MILD CHRONIC INFLAMMATION.  - NO INTESTINAL METAPLASIA IN THIS SAMPLE.   Past Medical History:  Diagnosis Date  . Arthritis   . Family history of adverse reaction to anesthesia    DAD-HARD TO WAKE UP  . GERD (gastroesophageal reflux disease)   . History of hiatal hernia   . Hyperlipidemia   . Hypertension     Past Surgical History:  Procedure Laterality Date  . ADENOIDECTOMY  1960  . APPENDECTOMY  1983  . BREAST BIOPSY Right 06/20/2018   stereo for distortion  SMALL COMPLEX SCLEROSING LESION  . BREAST EXCISIONAL BIOPSY Right 07/22/2018   NP for  SMALL COMPLEX SCLEROSING LESION  . BREAST LUMPECTOMY WITH NEEDLE LOCALIZATION Right 07/22/2018   Procedure: BREAST LUMPECTOMY WITH NEEDLE LOCALIZATION;  Surgeon: Olean Ree, MD;  Location: [facility] ORS;  Service: General;  Laterality: Right;  . CESAREAN SECTION  [id number]  . CHOLECYSTECTOMY  12/2011  . LASIK Bilateral 2008  . TONSILLECTOMY  1960    Current Outpatient Medications:  .  ezetimibe (ZETIA) 10 MG tablet, TAKE 1 TABLET BY MOUTH DAILY, Disp: 90 tablet, Rfl: 3 .  fluticasone (FLONASE) 50 MCG/ACT nasal spray, PLACE 2 SPRAYS INTO BOTH NOSTRILS AT BEDTIME (Patient taking differently: Place 1 spray into both nostrils daily as needed for allergies. ), Disp: 16 g, Rfl: 3 .  lisinopril-hydrochlorothiazide (PRINZIDE,ZESTORETIC) 20-12.5 MG tablet, TAKE ONE TABLET BY MOUTH EVERY DAY (Patient taking differently: Take 1  tablet by mouth daily. ), Disp: 90 tablet, Rfl: 3 .  Omega-3 Fatty Acids (FISH OIL PO), Take by mouth., Disp: , Rfl:  .  omeprazole (PRILOSEC) 20 MG capsule, TAKE 1 CAPSULE BY MOUTH ONCE DAILY (Patient taking differently: Take 20 mg by mouth every morning. ), Disp: 90 capsule, Rfl: 3 .  VITAMIN D PO, Take by mouth., Disp: , Rfl:  .  ibuprofen (ADVIL,MOTRIN) 600 MG tablet, Take 1 tablet (600 mg total) by mouth every 8  (eight) hours as needed for fever, mild pain or moderate pain. (Patient not taking: Reported on 09/20/2018), Disp: 30 tablet, Rfl: 0    Family History  Problem Relation Age of Onset  . Colon cancer Father   . Arthritis Sister   . Hypertension Sister   . Heart attack Maternal Grandfather   . Heart attack Paternal Grandmother   . Thyroid cancer Paternal Grandmother   . Aneurysm Paternal Grandfather   . Arthritis Mother   . Hypertension Mother   . Hyperlipidemia Mother   . Breast cancer Cousin      Social History   Tobacco Use  . Smoking status: Never Smoker  . Smokeless tobacco: Never Used  Substance Use Topics  . Alcohol use: Yes    Alcohol/week: 0.0 standard drinks    Comment: OCCASIONALLY  . Drug use: No    Allergies as of 09/20/2018 - Review Complete 09/20/2018  Allergen Reaction Noted  . Penicillins Itching and Other (See Comments) 03/23/2015  . Sulfa antibiotics Itching 03/23/2015    Review of Systems:    All systems reviewed and negative except where noted in HPI.   Physical Exam:  BP 138/81 (BP Location: Right Arm, Patient Position: Sitting, Cuff Size: Large)   Ht 5\' 7"  (1.702 m)   Wt 197 lb 6.4 oz (89.5 kg)   BMI 30.92 kg/m  No LMP recorded. Patient is postmenopausal.  General:   Alert,  Well-developed, well-nourished, pleasant and cooperative in NAD Head:  Normocephalic and atraumatic. Eyes:  Sclera clear, no icterus.   Conjunctiva pink. Ears:  Normal auditory acuity. Nose:  No deformity, discharge, or lesions. Mouth:  No deformity or lesions,oropharynx pink & moist. Neck:  Supple; no masses or thyromegaly. Lungs:  Respirations even and unlabored.  Clear throughout to auscultation.   No wheezes, crackles, or rhonchi. No acute distress. Heart:  Regular rate and rhythm; no murmurs, clicks, rubs, or gallops. Abdomen:  Normal bowel sounds. Soft, non-tender and non-distended without masses, hepatosplenomegaly or hernias noted.  No guarding or rebound  tenderness.   Rectal: Not performed Msk:  Symmetrical without gross deformities. Good, equal movement & strength bilaterally. Pulses:  Normal pulses noted. Extremities:  No clubbing or edema.  No cyanosis. Neurologic:  Alert and oriented x3;  grossly normal neurologically. Skin:  Intact without significant lesions or rashes. No jaundice. Lymph Nodes:  No significant cervical adenopathy. Psych:  Alert and cooperative. Normal mood and affect.  Imaging Studies: None  Assessment and Plan:   Bridget Green is a 63 y.o. pleasant Caucasian female status post cholecystectomy with chronic intermittent heartburn relieved on omeprazole 20 mg daily, triggered with certain foods, without dysphagia, had recent weight gain, family history of colon cancer in first-degree relative  Intermittent heartburn EGD in 2013 was unremarkable Advised her about antireflux measures Suggested her to stay active, eat healthy and lose some weight Okay to continue omeprazole 20 mg daily Can perform EGD in 6 months if her symptoms are persistent despite above measures  Family  history of colon cancer Schedule surveillance colonoscopy when she turns 81.  Patient will call my office to schedule colonoscopy   Follow up as needed   Cephas Darby, MD

## 2018-10-09 ENCOUNTER — Other Ambulatory Visit: Payer: BLUE CROSS/BLUE SHIELD

## 2018-11-04 ENCOUNTER — Other Ambulatory Visit: Payer: Self-pay | Admitting: Family Medicine

## 2018-11-04 DIAGNOSIS — I10 Essential (primary) hypertension: Secondary | ICD-10-CM

## 2019-03-10 ENCOUNTER — Encounter: Payer: Self-pay | Admitting: Family Medicine

## 2019-03-10 ENCOUNTER — Ambulatory Visit (INDEPENDENT_AMBULATORY_CARE_PROVIDER_SITE_OTHER): Payer: 59 | Admitting: Family Medicine

## 2019-03-10 ENCOUNTER — Other Ambulatory Visit: Payer: Self-pay

## 2019-03-10 VITALS — BP 136/70 | HR 64 | Temp 97.3°F | Resp 16 | Ht 64.0 in | Wt 196.8 lb

## 2019-03-10 DIAGNOSIS — Z23 Encounter for immunization: Secondary | ICD-10-CM | POA: Diagnosis not present

## 2019-03-10 DIAGNOSIS — E7800 Pure hypercholesterolemia, unspecified: Secondary | ICD-10-CM

## 2019-03-10 DIAGNOSIS — I10 Essential (primary) hypertension: Secondary | ICD-10-CM

## 2019-03-10 DIAGNOSIS — Z1231 Encounter for screening mammogram for malignant neoplasm of breast: Secondary | ICD-10-CM | POA: Diagnosis not present

## 2019-03-10 NOTE — Progress Notes (Signed)
[name] Female    DOB: 04/05/1956   63 y.o.   MRN: [id number] Visit Date: 03/10/2019  Today's Provider: Vernie Murders, PA   Chief Complaint  Patient presents with  . Follow-up   Subjective:     HPI   Hypertension, follow-up:  BP Readings from Last 3 Encounters:  03/10/19 136/70  09/20/18 138/81  08/19/18 110/60    She was last seen for hypertension 6 months ago.  BP at that visit was 110/60. Management changes since that visit include check labs. She reports excellent compliance with treatment. She is not having side effects.  She is exercising. She is adherent to low salt diet.   Outside blood pressures are stable. She is experiencing lower extremity edema.  Patient denies chest pain.   Cardiovascular risk factors include hypertension and obesity (BMI >= 30 kg/m2).  Use of agents associated with hypertension: none.     Weight trend: stable Wt Readings from Last 3 Encounters:  03/10/19 196 lb 12.8 oz (89.3 kg)  09/20/18 197 lb 6.4 oz (89.5 kg)  08/19/18 198 lb (89.8 kg)    Current diet: not asked  ------------------------------------------------------------------------  Lipid/Cholesterol, Follow-up:   Last seen for this 6 months ago.  Management changes since that visit include check labs. . Last Lipid Panel:    Component Value Date/Time   CHOL 229 (H) 08/19/2018 1052   TRIG 126 08/19/2018 1052   HDL 71 08/19/2018 1052   CHOLHDL 3.2 08/19/2018 1052   LDLCALC 133 (H) 08/19/2018 1052    Risk factors for vascular disease include hypertension  She reports excellent compliance with treatment. She is not having side effects.  Current symptoms include none and have been stable. Weight trend: stable Prior visit with dietician: no Current diet: not asked Current exercise: housecleaning and no regular exercise  Wt Readings from Last 3 Encounters:  03/10/19 196 lb 12.8 oz (89.3 kg)  09/20/18 197 lb 6.4 oz (89.5 kg)  08/19/18  198 lb (89.8 kg)    -------------------------------------------------------------------  Past Medical History:  Diagnosis Date  . Arthritis   . Family history of adverse reaction to anesthesia    DAD-HARD TO WAKE UP  . GERD (gastroesophageal reflux disease)   . History of hiatal hernia   . Hyperlipidemia   . Hypertension    Past Surgical History:  Procedure Laterality Date  . ADENOIDECTOMY  1960  . APPENDECTOMY  1983  . BREAST BIOPSY Right 06/20/2018   stereo for distortion  SMALL COMPLEX SCLEROSING LESION  . BREAST EXCISIONAL BIOPSY Right 07/22/2018   NP for  SMALL COMPLEX SCLEROSING LESION  . BREAST LUMPECTOMY WITH NEEDLE LOCALIZATION Right 07/22/2018   Procedure: BREAST LUMPECTOMY WITH NEEDLE LOCALIZATION;  Surgeon: Olean Ree, MD;  Location: [facility] ORS;  Service: General;  Laterality: Right;  . CESAREAN SECTION  [id number]  . CHOLECYSTECTOMY  12/2011  . LASIK Bilateral 2008  . TONSILLECTOMY  1960   Family History  Problem Relation Age of Onset  . Colon cancer Father   . Arthritis Sister   . Hypertension Sister   . Heart attack Maternal Grandfather   . Heart attack Paternal Grandmother   . Thyroid cancer Paternal Grandmother   . Aneurysm Paternal Grandfather   . Arthritis Mother   . Hypertension Mother   . Hyperlipidemia Mother   . Breast cancer Cousin     Allergies  Allergen Reactions  . Penicillins Itching and Other (See Comments)    DID THE REACTION  INVOLVE: Swelling of the face/tongue/throat, SOB, or low BP? No Sudden or severe rash/hives, skin peeling, or the inside of the mouth or nose? No Did it require medical treatment? No When did it last happen? Unknown age If all above answers are "NO", may proceed with cephalosporin use.   . Sulfa Antibiotics Itching    Current Outpatient Medications:  .  ezetimibe (ZETIA) 10 MG tablet, TAKE 1 TABLET BY MOUTH DAILY, Disp: 90 tablet, Rfl: 3 .  fluticasone (FLONASE) 50 MCG/ACT nasal spray, PLACE 2 SPRAYS INTO  BOTH NOSTRILS AT BEDTIME (Patient taking differently: Place 1 spray into both nostrils daily as needed for allergies. ), Disp: 16 g, Rfl: 3 .  ibuprofen (ADVIL,MOTRIN) 600 MG tablet, Take 1 tablet (600 mg total) by mouth every 8 (eight) hours as needed for fever, mild pain or moderate pain., Disp: 30 tablet, Rfl: 0 .  lisinopril-hydrochlorothiazide (ZESTORETIC) 20-12.5 MG tablet, TAKE 1 TABLET BY MOUTH DAILY, Disp: 90 tablet, Rfl: 3 .  Multiple Vitamins-Minerals (CENTRUM SILVER ADULT 50+ PO), Take 1 tablet by mouth daily., Disp: , Rfl:  .  Omega-3 Fatty Acids (FISH OIL PO), Take by mouth., Disp: , Rfl:  .  omeprazole (PRILOSEC) 20 MG capsule, TAKE 1 CAPSULE BY MOUTH ONCE DAILY (Patient taking differently: Take 20 mg by mouth every morning. ), Disp: 90 capsule, Rfl: 3 .  VITAMIN D PO, Take by mouth., Disp: , Rfl:   Review of Systems  Constitutional: Negative.   Respiratory: Negative.   Cardiovascular: Positive for leg swelling.   Social History   Tobacco Use  . Smoking status: Never Smoker  . Smokeless tobacco: Never Used  Substance Use Topics  . Alcohol use: Yes    Alcohol/week: 0.0 standard drinks    Comment: OCCASIONALLY     Objective:   BP 136/70 (BP Location: Right Arm, Patient Position: Sitting, Cuff Size: Normal)   Pulse 64   Temp (!) 97.3 F (36.3 C) (Temporal)   Resp 16   Ht 5\' 4"  (1.626 m)   Wt 196 lb 12.8 oz (89.3 kg)   SpO2 98%   BMI 33.78 kg/m  Vitals:   03/10/19 0937  BP: 136/70  Pulse: 64  Resp: 16  Temp: (!) 97.3 F (36.3 C)  TempSrc: Temporal  SpO2: 98%  Weight: 196 lb 12.8 oz (89.3 kg)  Height: 5\' 4"  (1.626 m)  Body mass index is 33.78 kg/m. Wt Readings from Last 3 Encounters:  03/10/19 196 lb 12.8 oz (89.3 kg)  09/20/18 197 lb 6.4 oz (89.5 kg)  08/19/18 198 lb (89.8 kg)   Physical Exam Constitutional:      General: She is not in acute distress.    Appearance: She is well-developed.  HENT:     Head: Normocephalic and atraumatic.     Right  Ear: Hearing and tympanic membrane normal.     Left Ear: Hearing and tympanic membrane normal.     Ears:     Comments: Wearing hearing aids bilaterally.    Nose: Nose normal.  Eyes:     General: Lids are normal. No scleral icterus.       Right eye: No discharge.        Left eye: No discharge.     Conjunctiva/sclera: Conjunctivae normal.  Neck:     Musculoskeletal: Normal range of motion and neck supple.  Cardiovascular:     Rate and Rhythm: Normal rate and regular rhythm.  Pulmonary:     Effort: Pulmonary effort is normal. No respiratory  distress.  Abdominal:     General: Bowel sounds are normal.     Palpations: Abdomen is soft.  Musculoskeletal: Normal range of motion.  Skin:    Findings: No lesion or rash.  Neurological:     Mental Status: She is alert and oriented to person, place, and time.  Psychiatric:        Speech: Speech normal.        Behavior: Behavior normal.        Thought Content: Thought content normal.       Assessment & Plan    1. Essential (primary) hypertension Well controlled without edema, dyspnea, chest pains, palpitations or dizziness. Tolerating the Zestorectic 20-12.5 mg qd. Recheck CMP and follow up pending reports. - Comprehensive metabolic panel  2. Hypercholesterolemia without hypertriglyceridemia Tolerating Zetia qd and Omega-3 Fish Oil qd without side effects. Will recheck CMP and Lipid Panel. Follow up pending reports. - Comprehensive metabolic panel - Lipid Panel With LDL/HDL Ratio  3. Need for influenza vaccination - Flu Vaccine QUAD 36+ mos IM  4. Screening mammogram, encounter for History of benign lumpectomy by Dr. Hampton Abbot (surgeon) on 07-22-18. Surgeon recommends annual mammograms. - MM Digital Screening     Vernie Murders, PA  [facility]

## 2019-03-11 ENCOUNTER — Telehealth: Payer: Self-pay

## 2019-03-11 LAB — COMPREHENSIVE METABOLIC PANEL
ALT: 30 IU/L (ref 0–32)
AST: 22 IU/L (ref 0–40)
Albumin/Globulin Ratio: 1.8 (ref 1.2–2.2)
Albumin: 4.4 g/dL (ref 3.8–4.8)
Alkaline Phosphatase: 92 IU/L (ref 39–117)
BUN/Creatinine Ratio: 15 (ref 12–28)
BUN: 13 mg/dL (ref 8–27)
Bilirubin Total: 0.5 mg/dL (ref 0.0–1.2)
CO2: 23 mmol/L (ref 20–29)
Calcium: 9.9 mg/dL (ref 8.7–10.3)
Chloride: 104 mmol/L (ref 96–106)
Creatinine, Ser: 0.85 mg/dL (ref 0.57–1.00)
GFR calc Af Amer: 84 mL/min/{1.73_m2} (ref >59)
GFR calc non Af Amer: 73 mL/min/{1.73_m2} (ref >59)
Globulin, Total: 2.5 g/dL (ref 1.5–4.5)
Glucose: 97 mg/dL (ref 65–99)
Potassium: 4.4 mmol/L (ref 3.5–5.2)
Sodium: 142 mmol/L (ref 134–144)
Total Protein: 6.9 g/dL (ref 6.0–8.5)

## 2019-03-11 LAB — LIPID PANEL WITH LDL/HDL RATIO
Cholesterol, Total: 210 mg/dL — ABNORMAL HIGH (ref 100–199)
HDL: 63 mg/dL (ref >39)
LDL Chol Calc (NIH): 121 mg/dL — ABNORMAL HIGH (ref 0–99)
LDL/HDL Ratio: 1.9 ratio (ref 0.0–3.2)
Triglycerides: 150 mg/dL — ABNORMAL HIGH (ref 0–149)
VLDL Cholesterol Cal: 26 mg/dL (ref 5–40)

## 2019-03-11 NOTE — Telephone Encounter (Signed)
LMTCB

## 2019-03-11 NOTE — Telephone Encounter (Signed)
Patient advised as directed below. 

## 2019-03-11 NOTE — Telephone Encounter (Signed)
-----   Message from Margo Common, [place] sent at 03/11/2019  7:37 AM EDT ----- All blood tests normal except LDL cholesterol above the goal of <100. Continue Zetia 10 mg qd with Omega-3 Fish Oil daily. Follow a low fat diet and continue exercise for 30 minutes 3-4 days a week. Recheck progress in 3-4 months.

## 2019-04-28 ENCOUNTER — Other Ambulatory Visit: Payer: Self-pay

## 2019-04-28 ENCOUNTER — Ambulatory Visit (INDEPENDENT_AMBULATORY_CARE_PROVIDER_SITE_OTHER): Payer: 59 | Admitting: Family Medicine

## 2019-04-28 DIAGNOSIS — Z23 Encounter for immunization: Secondary | ICD-10-CM

## 2019-05-27 ENCOUNTER — Other Ambulatory Visit: Payer: Self-pay | Admitting: Family Medicine

## 2019-05-27 DIAGNOSIS — K219 Gastro-esophageal reflux disease without esophagitis: Secondary | ICD-10-CM

## 2019-06-02 ENCOUNTER — Ambulatory Visit
Admission: RE | Admit: 2019-06-02 | Discharge: 2019-06-02 | Disposition: A | Discharge status: Home / Self Care | Payer: 59 | Source: Ambulatory Visit | Attending: Family Medicine | Admitting: Family Medicine

## 2019-06-02 DIAGNOSIS — Z1231 Encounter for screening mammogram for malignant neoplasm of breast: Secondary | ICD-10-CM | POA: Diagnosis not present

## 2019-06-03 ENCOUNTER — Telehealth: Payer: Self-pay

## 2019-06-03 NOTE — Telephone Encounter (Signed)
-----   Message from Margo Common, [place] sent at 06/02/2019  6:06 PM EST ----- Normal mammograms. No sign of malignancy. Recheck test in a year.

## 2019-06-03 NOTE — Telephone Encounter (Signed)
No answer

## 2019-06-10 ENCOUNTER — Other Ambulatory Visit: Payer: Self-pay | Admitting: Family Medicine

## 2019-06-10 DIAGNOSIS — E7800 Pure hypercholesterolemia, unspecified: Secondary | ICD-10-CM

## 2019-06-10 DIAGNOSIS — I10 Essential (primary) hypertension: Secondary | ICD-10-CM

## 2019-08-21 NOTE — Progress Notes (Signed)
Bridget Green  MRN: [id number] DOB: 1955/12/15  Subjective:  HPI    Hypertension, follow-up:  BP Readings from Last 3 Encounters:  08/25/19 (!) 148/72  03/10/19 136/70  09/20/18 138/81    She was last seen for hypertension on 03/10/19 BP at that visit was well controlled. Management since that visit includes no changes. She reports good compliance with treatment. Outside blood pressures are high.    Weight trend: stable Wt Readings from Last 3 Encounters:  08/25/19 195 lb (88.5 kg)  03/10/19 196 lb 12.8 oz (89.3 kg)  09/20/18 197 lb 6.4 oz (89.5 kg)    The patient also complains of a pinched nerve in her right shoulder that radiates down her arm to make it feel like it is heavy.  ------------------------------------------------------------------------  Focus Metrics: Up to date except Fall Risk and Colorectal cancer screening Health Maintenance: Colonoscopy Screenings: Needs; Fall Risk, PHQ9, Function, Audit C, Exercise  Patient Active Problem List   Diagnosis Date Noted  . Breast mass, right   . Esophagitis, reflux 03/23/2015  . Herpes zona 03/23/2015  . Tendon nodule 03/23/2015  . Avitaminosis D 03/23/2015  . Ache in joint 08/04/2014  . Restless leg 11/16/2009  . Disequilibrium 02/22/2009  . Blood in feces 11/09/2008  . Hematochezia 11/09/2008  . Asthma, exogenous 04/10/2008  . Essential (primary) hypertension 01/07/2007  . Breathlessness on exertion 09/11/2006  . Acid reflux 03/30/2006  . Hypercholesterolemia without hypertriglyceridemia 03/30/2006    Past Medical History:  Diagnosis Date  . Arthritis   . Family history of adverse reaction to anesthesia    DAD-HARD TO WAKE UP  . GERD (gastroesophageal reflux disease)   . History of hiatal hernia   . Hyperlipidemia   . Hypertension    Past Surgical History:  Procedure Laterality Date  . ADENOIDECTOMY  1960  . APPENDECTOMY  1983  . BREAST BIOPSY Right 06/20/2018   stereo for distortion   SMALL COMPLEX SCLEROSING LESION  . BREAST EXCISIONAL BIOPSY Right 07/22/2018   NP for  SMALL COMPLEX SCLEROSING LESION  . BREAST LUMPECTOMY WITH NEEDLE LOCALIZATION Right 07/22/2018   Procedure: BREAST LUMPECTOMY WITH NEEDLE LOCALIZATION;  Surgeon: Olean Ree, MD;  Location: [facility] ORS;  Service: General;  Laterality: Right;  . CESAREAN SECTION  [id number]  . CHOLECYSTECTOMY  12/2011  . LASIK Bilateral 2008  . TONSILLECTOMY  1960   Family History  Problem Relation Age of Onset  . Colon cancer Father   . Arthritis Sister   . Hypertension Sister   . Heart attack Maternal Grandfather   . Heart attack Paternal Grandmother   . Thyroid cancer Paternal Grandmother   . Aneurysm Paternal Grandfather   . Arthritis Mother   . Hypertension Mother   . Hyperlipidemia Mother   . Breast cancer Cousin    Social History   Socioeconomic History  . Marital status: Married    Spouse name: Not on file  . Number of children: Not on file  . Years of education: Not on file  . Highest education level: Not on file  Occupational History  . Not on file  Tobacco Use  . Smoking status: Never Smoker  . Smokeless tobacco: Never Used  Substance and Sexual Activity  . Alcohol use: Yes    Alcohol/week: 0.0 standard drinks    Comment: OCCASIONALLY  . Drug use: No  . Sexual activity: Not on file  Other Topics Concern  . Not on file  Social History Narrative  . Not on file  Social Determinants of Health   Financial Resource Strain:   . Difficulty of Paying Living Expenses: Not on file  Food Insecurity:   . Worried About Charity fundraiser in the Last Year: Not on file  . Ran Out of Food in the Last Year: Not on file  Transportation Needs:   . Lack of Transportation (Medical): Not on file  . Lack of Transportation (Non-Medical): Not on file  Physical Activity:   . Days of Exercise per Week: Not on file  . Minutes of Exercise per Session: Not on file  Stress:   . Feeling of Stress : Not on file   Social Connections:   . Frequency of Communication with Friends and Family: Not on file  . Frequency of Social Gatherings with Friends and Family: Not on file  . Attends Religious Services: Not on file  . Active Member of Clubs or Organizations: Not on file  . Attends Archivist Meetings: Not on file  . Marital Status: Not on file  Intimate Partner Violence:   . Fear of Current or Ex-Partner: Not on file  . Emotionally Abused: Not on file  . Physically Abused: Not on file  . Sexually Abused: Not on file    Outpatient Encounter Medications as of 08/25/2019  Medication Sig  . ezetimibe (ZETIA) 10 MG tablet TAKE 1 TABLET BY MOUTH DAILY  . fluticasone (FLONASE) 50 MCG/ACT nasal spray PLACE 2 SPRAYS INTO BOTH NOSTRILS AT BEDTIME (Patient taking differently: Place 1 spray into both nostrils daily as needed for allergies. )  . ibuprofen (ADVIL,MOTRIN) 600 MG tablet Take 1 tablet (600 mg total) by mouth every 8 (eight) hours as needed for fever, mild pain or moderate pain.  Marland Kitchen lisinopril-hydrochlorothiazide (ZESTORETIC) 20-12.5 MG tablet TAKE 1 TABLET BY MOUTH DAILY  . Multiple Vitamins-Minerals (CENTRUM SILVER ADULT 50+ PO) Take 1 tablet by mouth daily.  . Omega-3 Fatty Acids (FISH OIL PO) Take by mouth.  Marland Kitchen omeprazole (PRILOSEC) 20 MG capsule TAKE 1 CAPSULE BY MOUTH EVERY DAY  . VITAMIN D PO Take by mouth.   No facility-administered encounter medications on file as of 08/25/2019.    Allergies  Allergen Reactions  . Penicillins Itching and Other (See Comments)    DID THE REACTION INVOLVE: Swelling of the face/tongue/throat, SOB, or low BP? No Sudden or severe rash/hives, skin peeling, or the inside of the mouth or nose? No Did it require medical treatment? No When did it last happen? Unknown age If all above answers are "NO", may proceed with cephalosporin use.   . Sulfa Antibiotics Itching    Review of Systems  Constitutional: Negative for fever and malaise/fatigue.  HENT:  Negative for congestion and sore throat.   Respiratory: Negative for cough and shortness of breath.   Cardiovascular: Negative for chest pain, palpitations, claudication and leg swelling.  Gastrointestinal: Negative for abdominal pain and diarrhea.  Musculoskeletal: Positive for joint pain. Negative for myalgias.  Neurological: Negative for dizziness and headaches.    Objective:  BP (!) 148/72 (BP Location: Right Arm, Patient Position: Sitting, Cuff Size: Normal)   Pulse 80   Temp (!) 96.9 F (36.1 C) (Skin)   Wt 195 lb (88.5 kg)   SpO2 98%   BMI 33.47 kg/m   Physical Exam  Constitutional: She is oriented to person, place, and time and well-developed, well-nourished, and in no distress.  HENT:  Head: Normocephalic.  Eyes: Conjunctivae are normal.  Cardiovascular: Normal rate and regular rhythm.  Pulmonary/Chest: Effort  normal and breath sounds normal.  Abdominal: Soft. Bowel sounds are normal.  Musculoskeletal:        General: Normal range of motion.     Cervical back: Neck supple.  Neurological: She is alert and oriented to person, place, and time.  Skin: No rash noted.  Psychiatric: Mood, affect and judgment normal.    Assessment and Plan :  1. Essential (primary) hypertension Some elevation of systolic pressure. Still taking the Zestoretic 20-12.5 mg qd. Encouraged to reduce salt intake and reduce caffeine intake. Recheck labs and continue to monitor BP. Follow up pending reports. - CBC with Differential/Platelet - Comprehensive metabolic panel - Lipid panel  2. Strain of right shoulder, initial encounter Onset in January 2021. No specific injury known. Pain behind the right scapula that has slowly improved with use of stretching, heating pad and Arthritis Tylenol. No neurovascular deficits today. May use NSAID of choice and continue rehab exercises.  3. Hypercholesterolemia without hypertriglyceridemia Tolerating Zetia 10 mg qd with Omega-3 qd. Continue low fat diet  and work on weight loss. Recheck labs. - Comprehensive metabolic panel - Lipid panel  4. Body mass index (BMI) of 33.0-33.9 in adult Has lost 2 lbs in the past year. A little more active taking care of grandchildren. Reduce caloric intake and restrict salt. Consider adding a 30 minute walk 3-4 days a week for more calorie burn. Recheck in 6 months.  5. Need for zoster vaccination - Varicella-zoster vaccine IM (Shingrix)

## 2019-08-25 ENCOUNTER — Ambulatory Visit (INDEPENDENT_AMBULATORY_CARE_PROVIDER_SITE_OTHER): Payer: 59 | Admitting: Family Medicine

## 2019-08-25 ENCOUNTER — Encounter: Payer: Self-pay | Admitting: Family Medicine

## 2019-08-25 ENCOUNTER — Other Ambulatory Visit: Payer: Self-pay

## 2019-08-25 VITALS — BP 148/72 | HR 80 | Temp 96.9°F | Wt 195.0 lb

## 2019-08-25 DIAGNOSIS — E7800 Pure hypercholesterolemia, unspecified: Secondary | ICD-10-CM

## 2019-08-25 DIAGNOSIS — S46912A Strain of unspecified muscle, fascia and tendon at shoulder and upper arm level, left arm, initial encounter: Secondary | ICD-10-CM | POA: Diagnosis not present

## 2019-08-25 DIAGNOSIS — Z23 Encounter for immunization: Secondary | ICD-10-CM | POA: Diagnosis not present

## 2019-08-25 DIAGNOSIS — I10 Essential (primary) hypertension: Secondary | ICD-10-CM | POA: Diagnosis not present

## 2019-08-25 DIAGNOSIS — Z6833 Body mass index (BMI) 33.0-33.9, adult: Secondary | ICD-10-CM | POA: Diagnosis not present

## 2019-08-25 NOTE — Patient Instructions (Signed)

## 2019-08-26 ENCOUNTER — Telehealth: Payer: Self-pay

## 2019-08-26 LAB — COMPREHENSIVE METABOLIC PANEL
ALT: 33 IU/L — ABNORMAL HIGH (ref 0–32)
AST: 22 IU/L (ref 0–40)
Albumin/Globulin Ratio: 1.7 (ref 1.2–2.2)
Albumin: 4.4 g/dL (ref 3.8–4.8)
Alkaline Phosphatase: 106 IU/L (ref 39–117)
BUN/Creatinine Ratio: 15 (ref 12–28)
BUN: 12 mg/dL (ref 8–27)
Bilirubin Total: 0.4 mg/dL (ref 0.0–1.2)
CO2: 22 mmol/L (ref 20–29)
Calcium: 9.7 mg/dL (ref 8.7–10.3)
Chloride: 105 mmol/L (ref 96–106)
Creatinine, Ser: 0.79 mg/dL (ref 0.57–1.00)
GFR calc Af Amer: 91 mL/min/{1.73_m2} (ref >59)
GFR calc non Af Amer: 79 mL/min/{1.73_m2} (ref >59)
Globulin, Total: 2.6 g/dL (ref 1.5–4.5)
Glucose: 106 mg/dL — ABNORMAL HIGH (ref 65–99)
Potassium: 4.4 mmol/L (ref 3.5–5.2)
Sodium: 143 mmol/L (ref 134–144)
Total Protein: 7 g/dL (ref 6.0–8.5)

## 2019-08-26 LAB — CBC WITH DIFFERENTIAL/PLATELET
Basophils Absolute: 0 10*3/uL (ref 0.0–0.2)
Basos: 0 %
EOS (ABSOLUTE): 0 10*3/uL (ref 0.0–0.4)
Eos: 1 %
Hematocrit: 46.8 % — ABNORMAL HIGH (ref 34.0–46.6)
Hemoglobin: 15.5 g/dL (ref 11.1–15.9)
Immature Grans (Abs): 0 10*3/uL (ref 0.0–0.1)
Immature Granulocytes: 0 %
Lymphocytes Absolute: 2.6 10*3/uL (ref 0.7–3.1)
Lymphs: 41 %
MCH: 30.5 pg (ref 26.6–33.0)
MCHC: 33.1 g/dL (ref 31.5–35.7)
MCV: 92 fL (ref 79–97)
Monocytes Absolute: 0.4 10*3/uL (ref 0.1–0.9)
Monocytes: 6 %
Neutrophils Absolute: 3.4 10*3/uL (ref 1.4–7.0)
Neutrophils: 52 %
Platelets: 264 10*3/uL (ref 150–450)
RBC: 5.08 x10E6/uL (ref 3.77–5.28)
RDW: 12.6 % (ref 11.7–15.4)
WBC: 6.5 10*3/uL (ref 3.4–10.8)

## 2019-08-26 LAB — LIPID PANEL
Chol/HDL Ratio: 3 ratio (ref 0.0–4.4)
Cholesterol, Total: 211 mg/dL — ABNORMAL HIGH (ref 100–199)
HDL: 71 mg/dL (ref >39)
LDL Chol Calc (NIH): 117 mg/dL — ABNORMAL HIGH (ref 0–99)
Triglycerides: 134 mg/dL (ref 0–149)
VLDL Cholesterol Cal: 23 mg/dL (ref 5–40)

## 2019-08-26 NOTE — Telephone Encounter (Signed)
Pt. Called back, given lab results and instructions. Verbalizes understanding.

## 2019-08-26 NOTE — Telephone Encounter (Signed)
LMTCB, ok for tirage to give results

## 2019-08-26 NOTE — Telephone Encounter (Signed)
-----   Message from Margo Common, [place] sent at 08/26/2019  9:13 AM EST ----- Blood tests are essentially normal with minimal variations. Cholesterol levels are improving with the use of Omega-3 Fish Oil and Zetia. Continue to watch fats in diet and continue present medications. Recheck in 6 months.

## 2019-10-29 ENCOUNTER — Other Ambulatory Visit: Payer: Self-pay | Admitting: Family Medicine

## 2019-10-29 DIAGNOSIS — J309 Allergic rhinitis, unspecified: Secondary | ICD-10-CM

## 2019-10-29 NOTE — Telephone Encounter (Signed)
Requested medication (s) are due for refill today: yes  Requested medication (s) are on the active medication list: yes  Last refill:  06/30/2019  Future visit scheduled: yes  Notes to clinic:  script has expired please review for refill   Requested Prescriptions  Pending Prescriptions Disp Refills   fluticasone (FLONASE) 50 MCG/ACT nasal spray [Pharmacy Med Name: FLUTICASONE PROPIONATE 50 MCG/ACT N]      Sig: TAKE 2 SPRAYS INTO BOTH NOSTRIL AT BEDTIME      Ear, Nose, and Throat: Nasal Preparations - Corticosteroids Passed - 10/29/2019  3:17 PM      Passed - Valid encounter within last 12 months    Recent Outpatient Visits           2 months ago Essential (primary) hypertension   [facility], Vickki Muff, [place]   7 months ago Essential (primary) hypertension   [facility], Vickki Muff, [place]   1 year ago Annual physical exam   [place], [place]   1 year ago Rib pain on left side   [facility], Vickki Muff, [place]   1 year ago Other infective acute otitis externa of left ear   [facility] [place], [place], [place]

## 2019-11-10 ENCOUNTER — Telehealth: Payer: Self-pay

## 2019-11-10 NOTE — Telephone Encounter (Signed)
This is

## 2019-11-10 NOTE — Telephone Encounter (Signed)
Copied from [place] [phone]. Topic: Quick Communication - See Telephone Encounter >> Nov 10, 2019  8:37 AM Blase Mess A wrote: CRM for notification. See Telephone encounter for: 11/10/19.  Patient called to schedule CPE- DT prompted a welcome to Medicare visit. Can scheduler see if this is correct please. Please advise

## 2019-11-26 NOTE — Progress Notes (Signed)
Annual Wellness Visit  I,Bridget Green,acting as a Education administrator for [employer], [employer].,have documented all relevant documentation on the behalf of Bridget Murders, PA,as directed by  [employer], [employer] while in the presence of [employer], [place].     [name], Female    DOB: 1956-06-25, 64 y.o.   MRN: [id number] Visit Date: 11/28/2019  Today's Provider: Vernie Murders, PA   Chief Complaint  Patient presents with  . Medicare Wellness   Subjective    Bridget Green is a 64 y.o. female who presents today for her Annual Wellness Visit. She reports consuming a general diet. The patient does not participate in regular exercise at present. She generally feels well. She reports sleeping well. She does not have additional problems to discuss today.    Past Medical History:  Diagnosis Date  . Arthritis   . Family history of adverse reaction to anesthesia    DAD-HARD TO WAKE UP  . GERD (gastroesophageal reflux disease)   . History of hiatal hernia   . Hyperlipidemia   . Hypertension    Past Surgical History:  Procedure Laterality Date  . ADENOIDECTOMY  1960  . APPENDECTOMY  1983  . BREAST BIOPSY Right 06/20/2018   stereo for distortion  SMALL COMPLEX SCLEROSING LESION  . BREAST EXCISIONAL BIOPSY Right 07/22/2018   NP for  SMALL COMPLEX SCLEROSING LESION  . BREAST LUMPECTOMY WITH NEEDLE LOCALIZATION Right 07/22/2018   Procedure: BREAST LUMPECTOMY WITH NEEDLE LOCALIZATION;  Surgeon: Olean Ree, MD;  Location: [facility] ORS;  Service: General;  Laterality: Right;  . CESAREAN SECTION  [id number]  . CHOLECYSTECTOMY  12/2011  . LASIK Bilateral 2008  . TONSILLECTOMY  1960   Social History   Socioeconomic History  . Marital status: Married    Spouse name: Not on file  . Number of children: Not on file  . Years of education: Not on file  . Highest education level: Not on file  Occupational History  . Not on file  Tobacco Use  . Smoking status: Never Smoker  .  Smokeless tobacco: Never Used  Substance and Sexual Activity  . Alcohol use: Yes    Alcohol/week: 0.0 standard drinks    Comment: OCCASIONALLY  . Drug use: No  . Sexual activity: Not on file  Other Topics Concern  . Not on file  Social History Narrative  . Not on file   Social Determinants of Health   Financial Resource Strain:   . Difficulty of Paying Living Expenses:   Food Insecurity:   . Worried About Charity fundraiser in the Last Year:   . Arboriculturist in the Last Year:   Transportation Needs:   . Film/video editor (Medical):   Marland Kitchen Lack of Transportation (Non-Medical):   Physical Activity:   . Days of Exercise per Week:   . Minutes of Exercise per Session:   Stress:   . Feeling of Stress :   Social Connections:   . Frequency of Communication with Friends and Family:   . Frequency of Social Gatherings with Friends and Family:   . Attends Religious Services:   . Active Member of Clubs or Organizations:   . Attends Archivist Meetings:   Marland Kitchen Marital Status:   Intimate Partner Violence:   . Fear of Current or Ex-Partner:   . Emotionally Abused:   Marland Kitchen Physically Abused:   . Sexually Abused:    Family History  Problem Relation Age of Onset  .  Colon cancer Father   . Arthritis Sister   . Hypertension Sister   . Heart attack Maternal Grandfather   . Heart attack Paternal Grandmother   . Thyroid cancer Paternal Grandmother   . Aneurysm Paternal Grandfather   . Arthritis Mother   . Hypertension Mother   . Hyperlipidemia Mother   . Breast cancer Cousin    Allergies  Allergen Reactions  . Penicillins Itching and Other (See Comments)    DID THE REACTION INVOLVE: Swelling of the face/tongue/throat, SOB, or low BP? No Sudden or severe rash/hives, skin peeling, or the inside of the mouth or nose? No Did it require medical treatment? No When did it last happen? Unknown age If all above answers are "NO", may proceed with cephalosporin use.   . Sulfa  Antibiotics Itching      Medications: Outpatient Medications Prior to Visit  Medication Sig  . ezetimibe (ZETIA) 10 MG tablet TAKE 1 TABLET BY MOUTH DAILY  . fluticasone (FLONASE) 50 MCG/ACT nasal spray TAKE 2 SPRAYS INTO BOTH NOSTRIL AT BEDTIME  . ibuprofen (ADVIL,MOTRIN) 600 MG tablet Take 1 tablet (600 mg total) by mouth every 8 (eight) hours as needed for fever, mild pain or moderate pain.  Marland Kitchen lisinopril-hydrochlorothiazide (ZESTORETIC) 20-12.5 MG tablet TAKE 1 TABLET BY MOUTH DAILY  . Multiple Vitamins-Minerals (CENTRUM SILVER ADULT 50+ PO) Take 1 tablet by mouth daily.  . Omega-3 Fatty Acids (FISH OIL PO) Take by mouth.  Marland Kitchen omeprazole (PRILOSEC) 20 MG capsule TAKE 1 CAPSULE BY MOUTH EVERY DAY  . VITAMIN D PO Take by mouth.   No facility-administered medications prior to visit.    Allergies  Allergen Reactions  . Penicillins Itching and Other (See Comments)    DID THE REACTION INVOLVE: Swelling of the face/tongue/throat, SOB, or low BP? No Sudden or severe rash/hives, skin peeling, or the inside of the mouth or nose? No Did it require medical treatment? No When did it last happen? Unknown age If all above answers are "NO", may proceed with cephalosporin use.   [name] Antibiotics Itching    Patient Care Team: Annasofia Pohl, Vickki Muff, PA as PCP - General (Physician Assistant)  Review of Systems  All other systems reviewed and are negative.     Objective    Vitals: BP 128/78 (BP Location: Right Arm, Patient Position: Sitting, Cuff Size: Large)   Pulse 68   Temp (!) 97.3 F (36.3 C) (Other (Comment))   Resp 18   Ht 5\' 4"  (1.626 m)   Wt 194 lb (88 kg)   SpO2 97%   BMI 33.30 kg/m    Wt Readings from Last 3 Encounters:  11/28/19 194 lb (88 kg)  08/25/19 195 lb (88.5 kg)  03/10/19 196 lb 12.8 oz (89.3 kg)    Physical Exam Vitals and nursing note reviewed. Exam conducted with a chaperone present.  Constitutional:      Appearance: Normal appearance.  HENT:     Head:  Normocephalic.     Jaw: There is normal jaw occlusion.     Ears:     Comments: Bilateral hearing aids in place    Nose: Nose normal.     Mouth/Throat:     Mouth: Mucous membranes are moist.     Pharynx: Oropharynx is clear.  Eyes:     General: No scleral icterus.    Conjunctiva/sclera: Conjunctivae normal.  Neck:     Vascular: No carotid bruit.  Cardiovascular:     Rate and Rhythm: Normal rate and regular  rhythm.     Pulses: Normal pulses.     Heart sounds: Normal heart sounds. No murmur.  Pulmonary:     Effort: Pulmonary effort is normal.     Breath sounds: Normal breath sounds.  Chest:     Breasts:        Right: Normal.        Left: Normal.  Abdominal:     General: Abdomen is flat. Bowel sounds are normal.     Palpations: Abdomen is soft.  Musculoskeletal:        General: Normal range of motion.     Cervical back: Full passive range of motion without pain, normal range of motion and neck supple.  Skin:    General: Skin is warm and dry.  Neurological:     General: No focal deficit present.     Mental Status: She is alert.     Sensory: Sensation is intact.     Motor: Motor function is intact.     Coordination: Coordination is intact.     Gait: Gait is intact.  Psychiatric:        Attention and Perception: Attention and perception normal.        Mood and Affect: Mood normal.        Speech: Speech normal.        Behavior: Behavior normal. Behavior is cooperative.        Thought Content: Thought content normal.        Cognition and Memory: Cognition and memory normal.        Judgment: Judgment normal.     Most recent functional status assessment: In your present state of health, do you have any difficulty performing the following activities: 11/28/2019  Hearing? Y  Vision? N  Difficulty concentrating or making decisions? N  Walking or climbing stairs? N  Dressing or bathing? N  Doing errands, shopping? N  Some recent data might be hidden    Most recent fall  risk assessment: Fall Risk  11/28/2019  Falls in the past year? 0  Number falls in past yr: 0  Injury with Fall? 0  Follow up Falls evaluation completed     Most recent depression screenings: PHQ 2/9 Scores 11/28/2019 08/19/2018  PHQ - 2 Score 0 0  PHQ- 9 Score 2 1    Hearing Screening   125Hz  250Hz  500Hz  1000Hz  2000Hz  3000Hz  4000Hz  6000Hz  8000Hz   Right ear:           Left ear:             Visual Acuity Screening   Right eye Left eye Both eyes  Without correction: 20/20 20/25 20/20   With correction:       Most recent cognitive screening: No flowsheet data found.  No results found for any visits on 11/28/19.  Assessment & Plan     Annual wellness visit done today including the all of the following: Reviewed patient's Family Medical History Reviewed and updated list of patient's medical providers Assessment of cognitive impairment was done Assessed patient's functional ability Established a written schedule for health screening [place] Completed and Reviewed  Exercise Activities and Dietary recommendations Goals   No regular exercise program. Very active keeping grandchildren. Encouraged to exercise 30-40 minutes 3-4 days a week and drink 4-6 glasses of water a day.     Immunization History  Administered Date(s) Administered  . Influenza,inj,Quad PF,6+ Mos 04/24/2017, 03/10/2019  . Influenza-Unspecified 05/10/2017, 04/09/2018  . Pneumococcal Conjugate-13 04/28/2019  .  Tdap 03/26/2015  . Zoster 11/27/2014  . Zoster Recombinat (Shingrix) 04/28/2019, 08/25/2019    Health Maintenance  Topic Date Due  . COVID-19 Vaccine (1) Never done  . COLONOSCOPY  08/28/2018  . INFLUENZA VACCINE  02/08/2020  . PNA vac Low Risk Adult (2 of 2 - PPSV23) 04/27/2020  . MAMMOGRAM  06/01/2021  . PAP SMEAR-Modifier  08/19/2021  . TETANUS/TDAP  03/25/2025  . DEXA SCAN  Completed  . Hepatitis C Screening  Completed  . HIV Screening  Completed     Discussed  health benefits of physical activity, and encouraged her to engage in regular exercise appropriate for her age and condition.    1. Welcome to Medicare preventive visit General health good. EKG normal. Still using hearing aid for significant hearing loss. Given anticipatory counseling. Immunizations up to date. - EKG 12-Lead  2. Essential (primary) hypertension BP well controlled on the Zestoretic 20-12.5 mg qd. Labs on 08-25-19 showed normal electrolytes and renal function. CMP     Component Value Date/Time   NA 143 08/25/2019 0929   NA 141 12/07/2011 1056   K 4.4 08/25/2019 0929   K 4.1 12/07/2011 1056   CL 105 08/25/2019 0929   CL 107 12/07/2011 1056   CO2 22 08/25/2019 0929   CO2 29 12/07/2011 1056   GLUCOSE 106 (H) 08/25/2019 0929   GLUCOSE 102 (H) 07/22/2018 0930   GLUCOSE 94 12/07/2011 1056   BUN 12 08/25/2019 0929   BUN 11 12/07/2011 1056   CREATININE 0.79 08/25/2019 0929   CREATININE 0.66 12/07/2011 1056   CALCIUM 9.7 08/25/2019 0929   CALCIUM 9.1 12/07/2011 1056   PROT 7.0 08/25/2019 0929   ALBUMIN 4.4 08/25/2019 0929   AST 22 08/25/2019 0929   ALT 33 (H) 08/25/2019 0929   ALKPHOS 106 08/25/2019 0929   BILITOT 0.4 08/25/2019 0929   GFRNONAA 79 08/25/2019 0929   GFRNONAA >60 12/07/2011 1056   GFRAA 91 08/25/2019 0929   GFRAA >60 12/07/2011 1056     3. Hypercholesterolemia without hypertriglyceridemia Labs in February 2021 showed improvement in lipids while on the Zetia, Omega-3 Fish Oil and low fat diet. Will recheck progress in 4-5 months   Return in 5 months (on 04/29/2020).     I, Bridget Green, CMA, have reviewed all documentation for this visit. The documentation on 11/28/19 for the exam, diagnosis, procedures, and orders are all accurate and complete.  Andres Shad, PA, have reviewed all documentation for this visit. The documentation on 12/12/19 for the exam, diagnosis, procedures, and orders are all accurate and complete.   Bridget Green, [place] [phone] (phone) [phone] (fax)  [facility]

## 2019-11-28 ENCOUNTER — Other Ambulatory Visit: Payer: Self-pay

## 2019-11-28 ENCOUNTER — Encounter: Payer: Self-pay | Admitting: Family Medicine

## 2019-11-28 ENCOUNTER — Ambulatory Visit (INDEPENDENT_AMBULATORY_CARE_PROVIDER_SITE_OTHER): Payer: 59 | Admitting: Family Medicine

## 2019-11-28 VITALS — BP 128/78 | HR 68 | Temp 97.3°F | Resp 18 | Ht 64.0 in | Wt 194.0 lb

## 2019-11-28 DIAGNOSIS — I10 Essential (primary) hypertension: Secondary | ICD-10-CM

## 2019-11-28 DIAGNOSIS — E7800 Pure hypercholesterolemia, unspecified: Secondary | ICD-10-CM

## 2019-11-28 DIAGNOSIS — Z0000 Encounter for general adult medical examination without abnormal findings: Secondary | ICD-10-CM

## 2020-01-21 IMAGING — MG BREAST SURGICAL SPECIMEN
1 series · 1 of 1 positions shown · non-contrast
Comparison: Previous exam(s).

CLINICAL DATA: Evaluate surgical specimen following excision of
RIGHT breast complex sclerosing lesion.

EXAM:
SPECIMEN RADIOGRAPH OF THE RIGHT BREAST

[R SPECIMEN]
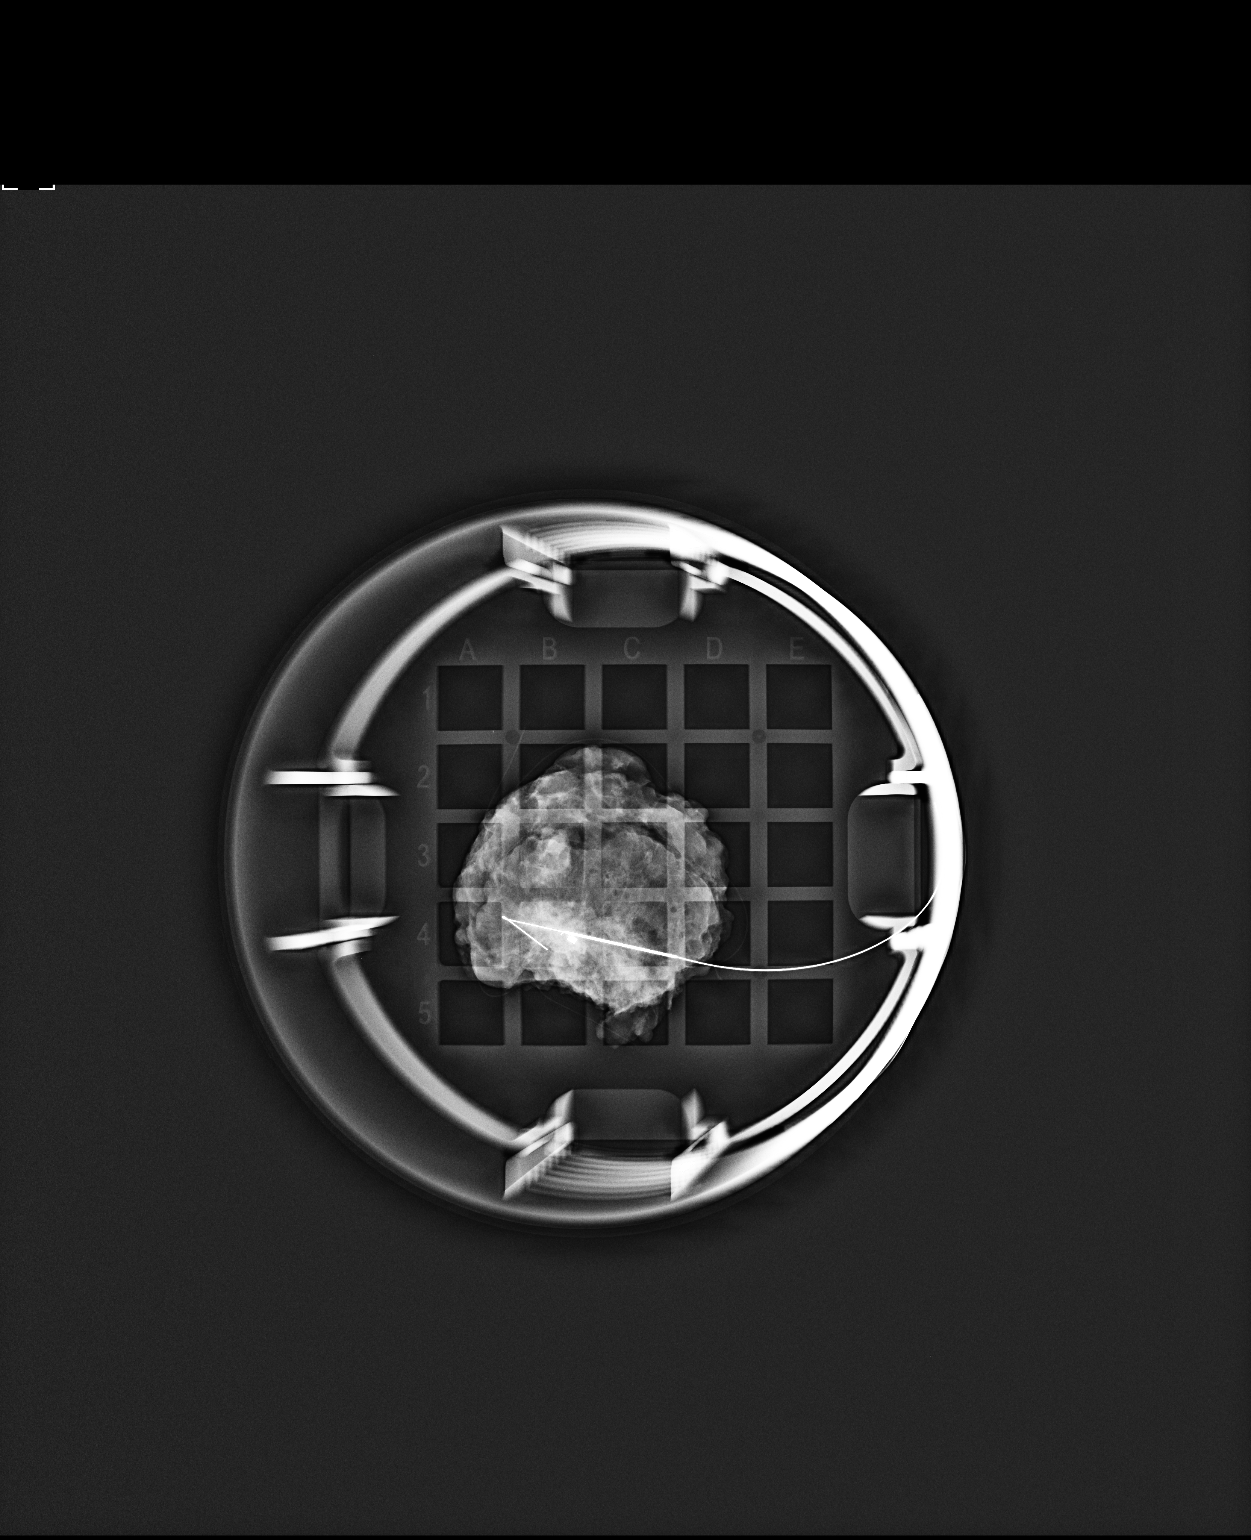

[1 of 1 positions shown; findings below may reference images not displayed]

FINDINGS: Status post excision of the RIGHT breast. The wire tip and biopsy
marker clip are present and are marked for pathology.
IMPRESSION: Specimen radiograph of the RIGHT breast.

## 2020-01-21 IMAGING — MG MM PLC BREAST LOC DEV 1ST LESION INC*R*
6 series · 6 of 6 positions shown · non-contrast
Comparison: Previous exams.

CLINICAL DATA: 62-year-old female for localization of RIGHT breast
complex sclerosing lesion.

EXAM:
NEEDLE LOCALIZATION OF THE RIGHT BREAST WITH MAMMO GUIDANCE

[R LM (1 of 2)]
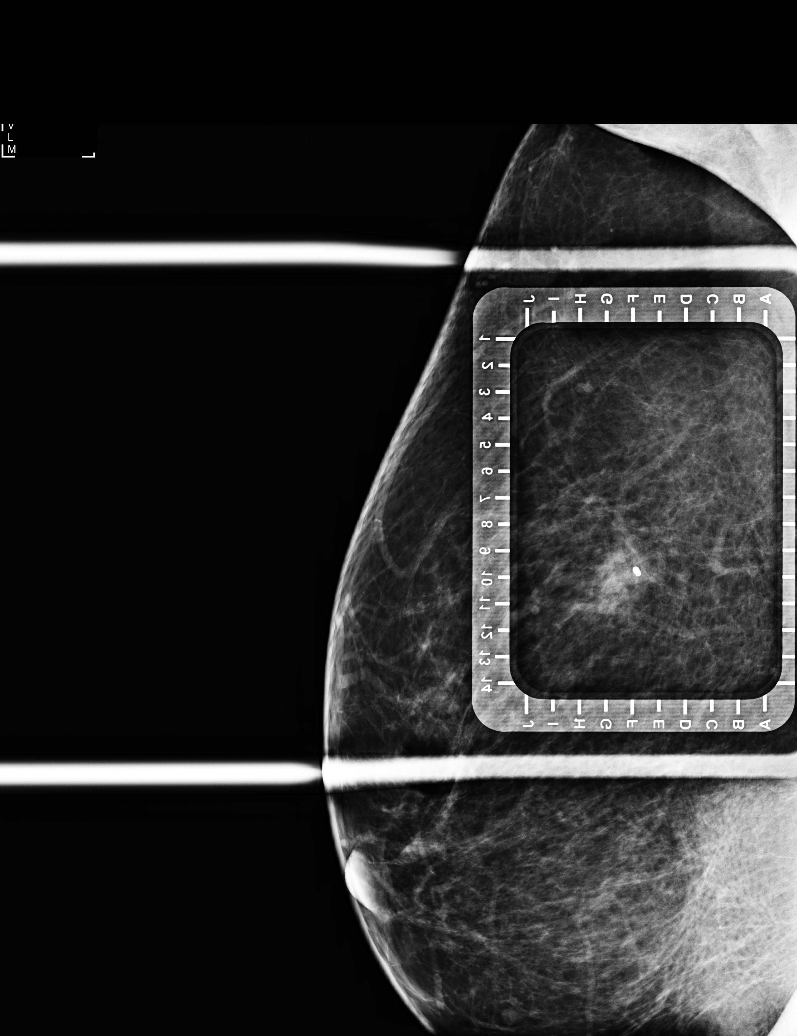

[R LM (2 of 2)]
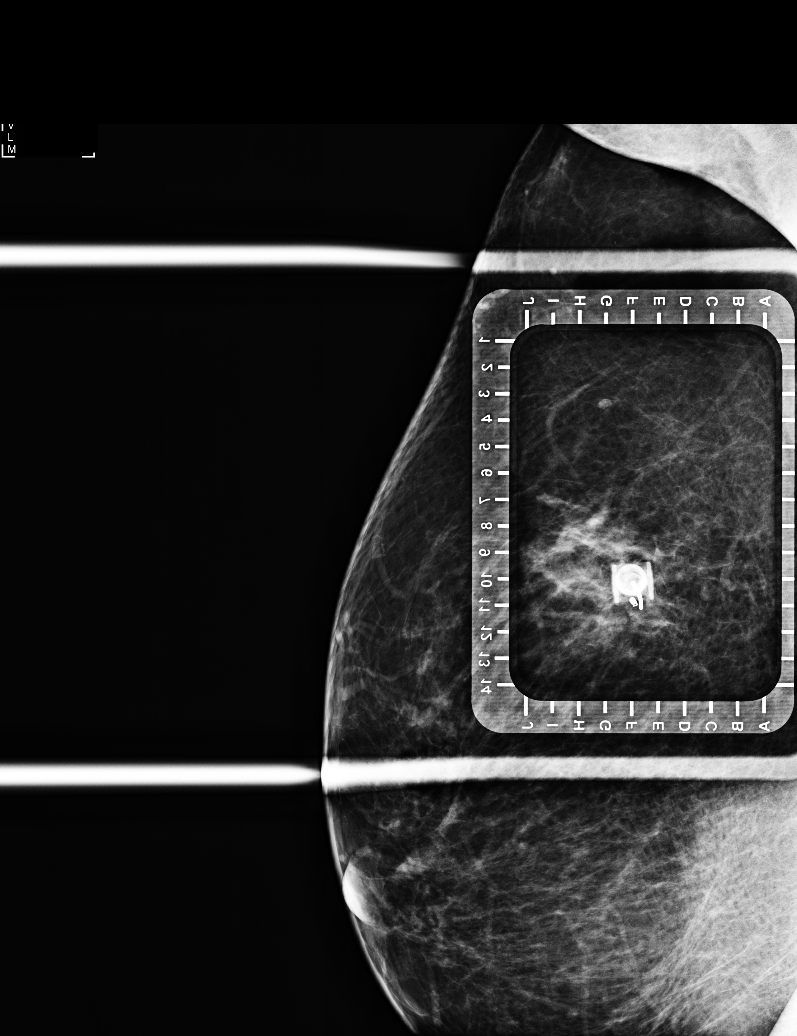

[R CC (1 of 4)]
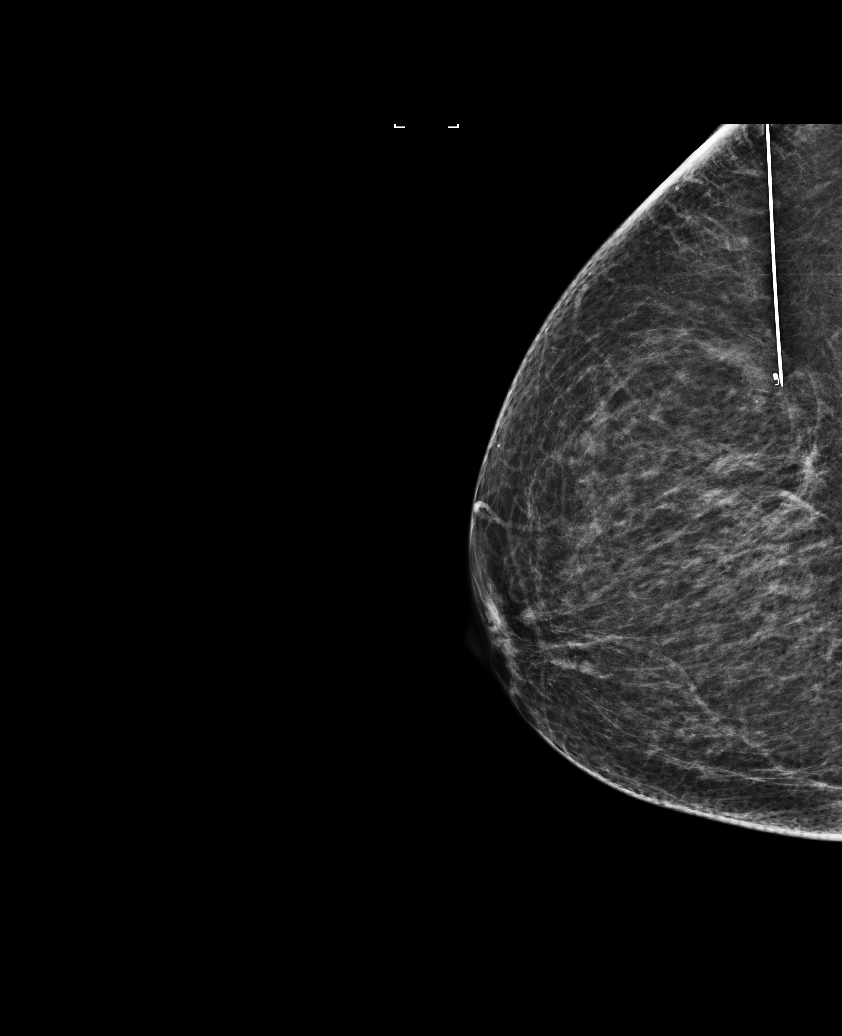

[R CC (2 of 4)]
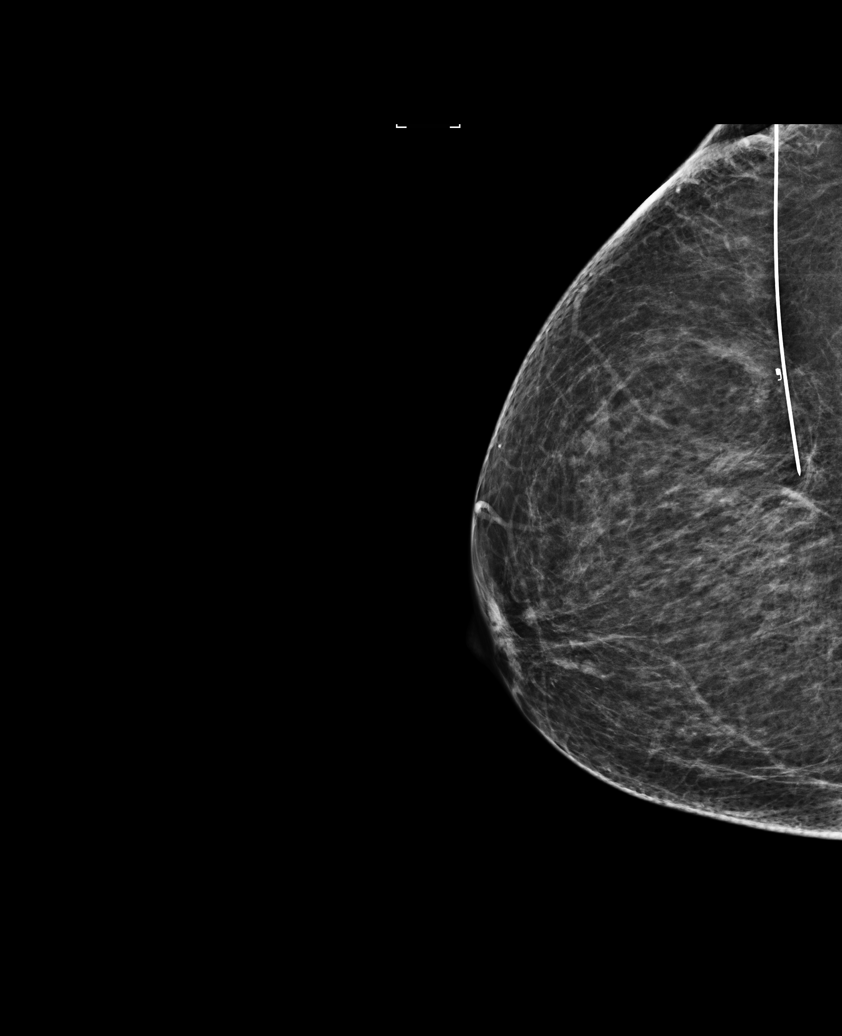

[R CC (3 of 4)]
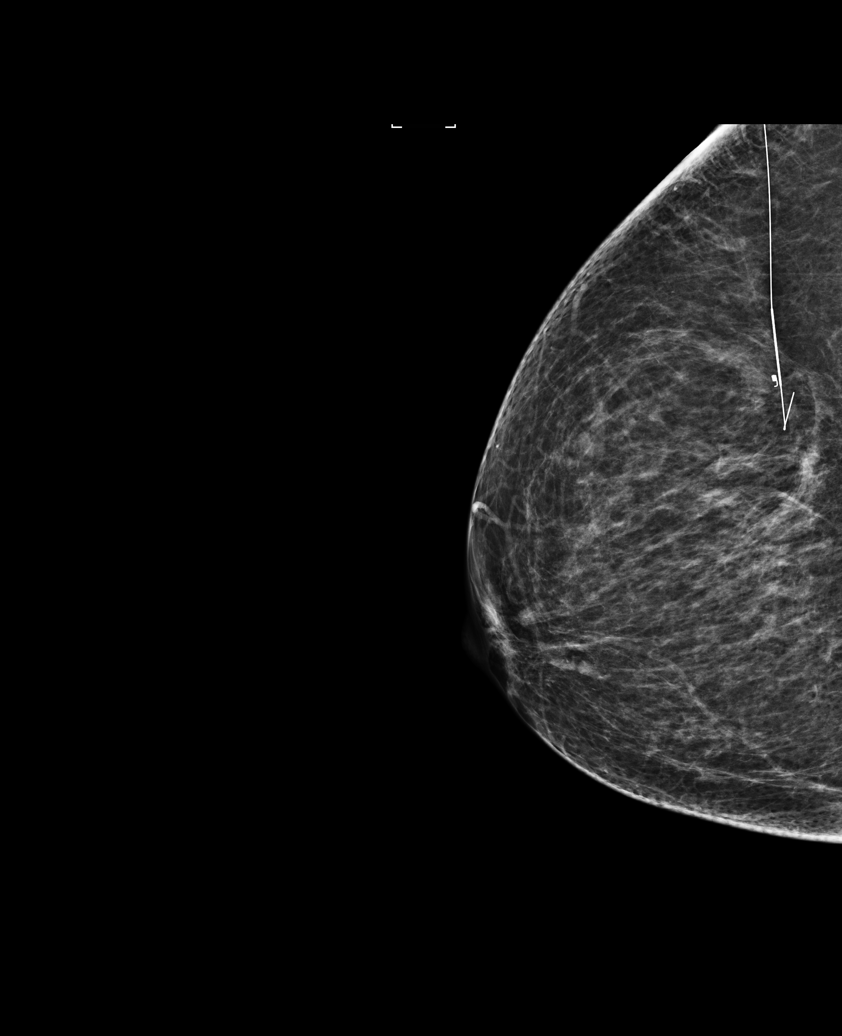

[R CC (4 of 4)]
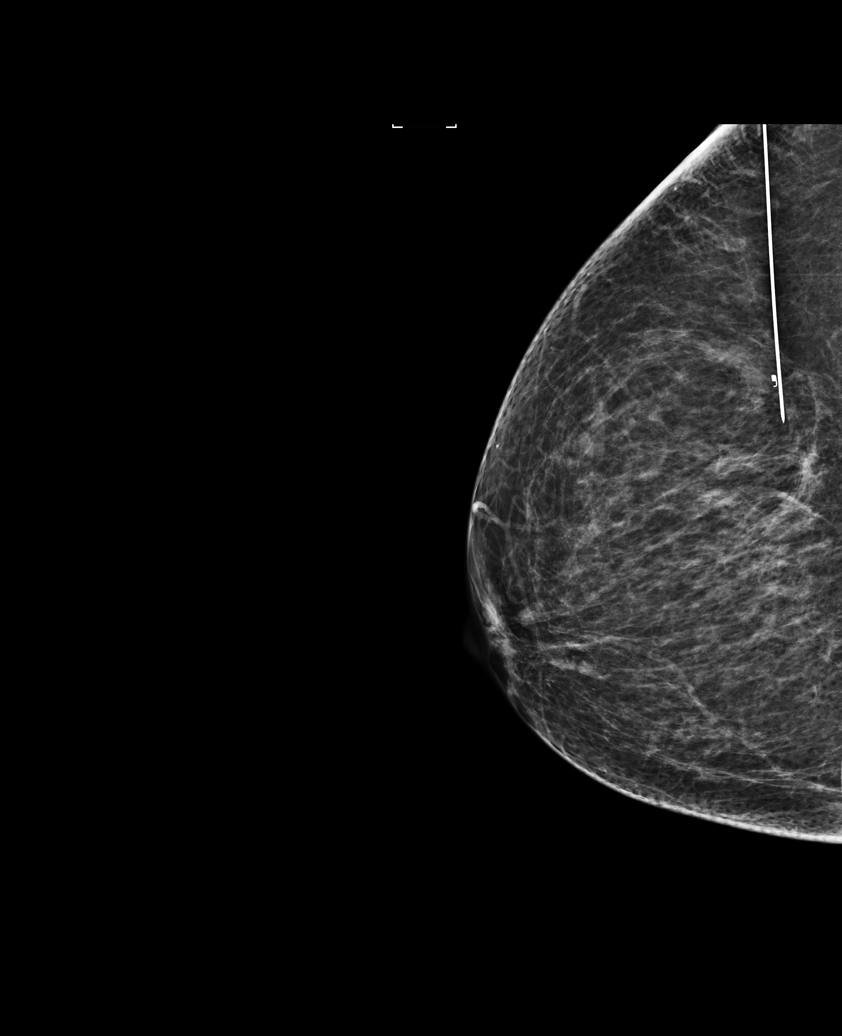

[6 of 6 positions shown; findings below may reference images not displayed]

FINDINGS: Patient presents for needle localization prior to RIGHT breast
excision. I met with the patient and we discussed the procedure of
needle localization including benefits and alternatives. We
discussed the high likelihood of a successful procedure. We
discussed the risks of the procedure, including infection, bleeding,
tissue injury, and further surgery. Informed, written consent was
given. The usual time-out protocol was performed immediately prior
to the procedure.

Using mammographic guidance, sterile technique, 1% lidocaine and a 9
cm modified Kopans needle, the COIL clip was localized using LATERAL
approach. The images were marked for Dr. Ferienhaus.
IMPRESSION: Needle localization RIGHT breast. No apparent complications.

## 2020-04-28 NOTE — Progress Notes (Deleted)
Established patient visit   [name]   DOB: 07/28/1955   64 y.o. Female  MRN: [id number] Visit Date: 04/29/2020  Today's healthcare provider: Vernie Murders, PA   Chief Complaint  Patient presents with  . Hyperlipidemia  . Hypertension   Subjective    HPI  Hypertension, follow-up  BP Readings from Last 3 Encounters:  11/28/19 128/78  08/25/19 (!) 148/72  03/10/19 136/70   Wt Readings from Last 3 Encounters:  11/28/19 194 lb (88 kg)  08/25/19 195 lb (88.5 kg)  03/10/19 196 lb 12.8 oz (89.3 kg)     She was last seen for hypertension 5 months ago.  BP at that visit was 128/78. Management since that visit includes no changes. She reports good compliance with treatment. She is not having side effects.  She {is/is not:9024} exercising. She {is/is not:9024} adherent to low salt diet.   Outside blood pressures are {enter patient reported home BP, or 'not being checked':1}.  She {does/does not:200015} smoke.  Use of agents associated with hypertension: {bp agents assoc with hypertension:511::"none"}.   --------------------------------------------------------------------------------------------------- Lipid/Cholesterol, follow-up  Last Lipid Panel: Lab Results  Component Value Date   CHOL 211 (H) 08/25/2019   LDLCALC 117 (H) 08/25/2019   HDL 71 08/25/2019   TRIG 134 08/25/2019    She was last seen for this 5 months ago.  Management since that visit includes no change.  She reports good compliance with treatment. She is not having side effects.   Symptoms: No appetite changes No foot ulcerations  No chest pain No chest pressure/discomfort  No dyspnea No orthopnea  No fatigue No lower extremity edema  No palpitations No paroxysmal nocturnal dyspnea  No nausea No numbness or tingling of extremity  No polydipsia No polyuria  No speech difficulty No syncope   She is following a {diet:21022986} diet. Current exercise: {exercise  DXIPJ:82505}  Last metabolic panel Lab Results  Component Value Date   GLUCOSE 106 (H) 08/25/2019   NA 143 08/25/2019   K 4.4 08/25/2019   BUN 12 08/25/2019   CREATININE 0.79 08/25/2019   GFRNONAA 79 08/25/2019   GFRAA 91 08/25/2019   CALCIUM 9.7 08/25/2019   AST 22 08/25/2019   ALT 33 (H) 08/25/2019   The 10-year ASCVD risk score Mikey Bussing DC Jr., et al., 2013) is: 7.7%  ---------------------------------------------------------------------------------------------------   {Show patient history (optional):23778::" "}   Medications: Outpatient Medications Prior to Visit  Medication Sig  . ezetimibe (ZETIA) 10 MG tablet TAKE 1 TABLET BY MOUTH DAILY  . fluticasone (FLONASE) 50 MCG/ACT nasal spray TAKE 2 SPRAYS INTO BOTH NOSTRIL AT BEDTIME  . ibuprofen (ADVIL,MOTRIN) 600 MG tablet Take 1 tablet (600 mg total) by mouth every 8 (eight) hours as needed for fever, mild pain or moderate pain.  Marland Kitchen lisinopril-hydrochlorothiazide (ZESTORETIC) 20-12.5 MG tablet TAKE 1 TABLET BY MOUTH DAILY  . Multiple Vitamins-Minerals (CENTRUM SILVER ADULT 50+ PO) Take 1 tablet by mouth daily.  . Omega-3 Fatty Acids (FISH OIL PO) Take by mouth.  Marland Kitchen omeprazole (PRILOSEC) 20 MG capsule TAKE 1 CAPSULE BY MOUTH EVERY DAY  . VITAMIN D PO Take by mouth.   No facility-administered medications prior to visit.    Review of Systems  {Heme  Chem  Endocrine  Serology  Results Review (optional):23779::" "}  Objective    There were no vitals taken for this visit. {Show previous vital signs (optional):23777::" "}  Physical Exam  ***  No results found for any visits on 04/29/20.  Assessment & Plan     ***  No follow-ups on file.      {provider attestation***:1}   Vernie Murders, [place] [phone] (phone) [phone] (fax)  [place]

## 2020-04-29 ENCOUNTER — Other Ambulatory Visit: Payer: Self-pay

## 2020-04-29 ENCOUNTER — Encounter: Payer: Self-pay | Admitting: Family Medicine

## 2020-04-29 ENCOUNTER — Ambulatory Visit (INDEPENDENT_AMBULATORY_CARE_PROVIDER_SITE_OTHER): Payer: 59 | Admitting: Family Medicine

## 2020-04-29 ENCOUNTER — Other Ambulatory Visit (HOSPITAL_COMMUNITY)
Admission: RE | Admit: 2020-04-29 | Discharge: 2020-04-29 | Disposition: A | Discharge status: Home / Self Care | Payer: 59 | Source: Ambulatory Visit | Attending: Family Medicine | Admitting: Family Medicine

## 2020-04-29 VITALS — BP 146/54 | HR 69 | Temp 98.4°F | Ht 64.0 in | Wt 200.0 lb

## 2020-04-29 DIAGNOSIS — Z1231 Encounter for screening mammogram for malignant neoplasm of breast: Secondary | ICD-10-CM

## 2020-04-29 DIAGNOSIS — E669 Obesity, unspecified: Secondary | ICD-10-CM

## 2020-04-29 DIAGNOSIS — Z124 Encounter for screening for malignant neoplasm of cervix: Secondary | ICD-10-CM | POA: Insufficient documentation

## 2020-04-29 DIAGNOSIS — E7800 Pure hypercholesterolemia, unspecified: Secondary | ICD-10-CM

## 2020-04-29 DIAGNOSIS — Z13820 Encounter for screening for osteoporosis: Secondary | ICD-10-CM

## 2020-04-29 DIAGNOSIS — I10 Essential (primary) hypertension: Secondary | ICD-10-CM

## 2020-04-29 DIAGNOSIS — Z23 Encounter for immunization: Secondary | ICD-10-CM | POA: Diagnosis not present

## 2020-04-29 NOTE — Progress Notes (Signed)
Complete physical exam   [name]   DOB: June 23, 1956   64 y.o. Female  MRN: [id number] Visit Date: 04/29/2020  Today's healthcare provider: Vernie Murders, PA   Chief Complaint  Patient presents with   Annual Exam   Hyperlipidemia   Hypertension   Subjective    Bridget Green is a 64 y.o. female who presents today for a complete physical exam.  She reports consuming a general diet. The patient does not participate in regular exercise at present. She generally feels well. She reports sleeping well. She does not have additional problems to discuss today.  HPI  Hypertension, follow-up  BP Readings from Last 3 Encounters:  11/28/19 128/78  08/25/19 (!) 148/72  03/10/19 136/70   Wt Readings from Last 3 Encounters:  11/28/19 194 lb (88 kg)  08/25/19 195 lb (88.5 kg)  03/10/19 196 lb 12.8 oz (89.3 kg)     She was last seen for hypertension 5 months ago.  BP at that visit was 128/78. Management since that visit includes no changes. She reports good compliance with treatment. She is not having side effects.  She is not exercising. She is adherent to low salt diet.   Outside blood pressures are not being checked at home.  She does not smoke.  Use of agents associated with hypertension: none.   --------------------------------------------------------------------------------------------------- Lipid/Cholesterol, follow-up  Last Lipid Panel: Lab Results  Component Value Date   CHOL 211 (H) 08/25/2019   LDLCALC 117 (H) 08/25/2019   HDL 71 08/25/2019   TRIG 134 08/25/2019    She was last seen for this 5 months ago.  Management since that visit includes no change.  She reports good compliance with treatment. She is not having side effects.   Symptoms: No appetite changes No foot ulcerations  No chest pain No chest pressure/discomfort  No dyspnea No orthopnea  No fatigue No lower extremity edema  No palpitations No paroxysmal nocturnal dyspnea   No nausea No numbness or tingling of extremity  No polydipsia No polyuria  No speech difficulty No syncope   She is following a Regular diet. Current exercise: none  Last metabolic panel Lab Results  Component Value Date   GLUCOSE 106 (H) 08/25/2019   NA 143 08/25/2019   K 4.4 08/25/2019   BUN 12 08/25/2019   CREATININE 0.79 08/25/2019   GFRNONAA 79 08/25/2019   GFRAA 91 08/25/2019   CALCIUM 9.7 08/25/2019   AST 22 08/25/2019   ALT 33 (H) 08/25/2019   The 10-year ASCVD risk score Mikey Bussing DC Jr., et al., 2013) is: 7.7%  ---------------------------------------------------------------------------------------------------  Past Medical History:  Diagnosis Date   Arthritis    Family history of adverse reaction to anesthesia    DAD-HARD TO WAKE UP   GERD (gastroesophageal reflux disease)    History of hiatal hernia    Hyperlipidemia    Hypertension    Past Surgical History:  Procedure Laterality Date   ADENOIDECTOMY  1960   APPENDECTOMY  1983   BREAST BIOPSY Right 06/20/2018   stereo for distortion  SMALL COMPLEX SCLEROSING LESION   BREAST EXCISIONAL BIOPSY Right 07/22/2018   NP for  SMALL COMPLEX SCLEROSING LESION   BREAST LUMPECTOMY WITH NEEDLE LOCALIZATION Right 07/22/2018   Procedure: BREAST LUMPECTOMY WITH NEEDLE LOCALIZATION;  Surgeon: Olean Ree, MD;  Location: [facility];  Service: General;  Laterality: Right;   CESAREAN SECTION  [id number]   CHOLECYSTECTOMY  12/2011   LASIK Bilateral 2008   TONSILLECTOMY  1960   Social History   Socioeconomic History   Marital status: Married    Spouse name: Not on file   Number of children: Not on file   Years of education: Not on file   Highest education level: Not on file  Occupational History   Not on file  Tobacco Use   Smoking status: Never Smoker   Smokeless tobacco: Never Used  Vaping Use   Vaping Use: Never used  Substance and Sexual Activity   Alcohol use: Yes    Alcohol/week: 0.0 standard drinks     Comment: OCCASIONALLY   Drug use: No   Sexual activity: Not on file  Other Topics Concern   Not on file  Social History Narrative   Not on file   Social Determinants of Health   Financial Resource Strain:    Difficulty of Paying Living Expenses: Not on file  Food Insecurity:    Worried About [place] in the Last Year: Not on file   Ran Out of Food in the Last Year: Not on file  Transportation Needs:    Lack of Transportation (Medical): Not on file   Lack of Transportation (Non-Medical): Not on file  Physical Activity:    Days of Exercise per Week: Not on file   Minutes of Exercise per Session: Not on file  Stress:    Feeling of Stress : Not on file  Social Connections:    Frequency of Communication with Friends and Family: Not on file   Frequency of Social Gatherings with Friends and Family: Not on file   Attends Religious Services: Not on file   Active Member of Clubs or Organizations: Not on file   Attends Archivist Meetings: Not on file   Marital Status: Not on file  Intimate Partner Violence:    Fear of Current or Ex-Partner: Not on file   Emotionally Abused: Not on file   Physically Abused: Not on file   Sexually Abused: Not on file   Family Status  Relation Name Status   Father  Deceased at age 16   Sister  21   MGM  Deceased at age 81   MGF  Deceased   PGM  Deceased   PGF  Deceased   Mother  (Not Specified)   Cousin  (Not Specified)   Family History  Problem Relation Age of Onset   Colon cancer Father    Arthritis Sister    Hypertension Sister    Heart attack Maternal Grandfather    Heart attack Paternal Grandmother    Thyroid cancer Paternal Grandmother    Aneurysm Paternal Grandfather    Arthritis Mother    Hypertension Mother    Hyperlipidemia Mother    Breast cancer Cousin    Allergies  Allergen Reactions   Penicillins Itching and Other (See Comments)    DID THE REACTION INVOLVE: Swelling of the face/tongue/throat,  SOB, or low BP? No Sudden or severe rash/hives, skin peeling, or the inside of the mouth or nose? No Did it require medical treatment? No When did it last happen? Unknown age If all above answers are "NO", may proceed with cephalosporin use.    Sulfa Antibiotics Itching    Patient Care Team: Oneida Mckamey, Vickki Muff, PA as PCP - General (Physician Assistant)   Medications: Outpatient Medications Prior to Visit  Medication Sig   ezetimibe (ZETIA) 10 MG tablet TAKE 1 TABLET BY MOUTH DAILY   fluticasone (FLONASE) 50 MCG/ACT nasal spray TAKE 2  SPRAYS INTO BOTH NOSTRIL AT BEDTIME   lisinopril-hydrochlorothiazide (ZESTORETIC) 20-12.5 MG tablet TAKE 1 TABLET BY MOUTH DAILY   Multiple Vitamins-Minerals (CENTRUM SILVER ADULT 50+ PO) Take 1 tablet by mouth daily.   Omega-3 Fatty Acids (FISH OIL PO) Take by mouth.   omeprazole (PRILOSEC) 20 MG capsule TAKE 1 CAPSULE BY MOUTH EVERY DAY   VITAMIN D PO Take by mouth.   [DISCONTINUED] ibuprofen (ADVIL,MOTRIN) 600 MG tablet Take 1 tablet (600 mg total) by mouth every 8 (eight) hours as needed for fever, mild pain or moderate pain.   No facility-administered medications prior to visit.    Review of Systems  Constitutional: Negative.   HENT: Negative.    Respiratory: Negative.    Cardiovascular: Negative.   Gastrointestinal: Negative.   Genitourinary: Negative.   Musculoskeletal: Negative.   Skin: Negative.   All other systems reviewed and are negative.    Objective    BP (!) 146/54 (BP Location: Right Arm, Patient Position: Sitting, Cuff Size: Normal)   Pulse 69   Temp 98.4 F (36.9 C) (Oral)   Ht 5\' 4"  (1.626 m)   Wt 200 lb (90.7 kg)   SpO2 100%   BMI 34.33 kg/m  BP Readings from Last 3 Encounters:  04/29/20 (!) 146/54  11/28/19 128/78  08/25/19 (!) 148/72   Wt Readings from Last 3 Encounters:  04/29/20 200 lb (90.7 kg)  11/28/19 194 lb (88 kg)  08/25/19 195 lb (88.5 kg)     Physical Exam Constitutional:      Appearance: She  is well-developed.  HENT:     Head: Normocephalic and atraumatic.     Right Ear: External ear normal.     Left Ear: External ear normal.     Nose: Nose normal.  Eyes:     General:        Right eye: No discharge.     Conjunctiva/sclera: Conjunctivae normal.     Pupils: Pupils are equal, round, and reactive to light.  Neck:     Thyroid: No thyromegaly.     Trachea: No tracheal deviation.  Cardiovascular:     Rate and Rhythm: Normal rate and regular rhythm.     Heart sounds: Normal heart sounds. No murmur heard. Pulmonary:     Effort: Pulmonary effort is normal. No respiratory distress.     Breath sounds: Normal breath sounds. No wheezing or rales.  Chest:     Chest wall: No tenderness.  Abdominal:     General: There is no distension.     Palpations: Abdomen is soft. There is no mass.     Tenderness: There is no abdominal tenderness. There is no guarding or rebound.  Genitourinary:    General: Normal vulva.     Rectum: Normal. Guaiac result negative.  Musculoskeletal:        General: No tenderness. Normal range of motion.     Cervical back: Normal range of motion and neck supple.  Lymphadenopathy:     Cervical: No cervical adenopathy.  Skin:    General: Skin is warm and dry.     Findings: No erythema or rash.  Neurological:     Mental Status: She is alert and oriented to person, place, and time.     Cranial Nerves: No cranial nerve deficit.     Motor: No abnormal muscle tone.     Coordination: Coordination normal.     Deep Tendon Reflexes: Reflexes are normal and symmetric. Reflexes normal.  Psychiatric:  Behavior: Behavior normal.        Thought Content: Thought content normal.        Judgment: Judgment normal.   Last depression screening scores PHQ 2/9 Scores 11/28/2019 08/19/2018 08/24/2017  PHQ - 2 Score 0 0 0  PHQ- 9 Score 2 1 0   Last fall risk screening Fall Risk  11/28/2019  Falls in the past year? 0  Number falls in past yr: 0  Injury with Fall? 0   Follow up Falls evaluation completed   Last Audit-C alcohol use screening Alcohol Use Disorder Test (AUDIT) 11/28/2019  1. How often do you have a drink containing alcohol? 2  2. How many drinks containing alcohol do you have on a typical day when you are drinking? 0  3. How often do you have six or more drinks on one occasion? 0  AUDIT-C Score 2   A score of 3 or more in women, and 4 or more in men indicates increased risk for alcohol abuse, EXCEPT if all of the points are from question 1   No results found for any visits on 04/29/20.  Assessment & Plan    Routine Health Maintenance and Physical Exam  Exercise Activities and Dietary recommendations Goals   Encouraged to lower caloric intake and exercise 30-40 minutes 3-4 days a week.     Immunization History  Administered Date(s) Administered   Influenza,inj,Quad PF,6+ Mos 04/24/2017, 03/10/2019   Influenza-Unspecified 05/10/2017, 04/09/2018   PFIZER SARS-COV-2 Vaccination 10/07/2019, 10/28/2019   Pneumococcal Conjugate-13 04/28/2019   Tdap 03/26/2015   Zoster 11/27/2014   Zoster Recombinat (Shingrix) 04/28/2019, 08/25/2019    Health Maintenance  Topic Date Due   COLONOSCOPY  08/28/2018   INFLUENZA VACCINE  02/08/2020   PNA vac Low Risk Adult (2 of 2 - PPSV23) 04/27/2020   MAMMOGRAM  06/01/2021   TETANUS/TDAP  03/25/2025   DEXA SCAN  Completed   COVID-19 Vaccine  Completed   Hepatitis C Screening  Completed    Discussed health benefits of physical activity, and encouraged her to engage in regular exercise appropriate for her age and condition.  1. Essential (primary) hypertension Fair control on the Zestoretic 20-12.5 mg qd. Labs to be rechecked. No chest pains, palpitations, dizziness or syncope. - CBC with Differential/Platelet - Comprehensive metabolic panel - TSH - Lipid panel  2. Hypercholesterolemia without hypertriglyceridemia Well controlled with Zetia and Omega-3 Fish Oil daily. Continue to work on  weight loss and exercise. Check labs. - Comprehensive metabolic panel - TSH - Lipid panel  3. Need for influenza vaccination - Flu Vaccine QUAD High Dose(Fluad)  4. Encounter for screening mammogram for malignant neoplasm of breast - MM Digital Screening  5. Screening for osteoporosis - DG Bone Density  6. Pap smear for cervical cancer screening Amenorrhea since age 22. No discharge or discomfort. - Cytology - PAP  7. Obesity, Class I, BMI 30-34.9 Unexplained weight gain this year. Will check routine labs and recommend weight loss lifestyle changes. - CBC with Differential/Platelet - Comprehensive metabolic panel - TSH - Lipid panel  8. Need for pneumococcal vaccination - Pneumococcal polysaccharide vaccine 23-valent greater than or equal to 2yo subcutaneous/IM   No follow-ups on file.     Andres Shad, PA, have reviewed all documentation for this visit. The documentation on 04/29/20 for the exam, diagnosis, procedures, and orders are all accurate and complete.    Vernie Murders, [place] [phone] (phone) [phone] (fax)  [place]

## 2020-04-30 LAB — CBC WITH DIFFERENTIAL/PLATELET
Basophils Absolute: 0 10*3/uL (ref 0.0–0.2)
Basos: 1 %
EOS (ABSOLUTE): 0.1 10*3/uL (ref 0.0–0.4)
Eos: 1 %
Hematocrit: 40.7 % (ref 34.0–46.6)
Hemoglobin: 14.2 g/dL (ref 11.1–15.9)
Immature Grans (Abs): 0 10*3/uL (ref 0.0–0.1)
Immature Granulocytes: 0 %
Lymphocytes Absolute: 3 10*3/uL (ref 0.7–3.1)
Lymphs: 50 %
MCH: 31.8 pg (ref 26.6–33.0)
MCHC: 34.9 g/dL (ref 31.5–35.7)
MCV: 91 fL (ref 79–97)
Monocytes Absolute: 0.4 10*3/uL (ref 0.1–0.9)
Monocytes: 7 %
Neutrophils Absolute: 2.5 10*3/uL (ref 1.4–7.0)
Neutrophils: 41 %
Platelets: 234 10*3/uL (ref 150–450)
RBC: 4.46 x10E6/uL (ref 3.77–5.28)
RDW: 12.6 % (ref 11.7–15.4)
WBC: 6.1 10*3/uL (ref 3.4–10.8)

## 2020-04-30 LAB — COMPREHENSIVE METABOLIC PANEL
ALT: 31 IU/L (ref 0–32)
AST: 24 IU/L (ref 0–40)
Albumin/Globulin Ratio: 1.8 (ref 1.2–2.2)
Albumin: 4.5 g/dL (ref 3.8–4.8)
Alkaline Phosphatase: 94 IU/L (ref 44–121)
BUN/Creatinine Ratio: 14 (ref 12–28)
BUN: 12 mg/dL (ref 8–27)
Bilirubin Total: 0.5 mg/dL (ref 0.0–1.2)
CO2: 21 mmol/L (ref 20–29)
Calcium: 9.6 mg/dL (ref 8.7–10.3)
Chloride: 103 mmol/L (ref 96–106)
Creatinine, Ser: 0.85 mg/dL (ref 0.57–1.00)
GFR calc Af Amer: 83 mL/min/{1.73_m2} (ref >59)
GFR calc non Af Amer: 72 mL/min/{1.73_m2} (ref >59)
Globulin, Total: 2.5 g/dL (ref 1.5–4.5)
Glucose: 100 mg/dL — ABNORMAL HIGH (ref 65–99)
Potassium: 4.4 mmol/L (ref 3.5–5.2)
Sodium: 140 mmol/L (ref 134–144)
Total Protein: 7 g/dL (ref 6.0–8.5)

## 2020-04-30 LAB — LIPID PANEL
Chol/HDL Ratio: 3.4 ratio (ref 0.0–4.4)
Cholesterol, Total: 222 mg/dL — ABNORMAL HIGH (ref 100–199)
HDL: 65 mg/dL (ref >39)
LDL Chol Calc (NIH): 135 mg/dL — ABNORMAL HIGH (ref 0–99)
Triglycerides: 127 mg/dL (ref 0–149)
VLDL Cholesterol Cal: 22 mg/dL (ref 5–40)

## 2020-04-30 LAB — TSH: TSH: 0.375 u[IU]/mL — ABNORMAL LOW (ref 0.450–4.500)

## 2020-05-03 LAB — CYTOLOGY - PAP
Diagnosis: NEGATIVE
Diagnosis: REACTIVE

## 2020-05-31 ENCOUNTER — Other Ambulatory Visit: Payer: Self-pay | Admitting: Family Medicine

## 2020-05-31 DIAGNOSIS — K219 Gastro-esophageal reflux disease without esophagitis: Secondary | ICD-10-CM

## 2020-06-09 ENCOUNTER — Other Ambulatory Visit: Payer: 59

## 2020-07-05 ENCOUNTER — Other Ambulatory Visit: Payer: Self-pay | Admitting: Family Medicine

## 2020-07-05 DIAGNOSIS — E7800 Pure hypercholesterolemia, unspecified: Secondary | ICD-10-CM

## 2020-07-05 DIAGNOSIS — I10 Essential (primary) hypertension: Secondary | ICD-10-CM

## 2020-07-14 ENCOUNTER — Ambulatory Visit
Admission: RE | Admit: 2020-07-14 | Discharge: 2020-07-14 | Disposition: A | Discharge status: Home / Self Care | Payer: 59 | Source: Ambulatory Visit | Attending: Family Medicine | Admitting: Family Medicine

## 2020-07-14 ENCOUNTER — Other Ambulatory Visit: Payer: Self-pay

## 2020-07-14 DIAGNOSIS — Z13820 Encounter for screening for osteoporosis: Secondary | ICD-10-CM | POA: Diagnosis present

## 2020-07-14 DIAGNOSIS — Z1231 Encounter for screening mammogram for malignant neoplasm of breast: Secondary | ICD-10-CM | POA: Diagnosis present

## 2020-07-15 ENCOUNTER — Other Ambulatory Visit: Payer: Self-pay | Admitting: Family Medicine

## 2020-07-15 DIAGNOSIS — R928 Other abnormal and inconclusive findings on diagnostic imaging of breast: Secondary | ICD-10-CM

## 2020-07-15 DIAGNOSIS — N6489 Other specified disorders of breast: Secondary | ICD-10-CM

## 2020-07-22 ENCOUNTER — Ambulatory Visit
Admission: RE | Admit: 2020-07-22 | Discharge: 2020-07-22 | Disposition: A | Discharge status: Home / Self Care | Payer: 59 | Source: Ambulatory Visit | Attending: Family Medicine | Admitting: Family Medicine

## 2020-07-22 ENCOUNTER — Other Ambulatory Visit: Payer: Self-pay

## 2020-07-22 DIAGNOSIS — N6489 Other specified disorders of breast: Secondary | ICD-10-CM

## 2020-07-22 DIAGNOSIS — R928 Other abnormal and inconclusive findings on diagnostic imaging of breast: Secondary | ICD-10-CM

## 2020-08-13 ENCOUNTER — Telehealth: Payer: Self-pay

## 2020-08-13 DIAGNOSIS — K219 Gastro-esophageal reflux disease without esophagitis: Secondary | ICD-10-CM

## 2020-08-13 DIAGNOSIS — J309 Allergic rhinitis, unspecified: Secondary | ICD-10-CM

## 2020-08-13 DIAGNOSIS — I10 Essential (primary) hypertension: Secondary | ICD-10-CM

## 2020-08-13 MED ORDER — LISINOPRIL-HYDROCHLOROTHIAZIDE 20-12.5 MG PO TABS: 1.0000 | ORAL_TABLET | Freq: Every day | ORAL | 3 refills | Status: DC

## 2020-08-13 MED ORDER — FLUTICASONE PROPIONATE 50 MCG/ACT NA SUSP: NASAL | 5 refills | Status: DC

## 2020-08-13 MED ORDER — OMEPRAZOLE 20 MG PO CPDR: DELAYED_RELEASE_CAPSULE | ORAL | 1 refills | Status: DC

## 2020-08-13 NOTE — Telephone Encounter (Signed)
[facility] faxed refill request for the following medications:  lisinopril-hydrochlorothiazide (ZESTORETIC) 20-12.5 MG tablet omeprazole (PRILOSEC) 20 MG capsule fluticasone (FLONASE) 50 MCG/ACT nasal spray   Please advise. Thanks, [employer]

## 2020-08-17 ENCOUNTER — Telehealth: Payer: Self-pay | Admitting: Family Medicine

## 2020-08-17 DIAGNOSIS — I10 Essential (primary) hypertension: Secondary | ICD-10-CM

## 2020-08-17 DIAGNOSIS — K219 Gastro-esophageal reflux disease without esophagitis: Secondary | ICD-10-CM

## 2020-08-17 DIAGNOSIS — J309 Allergic rhinitis, unspecified: Secondary | ICD-10-CM

## 2020-08-17 MED ORDER — LISINOPRIL-HYDROCHLOROTHIAZIDE 20-12.5 MG PO TABS: 1.0000 | ORAL_TABLET | Freq: Every day | ORAL | 3 refills | Status: DC

## 2020-08-17 MED ORDER — FLUTICASONE PROPIONATE 50 MCG/ACT NA SUSP: NASAL | 5 refills | Status: DC

## 2020-08-17 MED ORDER — OMEPRAZOLE 20 MG PO CPDR: DELAYED_RELEASE_CAPSULE | ORAL | 1 refills | Status: DC

## 2020-08-17 NOTE — Telephone Encounter (Signed)
[facility] faxed refill request for the following medications:  1. lisinopril-hydrochlorothiazide (ZESTORETIC) 20-12.5 MG tablet 2. omeprazole (PRILOSEC) 20 MG capsule 3. fluticasone (FLONASE) 50 MCG/ACT nasal spray  Last Rx: 08/13/20 they were sent to [facility] LOV: Please advise. Thanks TNP

## 2020-10-12 DIAGNOSIS — H903 Sensorineural hearing loss, bilateral: Secondary | ICD-10-CM | POA: Diagnosis not present

## 2020-10-19 DIAGNOSIS — H903 Sensorineural hearing loss, bilateral: Secondary | ICD-10-CM | POA: Diagnosis not present

## 2020-11-29 ENCOUNTER — Other Ambulatory Visit: Payer: Self-pay

## 2020-11-29 ENCOUNTER — Ambulatory Visit (INDEPENDENT_AMBULATORY_CARE_PROVIDER_SITE_OTHER): Payer: Medicare HMO | Admitting: Family Medicine

## 2020-11-29 VITALS — BP 152/57 | HR 68 | Temp 98.2°F | Wt 200.0 lb

## 2020-11-29 DIAGNOSIS — J301 Allergic rhinitis due to pollen: Secondary | ICD-10-CM | POA: Diagnosis not present

## 2020-11-29 DIAGNOSIS — E7800 Pure hypercholesterolemia, unspecified: Secondary | ICD-10-CM | POA: Diagnosis not present

## 2020-11-29 DIAGNOSIS — E559 Vitamin D deficiency, unspecified: Secondary | ICD-10-CM

## 2020-11-29 DIAGNOSIS — I10 Essential (primary) hypertension: Secondary | ICD-10-CM | POA: Diagnosis not present

## 2020-11-29 DIAGNOSIS — Z1211 Encounter for screening for malignant neoplasm of colon: Secondary | ICD-10-CM | POA: Diagnosis not present

## 2020-11-29 DIAGNOSIS — Z6833 Body mass index (BMI) 33.0-33.9, adult: Secondary | ICD-10-CM | POA: Diagnosis not present

## 2020-11-29 MED ORDER — EZETIMIBE 10 MG PO TABS
10.0000 mg | ORAL_TABLET | Freq: Every day | ORAL | 3 refills | Status: DC
Start: 2020-11-29 — End: 2021-09-08

## 2020-11-29 NOTE — Progress Notes (Signed)
Complete physical exam   [name]   DOB: 1956/07/03   65 y.o. Female  MRN: [id number] Visit Date: 11/29/2020  Today's healthcare provider: Vernie Murders, PA-C   No chief complaint on file.  Subjective    Bridget Green is a 65 y.o. female who presents today for a complete physical exam.   She generally feels well. She reports sleeping well. She does not have additional problems to discuss today.    PATIENT IS DUE FOR COLONOSCOPY  Past Medical History:  Diagnosis Date   Arthritis    Family history of adverse reaction to anesthesia    DAD-HARD TO WAKE UP   GERD (gastroesophageal reflux disease)    History of hiatal hernia    Hyperlipidemia    Hypertension    Past Surgical History:  Procedure Laterality Date   ADENOIDECTOMY  1960   APPENDECTOMY  1983   BREAST BIOPSY Right 06/20/2018   stereo for distortion  SMALL COMPLEX SCLEROSING LESION   BREAST EXCISIONAL BIOPSY Right 07/22/2018   NP for  SMALL COMPLEX SCLEROSING LESION   BREAST LUMPECTOMY WITH NEEDLE LOCALIZATION Right 07/22/2018   Procedure: BREAST LUMPECTOMY WITH NEEDLE LOCALIZATION;  Surgeon: Olean Ree, MD;  Location: [facility];  Service: General;  Laterality: Right;   CESAREAN SECTION  [id number]   CHOLECYSTECTOMY  12/2011   LASIK Bilateral 2008   TONSILLECTOMY  1960   Social History   Socioeconomic History   Marital status: Married    Spouse name: Not on file   Number of children: Not on file   Years of education: Not on file   Highest education level: Not on file  Occupational History   Not on file  Tobacco Use   Smoking status: Never Smoker   Smokeless tobacco: Never Used  Vaping Use   Vaping Use: Never used  Substance and Sexual Activity   Alcohol use: Yes    Alcohol/week: 0.0 standard drinks    Comment: OCCASIONALLY   Drug use: No   Sexual activity: Not on file  Other Topics Concern   Not on file  Social History Narrative   Not on file   Social Determinants of  Health   Financial Resource Strain: Not on file  Food Insecurity: Not on file  Transportation Needs: Not on file  Physical Activity: Not on file  Stress: Not on file  Social Connections: Not on file  Intimate Partner Violence: Not on file   Family Status  Relation Name Status   Father  Deceased at age 43   Sister  Alive   MGM  Deceased at age 25   MGF  Deceased   PGM  Deceased   PGF  Deceased   Mother  (Not Specified)   Cousin  (Not Specified)   Family History  Problem Relation Age of Onset   Colon cancer Father    Arthritis Sister    Hypertension Sister    Heart attack Maternal Grandfather    Heart attack Paternal Grandmother    Thyroid cancer Paternal Grandmother    Aneurysm Paternal Grandfather    Arthritis Mother    Hypertension Mother    Hyperlipidemia Mother    Breast cancer Cousin    Allergies  Allergen Reactions   Penicillins Itching and Other (See Comments)    DID THE REACTION INVOLVE: Swelling of the face/tongue/throat, SOB, or low BP? No Sudden or severe rash/hives, skin peeling, or the inside of the mouth or nose? No Did it require  medical treatment? No When did it last happen? Unknown age If all above answers are "NO", may proceed with cephalosporin use.    Sulfa Antibiotics Itching    Patient Care Team: Ily Denno, Vickki Muff, PA-C as PCP - General (Physician Assistant)   Medications: Outpatient Medications Prior to Visit  Medication Sig   ezetimibe (ZETIA) 10 MG tablet TAKE ONE TABLET EVERY DAY   fluticasone (FLONASE) 50 MCG/ACT nasal spray TAKE 2 SPRAYS INTO BOTH NOSTRIL AT BEDTIME   lisinopril-hydrochlorothiazide (ZESTORETIC) 20-12.5 MG tablet Take 1 tablet by mouth daily.   Multiple Vitamins-Minerals (CENTRUM SILVER ADULT 50+ PO) Take 1 tablet by mouth daily.   Omega-3 Fatty Acids (FISH OIL PO) Take by mouth.   omeprazole (PRILOSEC) 20 MG capsule TAKE 1 CAPSULE BY MOUTH EVERY DAY   VITAMIN D PO Take by mouth.   No facility-administered  medications prior to visit.    Review of Systems  Constitutional: Negative.   HENT: Negative.    Eyes: Negative.   Respiratory: Negative.    Cardiovascular: Negative.   Gastrointestinal: Negative.   Endocrine: Negative.   Genitourinary: Negative.   Musculoskeletal: Negative.   Skin: Negative.   Allergic/Immunologic: Negative.   Neurological: Negative.   Hematological: Negative.   Psychiatric/Behavioral: Negative.        Objective    BP (!) 152/57 (BP Location: Right Arm, Patient Position: Sitting, Cuff Size: Normal)   Pulse 68   Temp 98.2 F (36.8 C) (Oral)   Wt 200 lb (90.7 kg)   SpO2 100%   BMI 34.33 kg/m     Physical Exam Constitutional:      Appearance: Normal appearance. She is normal weight.  HENT:     Head: Normocephalic and atraumatic.     Right Ear: Tympanic membrane, ear canal and external ear normal.     Left Ear: Tympanic membrane, ear canal and external ear normal.     Nose: Nose normal.     Mouth/Throat:     Mouth: Mucous membranes are moist.     Pharynx: Oropharynx is clear.  Eyes:     Extraocular Movements: Extraocular movements intact.     Conjunctiva/sclera: Conjunctivae normal.     Pupils: Pupils are equal, round, and reactive to light.  Cardiovascular:     Rate and Rhythm: Normal rate and regular rhythm.     Pulses: Normal pulses.     Heart sounds: Normal heart sounds.  Pulmonary:     Effort: Pulmonary effort is normal.     Breath sounds: Normal breath sounds.  Abdominal:     General: Abdomen is flat. Bowel sounds are normal.     Palpations: Abdomen is soft.  Musculoskeletal:        General: Normal range of motion.     Cervical back: Normal range of motion and neck supple.  Skin:    General: Skin is warm and dry.  Neurological:     General: No focal deficit present.     Mental Status: She is alert and oriented to person, place, and time. Mental status is at baseline.  Psychiatric:        Mood and Affect: Mood normal.         Behavior: Behavior normal.        Thought Content: Thought content normal.        Judgment: Judgment normal.       Last depression screening scores PHQ 2/9 Scores 11/28/2019 08/19/2018 08/24/2017  PHQ - 2 Score 0 0 0  PHQ- 9  Score 2 1 0   Last fall risk screening Fall Risk  11/28/2019  Falls in the past year? 0  Number falls in past yr: 0  Injury with Fall? 0  Follow up Falls evaluation completed   Last Audit-C alcohol use screening Alcohol Use Disorder Test (AUDIT) 11/28/2019  1. How often do you have a drink containing alcohol? 2  2. How many drinks containing alcohol do you have on a typical day when you are drinking? 0  3. How often do you have six or more drinks on one occasion? 0  AUDIT-C Score 2   A score of 3 or more in women, and 4 or more in men indicates increased risk for alcohol abuse, EXCEPT if all of the points are from question 1   No results found for any visits on 11/29/20.  Assessment & Plan    Routine Health Maintenance and Physical Exam  Exercise Activities and Dietary recommendations Goals   Recommend low fat diet and regular exercise for 30-40 minutes 3-4 days a week.     Immunization History  Administered Date(s) Administered   Fluad Quad(high Dose 65+) 04/29/2020   Influenza,inj,Quad PF,6+ Mos 04/24/2017, 03/10/2019   Influenza-Unspecified 05/10/2017, 04/09/2018   PFIZER(Purple Top)SARS-COV-2 Vaccination 10/07/2019, 10/28/2019   Pneumococcal Conjugate-13 04/28/2019   Pneumococcal Polysaccharide-23 04/29/2020   Tdap 03/26/2015   Zoster 11/27/2014   Zoster Recombinat (Shingrix) 04/28/2019, 08/25/2019    Health Maintenance  Topic Date Due   COLONOSCOPY (Pts 45-50yrs Insurance coverage will need to be confirmed)  08/28/2018   COVID-19 Vaccine (3 - Booster for Pfizer series) 03/29/2020   INFLUENZA VACCINE  02/07/2021   MAMMOGRAM  07/14/2022   TETANUS/TDAP  03/25/2025   DEXA SCAN  Completed   Hepatitis C Screening  Completed   PNA vac Low  Risk Adult  Completed   HPV VACCINES  Aged Out    Discussed health benefits of physical activity, and encouraged her to engage in regular exercise appropriate for her age and condition.  1. Essential (primary) hypertension Systolic pressure up some today. Goal 135/85. Tolerating Zestorectic 20-12.5 mg qd. Requests postponing more medications and let her work on lifestyle changes and weight loss. Recheck labs. Follow up pending reports. - CBC with Differential/Platelet - Comprehensive metabolic panel - Lipid panel - TSH  2. Hypercholesterolemia Tolerating the Zetia with Co-Q 10 and Omega-3 fish oil. Need to lose weight and get back on the low fat diet. Refilled medication and recheck labs. - ezetimibe (ZETIA) 10 MG tablet; Take 1 tablet (10 mg total) by mouth daily.  Dispense: 90 tablet; Refill: 3 - Comprehensive metabolic panel - Lipid panel - TSH  3. Seasonal allergic rhinitis due to pollen Rhinorrhea and sneezing in the spring pollen season. Recommend Flonase Nasal Spray every night during peak season. May add OTC antihistamine if needed. Check labs for signs of infection. - CBC with Differential/Platelet  4. Body mass index (BMI) of 33.0-33.9 in adult Weight same as last year. BMI 34.33 and counseled 10 minutes regarding weight loss programs to include regular exercise. Recheck labs. - CBC with Differential/Platelet - Comprehensive metabolic panel - TSH  5. Vitamin D deficiency Still taking some vitamin D supplement. Last level was 25.1 two years ago. Recheck labs. - CBC with Differential/Platelet - VITAMIN D 25 Hydroxy (Vit-D Deficiency, Fractures)  6. Screening for colon cancer - Ambulatory referral to Gastroenterology   No follow-ups on file.     I, Monette Omara, PA-C, have reviewed all documentation for this visit.  The documentation on 03/31/21 for the exam, diagnosis, procedures, and orders are all accurate and complete.    Vernie Murders, PA-C  [employer] [phone] (phone) [phone] (fax)  [place]

## 2020-11-30 LAB — CBC WITH DIFFERENTIAL/PLATELET
Basophils Absolute: 0 10*3/uL (ref 0.0–0.2)
Basos: 1 %
EOS (ABSOLUTE): 0.1 10*3/uL (ref 0.0–0.4)
Eos: 1 %
Hematocrit: 43.3 % (ref 34.0–46.6)
Hemoglobin: 14.4 g/dL (ref 11.1–15.9)
Immature Grans (Abs): 0 10*3/uL (ref 0.0–0.1)
Immature Granulocytes: 0 %
Lymphocytes Absolute: 3.6 10*3/uL — ABNORMAL HIGH (ref 0.7–3.1)
Lymphs: 53 %
MCH: 30.1 pg (ref 26.6–33.0)
MCHC: 33.3 g/dL (ref 31.5–35.7)
MCV: 90 fL (ref 79–97)
Monocytes Absolute: 0.5 10*3/uL (ref 0.1–0.9)
Monocytes: 8 %
Neutrophils Absolute: 2.4 10*3/uL (ref 1.4–7.0)
Neutrophils: 37 %
Platelets: 239 10*3/uL (ref 150–450)
RBC: 4.79 x10E6/uL (ref 3.77–5.28)
RDW: 12.8 % (ref 11.7–15.4)
WBC: 6.6 10*3/uL (ref 3.4–10.8)

## 2020-11-30 LAB — COMPREHENSIVE METABOLIC PANEL
ALT: 30 IU/L (ref 0–32)
AST: 26 IU/L (ref 0–40)
Albumin/Globulin Ratio: 1.7 (ref 1.2–2.2)
Albumin: 4.4 g/dL (ref 3.8–4.8)
Alkaline Phosphatase: 96 IU/L (ref 44–121)
BUN/Creatinine Ratio: 15 (ref 12–28)
BUN: 12 mg/dL (ref 8–27)
Bilirubin Total: 0.5 mg/dL (ref 0.0–1.2)
CO2: 22 mmol/L (ref 20–29)
Calcium: 9.5 mg/dL (ref 8.7–10.3)
Chloride: 103 mmol/L (ref 96–106)
Creatinine, Ser: 0.82 mg/dL (ref 0.57–1.00)
Globulin, Total: 2.6 g/dL (ref 1.5–4.5)
Glucose: 98 mg/dL (ref 65–99)
Potassium: 4.4 mmol/L (ref 3.5–5.2)
Sodium: 142 mmol/L (ref 134–144)
Total Protein: 7 g/dL (ref 6.0–8.5)
eGFR: 79 mL/min/{1.73_m2} (ref >59)

## 2020-11-30 LAB — TSH: TSH: 0.6 u[IU]/mL (ref 0.450–4.500)

## 2020-11-30 LAB — LIPID PANEL
Chol/HDL Ratio: 3.2 ratio (ref 0.0–4.4)
Cholesterol, Total: 202 mg/dL — ABNORMAL HIGH (ref 100–199)
HDL: 64 mg/dL (ref >39)
LDL Chol Calc (NIH): 111 mg/dL — ABNORMAL HIGH (ref 0–99)
Triglycerides: 156 mg/dL — ABNORMAL HIGH (ref 0–149)
VLDL Cholesterol Cal: 27 mg/dL (ref 5–40)

## 2020-11-30 LAB — VITAMIN D 25 HYDROXY (VIT D DEFICIENCY, FRACTURES): Vit D, 25-Hydroxy: 35.6 ng/mL (ref 30.0–100.0)

## 2020-12-08 ENCOUNTER — Ambulatory Visit: Payer: Self-pay | Admitting: *Deleted

## 2020-12-08 DIAGNOSIS — S8265XA Nondisplaced fracture of lateral malleolus of left fibula, initial encounter for closed fracture: Secondary | ICD-10-CM | POA: Diagnosis not present

## 2020-12-08 NOTE — Telephone Encounter (Signed)
Summary: sprained ankle   Pt fell Monday am walking stepped in a hole in the ground, went down and left ankle was sprained. It has been on ice ever since swelling is still there, wants advice on what to do. [phone]      Call to patient- patient states she fell while walking on track on Monday and twisted her ankle. Patient advised needs to be seen- call to office- no appointment- advised UC for evaluation Reason for Disposition . [1] Limp when walking AND [2] due to a twisted ankle or foot  Answer Assessment - Initial Assessment Questions 1. MECHANISM: "How did the injury happen?" (e.g., twisting injury, direct blow)      Fell while walking at track- L 2. ONSET: "When did the injury happen?" (Minutes or hours ago)      Monday 3. LOCATION: "Where is the injury located?"      Left ankle 4. APPEARANCE of INJURY: "What does the injury look like?"      Swelling of foot and above the ankle 5. WEIGHT-BEARING: "Can you put weight on that foot?" "Can you walk (four steps or more)?"       Yes- painful 6. SIZE: For cuts, bruises, or swelling, ask: "How large is it?" (e.g., inches or centimeters;  entire joint)      Bruising at bend of foot, swelling all of foot and above the ankle 7. PAIN: "Is there pain?" If Yes, ask: "How bad is the pain?"    (e.g., Scale 1-10; or mild, moderate, severe)   - NONE (0): no pain.   - MILD (1-3): doesn't interfere with normal activities.    - MODERATE (4-7): interferes with normal activities (e.g., work or school) or awakens from sleep, limping.    - SEVERE (8-10): excruciating pain, unable to do any normal activities, unable to walk.      Mild if keeps foot up 8. TETANUS: For any breaks in the skin, ask: "When was the last tetanus booster?"     Up to date 9. OTHER SYMPTOMS: "Do you have any other symptoms?"      no 10. PREGNANCY: "Is there any chance you are pregnant?" "When was your last menstrual period?"       n/a  Protocols used: ANKLE AND FOOT  INJURY-A-AH

## 2020-12-08 NOTE — Telephone Encounter (Signed)
Fyi. Thanks.

## 2020-12-15 DIAGNOSIS — S8265XD Nondisplaced fracture of lateral malleolus of left fibula, subsequent encounter for closed fracture with routine healing: Secondary | ICD-10-CM | POA: Diagnosis not present

## 2021-01-05 DIAGNOSIS — S8265XD Nondisplaced fracture of lateral malleolus of left fibula, subsequent encounter for closed fracture with routine healing: Secondary | ICD-10-CM | POA: Diagnosis not present

## 2021-01-17 ENCOUNTER — Telehealth: Payer: Medicare HMO | Admitting: Gastroenterology

## 2021-01-17 DIAGNOSIS — Z1211 Encounter for screening for malignant neoplasm of colon: Secondary | ICD-10-CM

## 2021-01-17 DIAGNOSIS — Z800 Family history of malignant neoplasm of digestive organs: Secondary | ICD-10-CM

## 2021-01-17 MED ORDER — NA SULFATE-K SULFATE-MG SULF 17.5-3.13-1.6 GM/177ML PO SOLN: 1.0000 | Freq: Once | ORAL | 0 refills | Status: AC

## 2021-01-17 NOTE — Progress Notes (Signed)
Gastroenterology Pre-Procedure Review  Request Date: 02/10/21 Requesting Physician: Dr. Allen Norris  PATIENT REVIEW QUESTIONS: The patient responded to the following health history questions as indicated:    1. Are you having any GI issues? no 2. Do you have a personal history of Polyps?  2020 unsure if polyps was removed. 3. Do you have a family history of Colon Cancer or Polyps? yes (father colon cancer) 4. Diabetes Mellitus? no 5. Joint replacements in the past 12 months?no 6. Major health problems in the past 3 months?no 7. Any artificial heart valves, MVP, or defibrillator?no    MEDICATIONS & ALLERGIES:    Patient reports the following regarding taking any anticoagulation/antiplatelet therapy:   Plavix, Coumadin, Eliquis, Xarelto, Lovenox, Pradaxa, Brilinta, or Effient? no Aspirin? no  Patient confirms/reports the following medications:  Current Outpatient Medications  Medication Sig Dispense Refill   ezetimibe (ZETIA) 10 MG tablet Take 1 tablet (10 mg total) by mouth daily. 90 tablet 3   fluticasone (FLONASE) 50 MCG/ACT nasal spray TAKE 2 SPRAYS INTO BOTH NOSTRIL AT BEDTIME 16 g 5   lisinopril-hydrochlorothiazide (ZESTORETIC) 20-12.5 MG tablet Take 1 tablet by mouth daily. 90 tablet 3   Multiple Vitamins-Minerals (CENTRUM SILVER ADULT 50+ PO) Take 1 tablet by mouth daily.     Omega-3 Fatty Acids (FISH OIL PO) Take by mouth.     omeprazole (PRILOSEC) 20 MG capsule TAKE 1 CAPSULE BY MOUTH EVERY DAY 90 capsule 1   VITAMIN D PO Take by mouth.     No current facility-administered medications for this visit.    Patient confirms/reports the following allergies:  Allergies  Allergen Reactions   Penicillins Itching and Other (See Comments)    DID THE REACTION INVOLVE: Swelling of the face/tongue/throat, SOB, or low BP? No Sudden or severe rash/hives, skin peeling, or the inside of the mouth or nose? No Did it require medical treatment? No When did it last happen? Unknown age If all  above answers are "NO", may proceed with cephalosporin use.    Sulfa Antibiotics Itching    No orders of the defined types were placed in this encounter.   AUTHORIZATION INFORMATION Primary Insurance: 1D#: Group #:  Secondary Insurance: 1D#: Group #:  SCHEDULE INFORMATION: Date: 02/10/21 Time: Location: [place]

## 2021-01-31 ENCOUNTER — Other Ambulatory Visit: Payer: Self-pay

## 2021-01-31 ENCOUNTER — Encounter: Payer: Self-pay | Admitting: Gastroenterology

## 2021-01-31 ENCOUNTER — Other Ambulatory Visit: Payer: Self-pay | Admitting: Family Medicine

## 2021-01-31 DIAGNOSIS — K219 Gastro-esophageal reflux disease without esophagitis: Secondary | ICD-10-CM

## 2021-01-31 DIAGNOSIS — J309 Allergic rhinitis, unspecified: Secondary | ICD-10-CM

## 2021-02-09 DIAGNOSIS — S8265XD Nondisplaced fracture of lateral malleolus of left fibula, subsequent encounter for closed fracture with routine healing: Secondary | ICD-10-CM | POA: Diagnosis not present

## 2021-02-10 ENCOUNTER — Ambulatory Visit: Payer: Medicare HMO | Admitting: Anesthesiology

## 2021-02-10 ENCOUNTER — Other Ambulatory Visit: Payer: Self-pay

## 2021-02-10 ENCOUNTER — Ambulatory Visit
Admission: RE | Admit: 2021-02-10 | Discharge: 2021-02-10 | Disposition: A | Discharge status: Home / Self Care | Payer: Medicare HMO | Attending: Gastroenterology | Admitting: Gastroenterology

## 2021-02-10 ENCOUNTER — Encounter: Payer: Self-pay | Admitting: Gastroenterology

## 2021-02-10 ENCOUNTER — Encounter
Admission: RE | Disposition: A | Discharge status: Home / Self Care | Payer: Self-pay | Source: Home / Self Care | Attending: Gastroenterology

## 2021-02-10 DIAGNOSIS — D123 Benign neoplasm of transverse colon: Secondary | ICD-10-CM | POA: Diagnosis not present

## 2021-02-10 DIAGNOSIS — Z8371 Family history of colonic polyps: Secondary | ICD-10-CM | POA: Insufficient documentation

## 2021-02-10 DIAGNOSIS — K635 Polyp of colon: Secondary | ICD-10-CM | POA: Diagnosis not present

## 2021-02-10 DIAGNOSIS — Z882 Allergy status to sulfonamides status: Secondary | ICD-10-CM | POA: Insufficient documentation

## 2021-02-10 DIAGNOSIS — Z1211 Encounter for screening for malignant neoplasm of colon: Secondary | ICD-10-CM

## 2021-02-10 DIAGNOSIS — D122 Benign neoplasm of ascending colon: Secondary | ICD-10-CM | POA: Diagnosis not present

## 2021-02-10 DIAGNOSIS — Z79899 Other long term (current) drug therapy: Secondary | ICD-10-CM | POA: Diagnosis not present

## 2021-02-10 DIAGNOSIS — D126 Benign neoplasm of colon, unspecified: Secondary | ICD-10-CM | POA: Diagnosis not present

## 2021-02-10 DIAGNOSIS — Z800 Family history of malignant neoplasm of digestive organs: Secondary | ICD-10-CM | POA: Insufficient documentation

## 2021-02-10 DIAGNOSIS — Z880 Allergy status to penicillin: Secondary | ICD-10-CM | POA: Diagnosis not present

## 2021-02-10 DIAGNOSIS — K640 First degree hemorrhoids: Secondary | ICD-10-CM | POA: Diagnosis not present

## 2021-02-10 HISTORY — PX: POLYPECTOMY: SHX5525

## 2021-02-10 HISTORY — PX: COLONOSCOPY WITH PROPOFOL: SHX5780

## 2021-02-10 SURGERY — COLONOSCOPY WITH PROPOFOL
Anesthesia: General | Site: Rectum

## 2021-02-10 MED ORDER — ONDANSETRON HCL 4 MG/2ML IJ SOLN: 4.0000 mg | Freq: Once | INTRAMUSCULAR | Status: DC | PRN

## 2021-02-10 MED ORDER — STERILE WATER FOR IRRIGATION IR SOLN
Status: DC | PRN
  Administered 2021-02-10: .05 mL

## 2021-02-10 MED ORDER — ACETAMINOPHEN 160 MG/5ML PO SOLN: 325.0000 mg | ORAL | Status: DC | PRN

## 2021-02-10 MED ORDER — LACTATED RINGERS IV SOLN: INTRAVENOUS | Status: DC

## 2021-02-10 MED ORDER — LIDOCAINE HCL (CARDIAC) PF 100 MG/5ML IV SOSY
PREFILLED_SYRINGE | INTRAVENOUS | Status: DC | PRN
  Administered 2021-02-10: 50 mg via INTRAVENOUS

## 2021-02-10 MED ORDER — PROPOFOL 10 MG/ML IV BOLUS
INTRAVENOUS | Status: DC | PRN
  Administered 2021-02-10: 80 mg via INTRAVENOUS
  Administered 2021-02-10 (×8): 20 mg via INTRAVENOUS

## 2021-02-10 MED ORDER — ACETAMINOPHEN 325 MG PO TABS: 325.0000 mg | ORAL_TABLET | ORAL | Status: DC | PRN

## 2021-02-10 SURGICAL SUPPLY — 8 items
GOWN CVR UNV OPN BCK APRN NK (MISCELLANEOUS) ×4 IMPLANT
GOWN ISOL THUMB LOOP REG UNIV (MISCELLANEOUS) ×6
KIT PRC NS LF DISP ENDO (KITS) ×2 IMPLANT
KIT PROCEDURE OLYMPUS (KITS) ×3
MANIFOLD NEPTUNE II (INSTRUMENTS) ×3 IMPLANT
SNARE COLD EXACTO (MISCELLANEOUS) ×3 IMPLANT
TRAP ETRAP POLY (MISCELLANEOUS) ×3 IMPLANT
WATER STERILE IRR 250ML POUR (IV SOLUTION) ×3 IMPLANT

## 2021-02-10 NOTE — Anesthesia Procedure Notes (Signed)
Date/Time: 02/10/2021 10:03 AM Performed by: Mayme Genta, CRNA Pre-anesthesia Checklist: Patient identified, Emergency Drugs available, Suction available, Timeout performed and Patient being monitored Patient Re-evaluated:Patient Re-evaluated prior to induction Oxygen Delivery Method: Nasal cannula Placement Confirmation: positive ETCO2

## 2021-02-10 NOTE — Anesthesia Postprocedure Evaluation (Signed)
Anesthesia Post Note  [name]  Procedure(s) Performed: COLONOSCOPY WITH PROPOFOL (Rectum) POLYPECTOMY (Rectum)     Patient location during evaluation: PACU Anesthesia Type: General Level of consciousness: awake Pain management: pain level controlled Vital Signs Assessment: post-procedure vital signs reviewed and stable Respiratory status: respiratory function stable Cardiovascular status: stable Postop Assessment: no signs of nausea or vomiting Anesthetic complications: no   No notable events documented.  Veda Canning

## 2021-02-10 NOTE — Transfer of Care (Signed)
Immediate Anesthesia Transfer of Care Note  [name]  Procedure(s) Performed: COLONOSCOPY WITH PROPOFOL (Rectum) POLYPECTOMY (Rectum)  Patient Location: PACU  Anesthesia Type: General  Level of Consciousness: awake, alert  and patient cooperative  Airway and Oxygen Therapy: Patient Spontanous Breathing and Patient connected to supplemental oxygen  Post-op Assessment: Post-op Vital signs reviewed, Patient's Cardiovascular Status Stable, Respiratory Function Stable, Patent Airway and No signs of Nausea or vomiting  Post-op Vital Signs: Reviewed and stable  Complications: No notable events documented.

## 2021-02-10 NOTE — Anesthesia Preprocedure Evaluation (Signed)
Anesthesia Evaluation  Patient identified by MRN, date of birth, ID band Patient awake    Reviewed: Allergy & Precautions, NPO status   Airway Mallampati: II  TM Distance: >3 FB     Dental   Pulmonary    Pulmonary exam normal        Cardiovascular hypertension,  Rhythm:Regular Rate:Normal     Neuro/Psych    GI/Hepatic hiatal hernia, GERD  ,  Endo/Other  Obesity - BMI > 30  Renal/GU      Musculoskeletal  (+) Arthritis ,   Abdominal   Peds  Hematology   Anesthesia Other Findings   Reproductive/Obstetrics                             Anesthesia Physical Anesthesia Plan  ASA: 2  Anesthesia Plan: General   Post-op Pain Management:    Induction: Intravenous  PONV Risk Score and Plan: Propofol infusion, TIVA and Treatment may vary due to age or medical condition  Airway Management Planned: Natural Airway and Nasal Cannula  Additional Equipment:   Intra-op Plan:   Post-operative Plan:   Informed Consent: I have reviewed the patients History and Physical, chart, labs and discussed the procedure including the risks, benefits and alternatives for the proposed anesthesia with the patient or authorized representative who has indicated his/her understanding and acceptance.       Plan Discussed with: CRNA  Anesthesia Plan Comments:         Anesthesia Quick Evaluation

## 2021-02-10 NOTE — H&P (Signed)
Bridget Lame, MD [address] [place],  [address] Phone:[phone] Fax : [phone]  Primary Care Physician:  Bridget Common, PA-C Primary Gastroenterologist:  Dr. Allen Green  Pre-Procedure History & Physical: HPI:  Bridget Green is a 66 y.o. female is here for an colonoscopy.   Past Medical History:  Diagnosis Date   Arthritis    Family history of adverse reaction to anesthesia    DAD-HARD TO WAKE UP   GERD (gastroesophageal reflux disease)    History of hiatal hernia    Hyperlipidemia    Hypertension     Past Surgical History:  Procedure Laterality Date   ADENOIDECTOMY  1960   APPENDECTOMY  1983   BREAST BIOPSY Right 06/20/2018   stereo for distortion  SMALL COMPLEX SCLEROSING LESION   BREAST EXCISIONAL BIOPSY Right 07/22/2018   NP for  SMALL COMPLEX SCLEROSING LESION   BREAST LUMPECTOMY WITH NEEDLE LOCALIZATION Right 07/22/2018   Procedure: BREAST LUMPECTOMY WITH NEEDLE LOCALIZATION;  Surgeon: Bridget Ree, MD;  Location: [facility];  Service: General;  Laterality: Right;   CESAREAN SECTION  [id number]   CHOLECYSTECTOMY  12/2011   LASIK Bilateral 2008   TONSILLECTOMY  1960    Prior to Admission medications   Medication Sig Start Date End Date Taking? Authorizing Provider  co-enzyme Q-10 30 MG capsule Take 30 mg by mouth 3 (three) times daily.   Yes [provider]  ezetimibe (ZETIA) 10 MG tablet Take 1 tablet (10 mg total) by mouth daily. 11/29/20  Yes Chrismon, Vickki Muff, PA-C  fluticasone (FLONASE) 50 MCG/ACT nasal spray USE 2 SPRAYS IN EACH NOSTRIL AT BEDTIME 01/31/21  Yes Chrismon, Vickki Muff, PA-C  lisinopril-hydrochlorothiazide (ZESTORETIC) 20-12.5 MG tablet Take 1 tablet by mouth daily. 08/17/20  Yes Chrismon, Vickki Muff, PA-C  Multiple Vitamins-Minerals (CENTRUM SILVER ADULT 50+ PO) Take 1 tablet by mouth daily.   Yes [provider]  omeprazole (PRILOSEC) 20 MG capsule TAKE 1 CAPSULE EVERY DAY 01/31/21  Yes Chrismon, Vickki Muff,  PA-C  VITAMIN D PO Take by mouth.   Yes [provider]  Omega-3 Fatty Acids (FISH OIL PO) Take by mouth. Patient not taking: Reported on 01/31/2021    [provider]    Allergies as of 01/17/2021 - Review Complete 01/17/2021  Allergen Reaction Noted   Penicillins Itching and Other (See Comments) 03/23/2015   Sulfa antibiotics Itching 03/23/2015    Family History  Problem Relation Age of Onset   Colon cancer Father    Arthritis Sister    Hypertension Sister    Heart attack Maternal Grandfather    Heart attack Paternal Grandmother    Thyroid cancer Paternal Grandmother    Aneurysm Paternal Grandfather    Arthritis Mother    Hypertension Mother    Hyperlipidemia Mother    Breast cancer Cousin     Social History   Socioeconomic History   Marital status: Married    Spouse name: Not on file   Number of children: Not on file   Years of education: Not on file   Highest education level: Not on file  Occupational History   Not on file  Tobacco Use   Smoking status: Never   Smokeless tobacco: Never  Vaping Use   Vaping Use: Never used  Substance and Sexual Activity   Alcohol use: Yes    Alcohol/week: 0.0 standard drinks    Comment: OCCASIONALLY   Drug use: No   Sexual activity: Not on file  Other Topics  Concern   Not on file  Social History Narrative   Not on file   Social Determinants of Health   Financial Resource Strain: Not on file  Food Insecurity: Not on file  Transportation Needs: Not on file  Physical Activity: Not on file  Stress: Not on file  Social Connections: Not on file  Intimate Partner Violence: Not on file    Review of Systems: See HPI, otherwise negative ROS  Physical Exam: BP (!) 165/69   Pulse 83   Temp (!) 97.3 F (36.3 C) (Temporal)   Resp 18   Ht '5\' 5"'$  (1.651 m)   Wt 88 kg   SpO2 97%   BMI 32.28 kg/m  General:   Alert,  pleasant and cooperative in NAD Head:  Normocephalic and atraumatic. Neck:  Supple; no  masses or thyromegaly. Lungs:  Clear throughout to auscultation.    Heart:  Regular rate and rhythm. Abdomen:  Soft, nontender and nondistended. Normal bowel sounds, without guarding, and without rebound.   Neurologic:  Alert and  oriented x4;  grossly normal neurologically.  Impression/Plan: Bridget Green is here for an colonoscopy to be performed for famther with colon cancer at 58  Risks, benefits, limitations, and alternatives regarding  colonoscopy have been reviewed with the patient.  Questions have been answered.  All parties agreeable.   Bridget Lame, MD  02/10/2021, 9:32 AM

## 2021-02-10 NOTE — Op Note (Signed)
[facility] Patient Name: Bridget Green Procedure Date: 02/10/2021 9:54 AM MRN: [id number] Account #: [id number] Date of Birth: 1955/11/19 Admit Type: Outpatient Age: 65 Room: [facility] OR ROOM 01 Gender: Female Note Status: Finalized Procedure:             Colonoscopy Indications:           Screening for colorectal malignant neoplasm, Colon                         cancer screening in patient at increased risk: Family                         history of 1st-degree relative with colon polyps                         before age 3 years Providers:             Lucilla Lame MD, MD Referring MD:          Vickki Muff. Chrismon, MD (Referring MD) Medicines:             Propofol per Anesthesia Complications:         No immediate complications. Procedure:             Pre-Anesthesia Assessment:                        - Prior to the procedure, a History and Physical was                         performed, and patient medications and allergies were                         reviewed. The patient's tolerance of previous                         anesthesia was also reviewed. The risks and benefits                         of the procedure and the sedation options and risks                         were discussed with the patient. All questions were                         answered, and informed consent was obtained. Prior                         Anticoagulants: The patient has taken no previous                         anticoagulant or antiplatelet agents. ASA Grade                         Assessment: II - A patient with mild systemic disease.                         After reviewing the risks and benefits, the patient  was deemed in satisfactory condition to undergo the                         procedure.                        After obtaining informed consent, the colonoscope was                         passed under direct vision. Throughout the procedure,                          the patient's blood pressure, pulse, and oxygen                         saturations were monitored continuously. The                         Colonoscope was introduced through the anus and                         advanced to the the cecum, identified by appendiceal                         orifice and ileocecal valve. The colonoscopy was                         performed without difficulty. The patient tolerated                         the procedure well. The quality of the bowel                         preparation was excellent. Findings:      The perianal and digital rectal examinations were normal.      A 4 mm polyp was found in the ascending colon. The polyp was sessile.       The polyp was removed with a cold snare. Resection and retrieval were       complete.      A 4 mm polyp was found in the transverse colon. The polyp was sessile.       The polyp was removed with a cold snare. Resection and retrieval were       complete.      A 3 mm polyp was found in the sigmoid colon. The polyp was sessile. The       polyp was removed with a cold snare. Resection and retrieval were       complete.      Non-bleeding internal hemorrhoids were found during retroflexion. The       hemorrhoids were Grade I (internal hemorrhoids that do not prolapse). Impression:            - One 4 mm polyp in the ascending colon, removed with                         a cold snare. Resected and retrieved.                        - One 4 mm polyp in the transverse colon, removed with  a cold snare. Resected and retrieved.                        - One 3 mm polyp in the sigmoid colon, removed with a                         cold snare. Resected and retrieved.                        - Non-bleeding internal hemorrhoids. Recommendation:        - Discharge patient to home.                        - Resume previous diet.                        - Continue present medications.                         - Await pathology results.                        - Repeat colonoscopy in 5 years for surveillance if                         adenomatous otherwise 10 years.. Procedure Code(s):     --- Professional ---                        [id number], Colonoscopy, flexible; with removal of                         tumor(s), polyp(s), or other lesion(s) by snare                         technique Diagnosis Code(s):     --- Professional ---                        Z12.11, Encounter for screening for malignant neoplasm                         of colon                        Z83.71, Family history of colonic polyps                        K63.5, Polyp of colon CPT copyright 2019 American Medical Association. All rights reserved. The codes documented in this report are preliminary and upon coder review may  be revised to meet current compliance requirements. Lucilla Lame MD, MD 02/10/2021 10:25:39 AM This report has been signed electronically. Number of Addenda: 0 Note Initiated On: 02/10/2021 9:54 AM Scope Withdrawal Time: 0 hours 7 minutes 58 seconds  Total Procedure Duration: 0 hours 14 minutes 23 seconds  Estimated Blood Loss:  Estimated blood loss: none.      [facility]

## 2021-02-14 ENCOUNTER — Encounter: Payer: Self-pay | Admitting: Gastroenterology

## 2021-02-14 LAB — SURGICAL PATHOLOGY

## 2021-03-31 ENCOUNTER — Encounter: Payer: Self-pay | Admitting: Family Medicine

## 2021-04-26 ENCOUNTER — Telehealth: Payer: Self-pay

## 2021-04-26 NOTE — Telephone Encounter (Signed)
Copied from [place] [id number]. Topic: Appointment Scheduling - Scheduling Inquiry for Clinic >> Apr 26, 2021 11:46 AM Greggory Keen D wrote: Reason for CRM: Pt called to check on her appt with Simona Huh on the 27th and it was cancelled because he retired but no one called her back.  She needs to resch this appt with someone. CB#  [phone]

## 2021-05-05 ENCOUNTER — Ambulatory Visit: Payer: Medicare HMO | Admitting: Family Medicine

## 2021-05-05 ENCOUNTER — Other Ambulatory Visit: Payer: Self-pay

## 2021-05-05 ENCOUNTER — Ambulatory Visit (INDEPENDENT_AMBULATORY_CARE_PROVIDER_SITE_OTHER): Payer: Medicare HMO | Admitting: Physician Assistant

## 2021-05-05 ENCOUNTER — Encounter: Payer: Self-pay | Admitting: Physician Assistant

## 2021-05-05 VITALS — BP 139/80 | HR 63 | Temp 97.8°F | Resp 16 | Wt 197.6 lb

## 2021-05-05 DIAGNOSIS — I10 Essential (primary) hypertension: Secondary | ICD-10-CM | POA: Diagnosis not present

## 2021-05-05 DIAGNOSIS — Z23 Encounter for immunization: Secondary | ICD-10-CM | POA: Diagnosis not present

## 2021-05-05 DIAGNOSIS — E782 Mixed hyperlipidemia: Secondary | ICD-10-CM | POA: Diagnosis not present

## 2021-05-05 DIAGNOSIS — E7800 Pure hypercholesterolemia, unspecified: Secondary | ICD-10-CM | POA: Diagnosis not present

## 2021-05-05 NOTE — Progress Notes (Signed)
Established patient visit   [name]   DOB: 1955-10-07   65 y.o. Female  MRN: [id number] Visit Date: 05/05/2021  Today's healthcare provider: Mikey Kirschner, PA-C   Chief Complaint  Patient presents with   Hypertension   Hyperlipidemia   Subjective    HPI  Hypertension, follow-up  BP Readings from Last 3 Encounters:  05/05/21 139/80  02/10/21 (!) 141/68  11/29/20 (!) 152/57   Wt Readings from Last 3 Encounters:  05/05/21 197 lb 9.6 oz (89.6 kg)  02/10/21 194 lb (88 kg)  11/29/20 200 lb (90.7 kg)     She was last seen for hypertension 5 months ago.  BP at that visit was 152/57. Management since that visit includes labs, stay on medication.  She reports excellent compliance with treatment. She takes her BP at home, reports ranges high 120s-130s/80s. She is not having side effects.  She is following a Regular diet. She is exercising--enjoys walking on the track, is in a group exercise class. She does not smoke   Outside blood pressures are not being checked. Symptoms: No chest pain No chest pressure  No palpitations No syncope  No dyspnea No orthopnea  No paroxysmal nocturnal dyspnea Yes lower extremity edema   Pertinent labs: Lab Results  Component Value Date   CHOL 202 (H) 11/29/2020   HDL 64 11/29/2020   LDLCALC 111 (H) 11/29/2020   TRIG 156 (H) 11/29/2020   CHOLHDL 3.2 11/29/2020   Lab Results  Component Value Date   NA 142 11/29/2020   K 4.4 11/29/2020   CREATININE 0.82 11/29/2020   EGFR 79 11/29/2020   GLUCOSE 98 11/29/2020   TSH 0.600 11/29/2020     The 10-year ASCVD risk score (Arnett DK, et al., 2019) is: 10.3%   ---------------------------------------------------------------------------------------------------  Lipid/Cholesterol, Follow-up  Last lipid panel Other pertinent labs  Lab Results  Component Value Date   CHOL 202 (H) 11/29/2020   HDL 64 11/29/2020   LDLCALC 111 (H) 11/29/2020   TRIG 156 (H) 11/29/2020    CHOLHDL 3.2 11/29/2020   Lab Results  Component Value Date   ALT 30 11/29/2020   AST 26 11/29/2020   PLT 239 11/29/2020   TSH 0.600 11/29/2020     She was last seen for this 5 months ago.  Management since that visit includes continue medication and diet changes.  She reports excellent compliance with treatment. She is not having side effects.   Symptoms: No chest pain No chest pressure/discomfort  No dyspnea Yes lower extremity edema  No numbness or tingling of extremity No orthopnea  No palpitations No paroxysmal nocturnal dyspnea  No speech difficulty No syncope   Current diet: well balanced Current exercise: cardiovascular workout on exercise equipment  The 10-year ASCVD risk score (Arnett DK, et al., 2019) is: 10.3%  ---------------------------------------------------------------------------------------------------     Medications: Outpatient Medications Prior to Visit  Medication Sig   co-enzyme Q-10 30 MG capsule Take 30 mg by mouth 3 (three) times daily.   ezetimibe (ZETIA) 10 MG tablet Take 1 tablet (10 mg total) by mouth daily.   fluticasone (FLONASE) 50 MCG/ACT nasal spray USE 2 SPRAYS IN EACH NOSTRIL AT BEDTIME   lisinopril-hydrochlorothiazide (ZESTORETIC) 20-12.5 MG tablet Take 1 tablet by mouth daily.   Multiple Vitamins-Minerals (CENTRUM SILVER ADULT 50+ PO) Take 1 tablet by mouth daily.   Omega-3 Fatty Acids (FISH OIL PO) Take by mouth.   omeprazole (PRILOSEC) 20 MG capsule TAKE 1 CAPSULE EVERY DAY  VITAMIN D PO Take by mouth.   No facility-administered medications prior to visit.    Review of Systems  All other systems reviewed and are negative.     Objective    BP 139/80   Pulse 63   Temp 97.8 F (36.6 C) (Temporal)   Resp 16   Wt 197 lb 9.6 oz (89.6 kg)   SpO2 99%   BMI 32.88 kg/m    Physical Exam Constitutional:      General: She is awake.     Appearance: She is well-developed.  HENT:     Head: Normocephalic.  Eyes:      Conjunctiva/sclera: Conjunctivae normal.  Cardiovascular:     Rate and Rhythm: Normal rate and regular rhythm.     Heart sounds: Normal heart sounds.  Pulmonary:     Effort: Pulmonary effort is normal.     Breath sounds: Normal breath sounds.  Skin:    General: Skin is warm.  Neurological:     Mental Status: She is alert and oriented to person, place, and time.  Psychiatric:        Attention and Perception: Attention normal.        Mood and Affect: Mood normal.        Speech: Speech normal.        Behavior: Behavior is cooperative.      No results found for any visits on 05/05/21.  Assessment & Plan     Problem List Items Addressed This Visit       Cardiovascular and Mediastinum   Essential (primary) hypertension - Primary    Continue medication, stable and well controlled. Continue with exercise plan and be mindful about diet. F/u in around 6 mo CPE at this time.      Relevant Orders   Comprehensive Metabolic Panel (CMET)     Other   Mixed hyperlipidemia    Pt consistent with balanced diet and exercise. Will recheck lipids to assess stability Discussed potentially needed to add a statin.  Will f/u with labs      Relevant Orders   Lipid panel   Comprehensive Metabolic Panel (CMET)   Other Visit Diagnoses     Need for influenza vaccination       Relevant Orders   Flu Vaccine QUAD High Dose(Fluad) (Completed)        Return in about 7 months (around 11/29/2021) for CPE.      I, Mikey Kirschner, PA-C have reviewed all documentation for this visit. The documentation on  05/05/2021 for the exam, diagnosis, procedures, and orders are all accurate and complete.    Mikey Kirschner, PA-C  [facility] [phone] (phone) [phone] (fax)  [place]

## 2021-05-05 NOTE — Assessment & Plan Note (Signed)
Pt consistent with balanced diet and exercise. Will recheck lipids to assess stability Discussed potentially needed to add a statin.  Will f/u with labs

## 2021-05-05 NOTE — Assessment & Plan Note (Signed)
Continue medication, stable and well controlled. Continue with exercise plan and be mindful about diet. F/u in around 6 mo CPE at this time.

## 2021-05-06 LAB — COMPREHENSIVE METABOLIC PANEL
ALT: 28 IU/L (ref 0–32)
AST: 24 IU/L (ref 0–40)
Albumin/Globulin Ratio: 1.8 (ref 1.2–2.2)
Albumin: 4.5 g/dL (ref 3.8–4.8)
Alkaline Phosphatase: 98 IU/L (ref 44–121)
BUN/Creatinine Ratio: 19 (ref 12–28)
BUN: 16 mg/dL (ref 8–27)
Bilirubin Total: 0.5 mg/dL (ref 0.0–1.2)
CO2: 20 mmol/L (ref 20–29)
Calcium: 9.7 mg/dL (ref 8.7–10.3)
Chloride: 104 mmol/L (ref 96–106)
Creatinine, Ser: 0.84 mg/dL (ref 0.57–1.00)
Globulin, Total: 2.5 g/dL (ref 1.5–4.5)
Glucose: 104 mg/dL — ABNORMAL HIGH (ref 70–99)
Potassium: 4.4 mmol/L (ref 3.5–5.2)
Sodium: 141 mmol/L (ref 134–144)
Total Protein: 7 g/dL (ref 6.0–8.5)
eGFR: 76 mL/min/{1.73_m2} (ref >59)

## 2021-05-06 LAB — LIPID PANEL
Chol/HDL Ratio: 3.2 ratio (ref 0.0–4.4)
Cholesterol, Total: 211 mg/dL — ABNORMAL HIGH (ref 100–199)
HDL: 66 mg/dL (ref >39)
LDL Chol Calc (NIH): 126 mg/dL — ABNORMAL HIGH (ref 0–99)
Triglycerides: 110 mg/dL (ref 0–149)
VLDL Cholesterol Cal: 19 mg/dL (ref 5–40)

## 2021-05-10 ENCOUNTER — Other Ambulatory Visit: Payer: Self-pay | Admitting: Physician Assistant

## 2021-05-10 ENCOUNTER — Other Ambulatory Visit: Payer: Self-pay

## 2021-05-10 DIAGNOSIS — E7841 Elevated Lipoprotein(a): Secondary | ICD-10-CM

## 2021-05-10 MED ORDER — ATORVASTATIN CALCIUM 20 MG PO TABS: 20.0000 mg | ORAL_TABLET | Freq: Every day | ORAL | 3 refills | Status: DC

## 2021-06-27 ENCOUNTER — Other Ambulatory Visit: Payer: Self-pay

## 2021-06-27 ENCOUNTER — Telehealth: Payer: Self-pay | Admitting: Physician Assistant

## 2021-06-27 ENCOUNTER — Encounter: Payer: Self-pay | Admitting: Physician Assistant

## 2021-06-27 ENCOUNTER — Ambulatory Visit (INDEPENDENT_AMBULATORY_CARE_PROVIDER_SITE_OTHER): Payer: Medicare HMO | Admitting: Physician Assistant

## 2021-06-27 VITALS — BP 147/55 | HR 67 | Ht 64.0 in | Wt 197.8 lb

## 2021-06-27 DIAGNOSIS — I10 Essential (primary) hypertension: Secondary | ICD-10-CM

## 2021-06-27 DIAGNOSIS — J309 Allergic rhinitis, unspecified: Secondary | ICD-10-CM

## 2021-06-27 DIAGNOSIS — E7841 Elevated Lipoprotein(a): Secondary | ICD-10-CM | POA: Diagnosis not present

## 2021-06-27 MED ORDER — FLUTICASONE PROPIONATE 50 MCG/ACT NA SUSP: NASAL | 0 refills | Status: DC

## 2021-06-27 MED ORDER — LISINOPRIL-HYDROCHLOROTHIAZIDE 20-12.5 MG PO TABS
1.0000 | ORAL_TABLET | Freq: Every day | ORAL | 1 refills | Status: DC
Start: 2021-06-27 — End: 2021-10-03

## 2021-06-27 NOTE — Assessment & Plan Note (Signed)
Based on last lipid panel, we had started Atorvastatin 20 mg.  Pt is tolerating very well Will recheck CMP and lipid panel today

## 2021-06-27 NOTE — Telephone Encounter (Signed)
[facility] faxed refill request for the following medications:  fluticasone (FLONASE) 50 MCG/ACT nasal spray   lisinopril-hydrochlorothiazide (ZESTORETIC) 20-12.5 MG tablet   Please advise.

## 2021-06-27 NOTE — Progress Notes (Signed)
Established patient visit   [name]   DOB: 1955-10-06   65 y.o. Female  MRN: [id number] Visit Date: 06/27/2021  Today's healthcare provider: Mikey Kirschner, PA-C   Chief Complaint  Patient presents with   Hyperlipidemia   Subjective    HPI  Lipid/Cholesterol, Follow-up  Last lipid panel Other pertinent labs  Lab Results  Component Value Date   CHOL 211 (H) 05/05/2021   HDL 66 05/05/2021   LDLCALC 126 (H) 05/05/2021   TRIG 110 05/05/2021   CHOLHDL 3.2 05/05/2021   Lab Results  Component Value Date   ALT 28 05/05/2021   AST 24 05/05/2021   PLT 239 11/29/2020   TSH 0.600 11/29/2020     She was last seen for this 7 weeks ago.  Management since that visit includes start atorvastatin 20 mg.  She reports excellent compliance with treatment. She is not having side effects.   Symptoms: No chest pain No chest pressure/discomfort  No dyspnea No lower extremity edema  No numbness or tingling of extremity No orthopnea  No palpitations No paroxysmal nocturnal dyspnea  No speech difficulty No syncope   Current diet: well balanced Current exercise: walking and silver sneakers  The 10-year ASCVD risk score (Arnett DK, et al., 2019) is: 11.6%  ---------------------------------------------------------------------------------------------------     Medications: Outpatient Medications Prior to Visit  Medication Sig   atorvastatin (LIPITOR) 20 MG tablet Take 1 tablet (20 mg total) by mouth daily.   co-enzyme Q-10 30 MG capsule Take 30 mg by mouth 3 (three) times daily.   ezetimibe (ZETIA) 10 MG tablet Take 1 tablet (10 mg total) by mouth daily.   fluticasone (FLONASE) 50 MCG/ACT nasal spray USE 2 SPRAYS IN EACH NOSTRIL AT BEDTIME   lisinopril-hydrochlorothiazide (ZESTORETIC) 20-12.5 MG tablet Take 1 tablet by mouth daily.   Multiple Vitamins-Minerals (CENTRUM SILVER ADULT 50+ PO) Take 1 tablet by mouth daily.   Omega-3 Fatty Acids (FISH OIL PO) Take  by mouth.   omeprazole (PRILOSEC) 20 MG capsule TAKE 1 CAPSULE EVERY DAY   VITAMIN D PO Take by mouth.   No facility-administered medications prior to visit.    Review of Systems  Constitutional:  Negative for fever.  Respiratory:  Negative for cough and shortness of breath.   Cardiovascular:  Negative for chest pain.  Musculoskeletal:  Negative for myalgias.   Last CBC Lab Results  Component Value Date   WBC 6.6 11/29/2020   HGB 14.4 11/29/2020   HCT 43.3 11/29/2020   MCV 90 11/29/2020   MCH 30.1 11/29/2020   RDW 12.8 11/29/2020   PLT 239 31/54/0086   Last metabolic panel Lab Results  Component Value Date   GLUCOSE 104 (H) 05/05/2021   NA 141 05/05/2021   K 4.4 05/05/2021   CL 104 05/05/2021   CO2 20 05/05/2021   BUN 16 05/05/2021   CREATININE 0.84 05/05/2021   EGFR 76 05/05/2021   CALCIUM 9.7 05/05/2021   PROT 7.0 05/05/2021   ALBUMIN 4.5 05/05/2021   LABGLOB 2.5 05/05/2021   AGRATIO 1.8 05/05/2021   BILITOT 0.5 05/05/2021   ALKPHOS 98 05/05/2021   AST 24 05/05/2021   ALT 28 05/05/2021   ANIONGAP 5 (L) 12/07/2011   Last lipids Lab Results  Component Value Date   CHOL 211 (H) 05/05/2021   HDL 66 05/05/2021   LDLCALC 126 (H) 05/05/2021   TRIG 110 05/05/2021   CHOLHDL 3.2 05/05/2021   Last hemoglobin A1c Lab Results  Component Value Date   HGBA1C 6.0 (H) 08/24/2017   Last thyroid functions Lab Results  Component Value Date   TSH 0.600 11/29/2020   Last vitamin D Lab Results  Component Value Date   VD25OH 35.6 11/29/2020       Objective    BP (!) 147/55 (BP Location: Left Arm, Patient Position: Sitting, Cuff Size: Large)    Pulse 67    Ht _0  (1.626 m)    Wt 197 lb 12.8 oz (89.7 kg)    SpO2 100%    BMI 33.95 kg/m  BP Readings from Last 3 Encounters:  06/27/21 (!) 147/55  05/05/21 139/80  02/10/21 (!) 141/68   Wt Readings from Last 3 Encounters:  06/27/21 197 lb 12.8 oz (89.7 kg)  05/05/21 197 lb 9.6 oz (89.6 kg)  02/10/21 194 lb (88  kg)      Physical Exam Constitutional:      General: She is awake.     Appearance: She is well-developed.  HENT:     Head: Normocephalic.  Eyes:     Conjunctiva/sclera: Conjunctivae normal.  Cardiovascular:     Rate and Rhythm: Normal rate and regular rhythm.     Heart sounds: Normal heart sounds.  Pulmonary:     Effort: Pulmonary effort is normal.     Breath sounds: Normal breath sounds.  Skin:    General: Skin is warm.  Neurological:     Mental Status: She is alert and oriented to person, place, and time.  Psychiatric:        Attention and Perception: Attention normal.        Mood and Affect: Mood normal.        Speech: Speech normal.        Behavior: Behavior is cooperative.      No results found for any visits on 06/27/21.  Assessment & Plan     Problem List Items Addressed This Visit       Other   Elevated lipoprotein(a) - Primary    Based on last lipid panel, we had started Atorvastatin 20 mg.  Pt is tolerating very well Will recheck CMP and lipid panel today      Relevant Orders   Lipid Profile   Comprehensive Metabolic Panel (CMET)     Return as scheduled.      I, Mikey Kirschner, PA-C have reviewed all documentation for this visit. The documentation on  06/27/2021 for the exam, diagnosis, procedures, and orders are all accurate and complete.    Mikey Kirschner, PA-C  [facility] [phone] (phone) [phone] (fax)  [place]

## 2021-06-28 LAB — COMPREHENSIVE METABOLIC PANEL
ALT: 24 IU/L (ref 0–32)
AST: 22 IU/L (ref 0–40)
Albumin/Globulin Ratio: 1.8 (ref 1.2–2.2)
Albumin: 4.4 g/dL (ref 3.8–4.8)
Alkaline Phosphatase: 111 IU/L (ref 44–121)
BUN/Creatinine Ratio: 15 (ref 12–28)
BUN: 13 mg/dL (ref 8–27)
Bilirubin Total: 0.6 mg/dL (ref 0.0–1.2)
CO2: 20 mmol/L (ref 20–29)
Calcium: 9.5 mg/dL (ref 8.7–10.3)
Chloride: 107 mmol/L — ABNORMAL HIGH (ref 96–106)
Creatinine, Ser: 0.86 mg/dL (ref 0.57–1.00)
Globulin, Total: 2.4 g/dL (ref 1.5–4.5)
Glucose: 114 mg/dL — ABNORMAL HIGH (ref 70–99)
Potassium: 4 mmol/L (ref 3.5–5.2)
Sodium: 143 mmol/L (ref 134–144)
Total Protein: 6.8 g/dL (ref 6.0–8.5)
eGFR: 74 mL/min/{1.73_m2} (ref >59)

## 2021-06-28 LAB — LIPID PANEL
Chol/HDL Ratio: 2.3 ratio (ref 0.0–4.4)
Cholesterol, Total: 145 mg/dL (ref 100–199)
HDL: 64 mg/dL (ref >39)
LDL Chol Calc (NIH): 64 mg/dL (ref 0–99)
Triglycerides: 90 mg/dL (ref 0–149)
VLDL Cholesterol Cal: 17 mg/dL (ref 5–40)

## 2021-07-27 ENCOUNTER — Other Ambulatory Visit: Payer: Self-pay | Admitting: Physician Assistant

## 2021-07-27 DIAGNOSIS — Z1231 Encounter for screening mammogram for malignant neoplasm of breast: Secondary | ICD-10-CM

## 2021-08-09 ENCOUNTER — Telehealth: Payer: Self-pay | Admitting: Physician Assistant

## 2021-08-09 NOTE — Telephone Encounter (Signed)
Copied from [place] [id number]. Topic: Medicare AWV >> Aug 09, 2021  3:24 PM Cher Nakai R wrote: Reason for CRM:  Left message for patient to call back and schedule Medicare Annual Wellness Visit (AWV) in office.   If not able to come in office, please offer to do virtually or by telephone.   Last AWV: 11/28/2019  Please schedule at anytime with [facility] Health Advisor.  If any questions, please contact me at [phone]

## 2021-08-16 ENCOUNTER — Ambulatory Visit (INDEPENDENT_AMBULATORY_CARE_PROVIDER_SITE_OTHER): Payer: Medicare HMO

## 2021-08-16 DIAGNOSIS — Z0000 Encounter for general adult medical examination without abnormal findings: Secondary | ICD-10-CM | POA: Diagnosis not present

## 2021-08-16 NOTE — Patient Instructions (Signed)
Bridget Green , Thank you for taking time to come for your Medicare Wellness Visit. I appreciate your ongoing commitment to your health goals. Please review the following plan we discussed and let me know if I can assist you in the future.   Screening recommendations/referrals: Colonoscopy: 02/10/21 Mammogram: has appointment on 08/30/21 Bone Density: 07/14/20 Recommended yearly ophthalmology/optometry visit for glaucoma screening and checkup Recommended yearly dental visit for hygiene and checkup  Vaccinations: Influenza vaccine: 05/05/21 Pneumococcal vaccine: 04/29/20 Tdap vaccine: 03/26/15 Shingles vaccine: Zostavax 11/27/14  Shingrix 04/28/19, 08/25/19   Covid-19:10/07/19, 10/28/19  Advanced directives: no  Conditions/risks identified: none  Next appointment: Follow up in one year for your annual wellness visit    Preventive Care 6 Years and Older, Female Preventive care refers to lifestyle choices and visits with your health care provider that can promote health and wellness. What does preventive care include? A yearly physical exam. This is also called an annual well check. Dental exams once or twice a year. Routine eye exams. Ask your health care provider how often you should have your eyes checked. Personal lifestyle choices, including: Daily care of your teeth and gums. Regular physical activity. Eating a healthy diet. Avoiding tobacco and drug use. Limiting alcohol use. Practicing safe sex. Taking low-dose aspirin every day. Taking vitamin and mineral supplements as recommended by your health care provider. What happens during an annual well check? The services and screenings done by your health care provider during your annual well check will depend on your age, overall health, lifestyle risk factors, and family history of disease. Counseling  Your health care provider may ask you questions about your: Alcohol use. Tobacco use. Drug use. Emotional well-being. Home and  relationship well-being. Sexual activity. Eating habits. History of falls. Memory and ability to understand (cognition). Work and work Statistician. Reproductive health. Screening  You may have the following tests or measurements: Height, weight, and BMI. Blood pressure. Lipid and cholesterol levels. These may be checked every 5 years, or more frequently if you are over 42 years old. Skin check. Lung cancer screening. You may have this screening every year starting at age 38 if you have a 30-pack-year history of smoking and currently smoke or have quit within the past 15 years. Fecal occult blood test (FOBT) of the stool. You may have this test every year starting at age 4. Flexible sigmoidoscopy or colonoscopy. You may have a sigmoidoscopy every 5 years or a colonoscopy every 10 years starting at age 46. Hepatitis C blood test. Hepatitis B blood test. Sexually transmitted disease (STD) testing. Diabetes screening. This is done by checking your blood sugar (glucose) after you have not eaten for a while (fasting). You may have this done every 1-3 years. Bone density scan. This is done to screen for osteoporosis. You may have this done starting at age 55. Mammogram. This may be done every 1-2 years. Talk to your health care provider about how often you should have regular mammograms. Talk with your health care provider about your test results, treatment options, and if necessary, the need for more tests. Vaccines  Your health care provider may recommend certain vaccines, such as: Influenza vaccine. This is recommended every year. Tetanus, diphtheria, and acellular pertussis (Tdap, Td) vaccine. You may need a Td booster every 10 years. Zoster vaccine. You may need this after age 60. Pneumococcal 13-valent conjugate (PCV13) vaccine. One dose is recommended after age 45. Pneumococcal polysaccharide (PPSV23) vaccine. One dose is recommended after age 31. Talk to your  health care provider  about which screenings and vaccines you need and how often you need them. This information is not intended to replace advice given to you by your health care provider. Make sure you discuss any questions you have with your health care provider. Document Released: 07/23/2015 Document Revised: 03/15/2016 Document Reviewed: 04/27/2015 Elsevier Interactive Patient Education  2017 [place] Prevention in the Home Falls can cause injuries. They can happen to people of all ages. There are many things you can do to make your home safe and to help prevent falls. What can I do on the outside of my home? Regularly fix the edges of walkways and driveways and fix any cracks. Remove anything that might make you trip as you walk through a door, such as a raised step or threshold. Trim any bushes or trees on the path to your home. Use bright outdoor lighting. Clear any walking paths of anything that might make someone trip, such as rocks or tools. Regularly check to see if handrails are loose or broken. Make sure that both sides of any steps have handrails. Any raised decks and porches should have guardrails on the edges. Have any leaves, snow, or ice cleared regularly. Use sand or salt on walking paths during winter. Clean up any spills in your garage right away. This includes oil or grease spills. What can I do in the bathroom? Use night lights. Install grab bars by the toilet and in the tub and shower. Do not use towel bars as grab bars. Use non-skid mats or decals in the tub or shower. If you need to sit down in the shower, use a plastic, non-slip stool. Keep the floor dry. Clean up any water that spills on the floor as soon as it happens. Remove soap buildup in the tub or shower regularly. Attach bath mats securely with double-sided non-slip rug tape. Do not have throw rugs and other things on the floor that can make you trip. What can I do in the bedroom? Use night lights. Make sure  that you have a light by your bed that is easy to reach. Do not use any sheets or blankets that are too big for your bed. They should not hang down onto the floor. Have a firm chair that has side arms. You can use this for support while you get dressed. Do not have throw rugs and other things on the floor that can make you trip. What can I do in the kitchen? Clean up any spills right away. Avoid walking on wet floors. Keep items that you use a lot in easy-to-reach places. If you need to reach something above you, use a strong step stool that has a grab bar. Keep electrical cords out of the way. Do not use floor polish or wax that makes floors slippery. If you must use wax, use non-skid floor wax. Do not have throw rugs and other things on the floor that can make you trip. What can I do with my stairs? Do not leave any items on the stairs. Make sure that there are handrails on both sides of the stairs and use them. Fix handrails that are broken or loose. Make sure that handrails are as long as the stairways. Check any carpeting to make sure that it is firmly attached to the stairs. Fix any carpet that is loose or worn. Avoid having throw rugs at the top or bottom of the stairs. If you do have throw rugs, attach them  to the floor with carpet tape. Make sure that you have a light switch at the top of the stairs and the bottom of the stairs. If you do not have them, ask someone to add them for you. What else can I do to help prevent falls? Wear shoes that: Do not have high heels. Have rubber bottoms. Are comfortable and fit you well. Are closed at the toe. Do not wear sandals. If you use a stepladder: Make sure that it is fully opened. Do not climb a closed stepladder. Make sure that both sides of the stepladder are locked into place. Ask someone to hold it for you, if possible. Clearly mark and make sure that you can see: Any grab bars or handrails. First and last steps. Where the edge of  each step is. Use tools that help you move around (mobility aids) if they are needed. These include: Canes. Walkers. Scooters. Crutches. Turn on the lights when you go into a dark area. Replace any light bulbs as soon as they burn out. Set up your furniture so you have a clear path. Avoid moving your furniture around. If any of your floors are uneven, fix them. If there are any pets around you, be aware of where they are. Review your medicines with your doctor. Some medicines can make you feel dizzy. This can increase your chance of falling. Ask your doctor what other things that you can do to help prevent falls. This information is not intended to replace advice given to you by your health care provider. Make sure you discuss any questions you have with your health care provider. Document Released: 04/22/2009 Document Revised: 12/02/2015 Document Reviewed: 07/31/2014 Elsevier Interactive Patient Education  2017 [employer].

## 2021-08-16 NOTE — Progress Notes (Signed)
Virtual Visit via Telephone Note  I connected with  Bridget Green on 08/16/21 at  3:40 PM EST by telephone and verified that I am speaking with the correct person using two identifiers.  Location: Patient: home Provider: [facility] Persons participating in the virtual visit: [place]   I discussed the limitations, risks, security and privacy concerns of performing an evaluation and management service by telephone and the availability of in person appointments. The patient expressed understanding and agreed to proceed.  Interactive audio and video telecommunications were attempted between this nurse and patient, however failed, due to patient having technical difficulties OR patient did not have access to video capability.  We continued and completed visit with audio only.  Some vital signs may be absent or patient reported.   Dionisio David, LPN  Subjective:   Bridget Green is a 66 y.o. female who presents for Medicare Annual (Subsequent) preventive examination.  Review of Systems           Objective:    There were no vitals filed for this visit. There is no height or weight on file to calculate BMI.  Advanced Directives 02/10/2021 07/19/2018  Does Patient Have a Medical Advance Directive? No No  Type of Advance Directive Healthcare Power of Attorney -  Would patient like information on creating a medical advance directive? No - Patient declined -    Current Medications (verified) Outpatient Encounter Medications as of 08/16/2021  Medication Sig   atorvastatin (LIPITOR) 20 MG tablet Take 1 tablet (20 mg total) by mouth daily.   co-enzyme Q-10 30 MG capsule Take 30 mg by mouth 3 (three) times daily.   ezetimibe (ZETIA) 10 MG tablet Take 1 tablet (10 mg total) by mouth daily.   fluticasone (FLONASE) 50 MCG/ACT nasal spray USE 2 SPRAYS IN EACH NOSTRIL AT BEDTIMEUSE 2 SPRAYS IN EACH NOSTRIL AT BEDTIME   lisinopril-hydrochlorothiazide (ZESTORETIC) 20-12.5 MG  tablet Take 1 tablet by mouth daily.   Multiple Vitamins-Minerals (CENTRUM SILVER ADULT 50+ PO) Take 1 tablet by mouth daily.   omeprazole (PRILOSEC) 20 MG capsule TAKE 1 CAPSULE EVERY DAY   VITAMIN D PO Take by mouth.   [DISCONTINUED] Omega-3 Fatty Acids (FISH OIL PO) Take by mouth.   No facility-administered encounter medications on file as of 08/16/2021.    Allergies (verified) Penicillins and Sulfa antibiotics   History: Past Medical History:  Diagnosis Date   Arthritis    Family history of adverse reaction to anesthesia    DAD-HARD TO WAKE UP   GERD (gastroesophageal reflux disease)    History of hiatal hernia    Hyperlipidemia    Hypertension    Past Surgical History:  Procedure Laterality Date   ADENOIDECTOMY  1960   APPENDECTOMY  1983   BREAST BIOPSY Right 06/20/2018   stereo for distortion  SMALL COMPLEX SCLEROSING LESION   BREAST EXCISIONAL BIOPSY Right 07/22/2018   NP for  SMALL COMPLEX SCLEROSING LESION   BREAST LUMPECTOMY WITH NEEDLE LOCALIZATION Right 07/22/2018   Procedure: BREAST LUMPECTOMY WITH NEEDLE LOCALIZATION;  Surgeon: Olean Ree, MD;  Location: [facility];  Service: General;  Laterality: Right;   CESAREAN SECTION  [id number]   CHOLECYSTECTOMY  12/2011   COLONOSCOPY WITH PROPOFOL N/A 02/10/2021   Procedure: COLONOSCOPY WITH PROPOFOL;  Surgeon: Lucilla Lame, MD;  Location: [place];  Service: Endoscopy;  Laterality: N/A;   LASIK Bilateral 2008   POLYPECTOMY  02/10/2021   Procedure: POLYPECTOMY;  Surgeon: Lucilla Lame, MD;  Location:  [place];  Service: Endoscopy;;   TONSILLECTOMY  1960   Family History  Problem Relation Age of Onset   Colon cancer Father    Arthritis Sister    Hypertension Sister    Heart attack Maternal Grandfather    Heart attack Paternal Grandmother    Thyroid cancer Paternal Grandmother    Aneurysm Paternal Grandfather    Arthritis Mother    Hypertension Mother    Hyperlipidemia Mother    Breast cancer  Cousin    Social History   Socioeconomic History   Marital status: Married    Spouse name: Not on file   Number of children: Not on file   Years of education: Not on file   Highest education level: Not on file  Occupational History   Not on file  Tobacco Use   Smoking status: Never   Smokeless tobacco: Never  Vaping Use   Vaping Use: Never used  Substance and Sexual Activity   Alcohol use: Yes    Alcohol/week: 0.0 standard drinks    Comment: OCCASIONALLY   Drug use: No   Sexual activity: Not on file  Other Topics Concern   Not on file  Social History Narrative   Not on file   Social Determinants of Health   Financial Resource Strain: Not on file  Food Insecurity: Not on file  Transportation Needs: Not on file  Physical Activity: Not on file  Stress: Not on file  Social Connections: Not on file    Tobacco Counseling Counseling given: Not Answered   Clinical Intake:  Pre-visit preparation completed: Yes  Pain : No/denies pain     Nutritional Risks: None Diabetes: No  How often do you need to have someone help you when you read instructions, pamphlets, or other written materials from your doctor or pharmacy?: 1 - Never  Diabetic?no  Interpreter Needed?: No  Information entered by :: Kirke Shaggy, LPN   Activities of Daily Living In your present state of health, do you have any difficulty performing the following activities: 08/13/2021 02/10/2021  Hearing? Y N  Vision? N N  Difficulty concentrating or making decisions? N N  Walking or climbing stairs? N N  Dressing or bathing? N N  Doing errands, shopping? N -  Preparing Food and eating ? N -  Using the Toilet? N -  In the past six months, have you accidently leaked urine? Y -  Do you have problems with loss of bowel control? N -  Managing your Medications? N -  Managing your Finances? N -  Housekeeping or managing your Housekeeping? N -  Some recent data might be hidden    Patient Care  Team: Mikey Kirschner, PA-C as PCP - General (Physician Assistant)  Indicate any recent Medical Services you may have received from other than [facility] providers in the past year (date may be approximate).     Assessment:   This is a routine wellness examination for Bridget Green.  Hearing/Vision screen No results found.  Dietary issues and exercise activities discussed:     Goals Addressed   None    Depression Screen PHQ 2/9 Scores 11/29/2020 11/28/2019 08/19/2018 08/24/2017 08/19/2015  PHQ - 2 Score 0 0 0 0 0  PHQ- 9 Score - 2 1 0 -    Fall Risk Fall Risk  08/13/2021 11/29/2020 11/28/2019 08/25/2019 08/06/2018  Falls in the past year? 1 0 0 0 0  Number falls in past yr: 0 0 0 - -  Injury with  Fall? 1 0 0 - -  Follow up - - Falls evaluation completed - -    FALL RISK PREVENTION PERTAINING TO THE HOME:  Any stairs in or around the home? Yes  If so, are there any without handrails? No  Home free of loose throw rugs in walkways, pet beds, electrical cords, etc? Yes  Adequate lighting in your home to reduce risk of falls? Yes   ASSISTIVE DEVICES UTILIZED TO PREVENT FALLS:  Life alert? No  Use of a cane, walker or w/c? No  Grab bars in the bathroom? No  Shower chair or bench in shower? No  Elevated toilet seat or a handicapped toilet? Yes   Cognitive Function:Normal cognitive status assessed by direct observation by this Nurse Health Advisor. No abnormalities found.          Immunizations Immunization History  Administered Date(s) Administered   Fluad Quad(high Dose 65+) 04/29/2020, 05/05/2021   Influenza,inj,Quad PF,6+ Mos 04/24/2017, 03/10/2019   Influenza-Unspecified 05/10/2017, 04/09/2018   PFIZER(Purple Top)SARS-COV-2 Vaccination 10/07/2019, 10/28/2019   Pneumococcal Conjugate-13 04/28/2019   Pneumococcal Polysaccharide-23 04/29/2020   Tdap 03/26/2015   Zoster Recombinat (Shingrix) 04/28/2019, 08/25/2019   Zoster, Live 11/27/2014    TDAP status: Up to date  Flu Vaccine  status: Up to date  Pneumococcal vaccine status: Up to date  Covid-19 vaccine status: Completed vaccines  Qualifies for Shingles Vaccine? Yes   Zostavax completed Yes   Shingrix Completed?: Yes  Screening Tests Health Maintenance  Topic Date Due   COVID-19 Vaccine (3 - Booster for Pfizer series) 12/23/2019   MAMMOGRAM  07/14/2021   TETANUS/TDAP  03/25/2025   COLONOSCOPY (Pts 45-24yrs Insurance coverage will need to be confirmed)  02/10/2026   Pneumonia Vaccine 71+ Years old  Completed   INFLUENZA VACCINE  Completed   DEXA SCAN  Completed   Hepatitis C Screening  Completed   Zoster Vaccines- Shingrix  Completed   HPV VACCINES  Aged Out    Health Maintenance  Health Maintenance Due  Topic Date Due   COVID-19 Vaccine (3 - Booster for [place] series) 12/23/2019   MAMMOGRAM  07/14/2021    Colorectal cancer screening: Type of screening: Colonoscopy. Completed 02/10/21. Repeat every 5 years  Mammogram status: Completed 07/22/20. Repeat every year- has appointment in 2 weeks  Bone Density status: Completed 07/14/20. Results reflect: Bone density results: NORMAL. Repeat every 5 years.  Lung Cancer Screening: (Low Dose CT Chest recommended if Age 35-80 years, 30 pack-year currently smoking OR have quit w/in 15years.) does not qualify.    Additional Screening:  Hepatitis C Screening: does qualify; Completed 04/26/12  Vision Screening: Recommended annual ophthalmology exams for early detection of glaucoma and other disorders of the eye. Is the patient up to date with their annual eye exam?  Yes  Who is the provider or what is the name of the office in which the patient attends annual eye exams? Dr.Snipes in [place] If pt is not established with a provider, would they like to be referred to a provider to establish care? No .   Dental Screening: Recommended annual dental exams for proper oral hygiene  Community Resource Referral / Chronic Care Management: CRR required this visit?   No   CCM required this visit?  No      Plan:     I have personally reviewed and noted the following in the patients chart:   Medical and social history Use of alcohol, tobacco or illicit drugs  Current medications and supplements including opioid  prescriptions.  Functional ability and status Nutritional status Physical activity Advanced directives List of other physicians Hospitalizations, surgeries, and ER visits in previous 12 months Vitals Screenings to include cognitive, depression, and falls Referrals and appointments  In addition, I have reviewed and discussed with patient certain preventive protocols, quality metrics, and best practice recommendations. A written personalized care plan for preventive services as well as general preventive health recommendations were provided to patient.     Dionisio David, LPN   01/10/8269   Nurse Notes: none

## 2021-08-29 DIAGNOSIS — D225 Melanocytic nevi of trunk: Secondary | ICD-10-CM | POA: Diagnosis not present

## 2021-08-29 DIAGNOSIS — D2262 Melanocytic nevi of left upper limb, including shoulder: Secondary | ICD-10-CM | POA: Diagnosis not present

## 2021-08-29 DIAGNOSIS — D2271 Melanocytic nevi of right lower limb, including hip: Secondary | ICD-10-CM | POA: Diagnosis not present

## 2021-08-29 DIAGNOSIS — D485 Neoplasm of uncertain behavior of skin: Secondary | ICD-10-CM | POA: Diagnosis not present

## 2021-08-29 DIAGNOSIS — D2272 Melanocytic nevi of left lower limb, including hip: Secondary | ICD-10-CM | POA: Diagnosis not present

## 2021-08-29 DIAGNOSIS — L821 Other seborrheic keratosis: Secondary | ICD-10-CM | POA: Diagnosis not present

## 2021-08-29 DIAGNOSIS — X32XXXA Exposure to sunlight, initial encounter: Secondary | ICD-10-CM | POA: Diagnosis not present

## 2021-08-29 DIAGNOSIS — D2261 Melanocytic nevi of right upper limb, including shoulder: Secondary | ICD-10-CM | POA: Diagnosis not present

## 2021-08-29 DIAGNOSIS — L570 Actinic keratosis: Secondary | ICD-10-CM | POA: Diagnosis not present

## 2021-08-30 ENCOUNTER — Other Ambulatory Visit: Payer: Self-pay

## 2021-08-30 ENCOUNTER — Ambulatory Visit
Admission: RE | Admit: 2021-08-30 | Discharge: 2021-08-30 | Disposition: A | Discharge status: Home / Self Care | Payer: Medicare HMO | Source: Ambulatory Visit | Attending: Physician Assistant | Admitting: Physician Assistant

## 2021-08-30 DIAGNOSIS — Z1231 Encounter for screening mammogram for malignant neoplasm of breast: Secondary | ICD-10-CM | POA: Insufficient documentation

## 2021-09-06 ENCOUNTER — Telehealth: Payer: Self-pay | Admitting: Physician Assistant

## 2021-09-06 NOTE — Telephone Encounter (Signed)
[facility] faxed refill request for the following medications:   ezetimibe (ZETIA) 10 MG tablet     Please advise  

## 2021-09-08 ENCOUNTER — Other Ambulatory Visit: Payer: Self-pay

## 2021-09-08 DIAGNOSIS — E7800 Pure hypercholesterolemia, unspecified: Secondary | ICD-10-CM

## 2021-09-08 MED ORDER — EZETIMIBE 10 MG PO TABS: 10.0000 mg | ORAL_TABLET | Freq: Every day | ORAL | 1 refills | Status: DC

## 2021-09-09 ENCOUNTER — Telehealth: Payer: Self-pay | Admitting: Physician Assistant

## 2021-09-09 ENCOUNTER — Other Ambulatory Visit: Payer: Self-pay

## 2021-09-09 DIAGNOSIS — K219 Gastro-esophageal reflux disease without esophagitis: Secondary | ICD-10-CM

## 2021-09-09 MED ORDER — OMEPRAZOLE 20 MG PO CPDR: DELAYED_RELEASE_CAPSULE | ORAL | 1 refills | Status: DC

## 2021-09-09 NOTE — Telephone Encounter (Signed)
[facility] faxed refill request for the following medications: ? ?omeprazole (PRILOSEC) 20 MG capsule  ? ?Please advise. ? ?

## 2021-09-26 ENCOUNTER — Telehealth: Payer: Self-pay | Admitting: Physician Assistant

## 2021-09-26 NOTE — Telephone Encounter (Signed)
[facility] faxed refill request for the following medications: ? ?lisinopril-hydrochlorothiazide (ZESTORETIC) 20-12.5 MG tablet  ? ?Please advise. ? ?

## 2021-09-27 ENCOUNTER — Other Ambulatory Visit: Payer: Self-pay

## 2021-09-27 ENCOUNTER — Telehealth: Payer: Self-pay | Admitting: Physician Assistant

## 2021-09-27 DIAGNOSIS — I10 Essential (primary) hypertension: Secondary | ICD-10-CM

## 2021-09-27 NOTE — Telephone Encounter (Signed)
[facility] faxed refill request for the following medications: ? ?lisinopril-hydrochlorothiazide (ZESTORETIC) 20-12.5 MG tablet  ? ?Please advise. ? ?

## 2021-09-30 ENCOUNTER — Other Ambulatory Visit: Payer: Self-pay | Admitting: Physician Assistant

## 2021-09-30 DIAGNOSIS — I10 Essential (primary) hypertension: Secondary | ICD-10-CM

## 2021-09-30 NOTE — Telephone Encounter (Signed)
Copied from [place] [id number]. Topic: Quick Communication - Rx Refill/Question ?>> Sep 30, 2021 11:38 AM Leward Quan A wrote: ?Medication: lisinopril-hydrochlorothiazide (ZESTORETIC) 20-12.5 MG tablet  ?Will be out on 10/07/21 so need an Rx sent to both pharmacies  ?Has the patient contacted their pharmacy? Yes.  [facility] stated they cant get through to provider  ?(Agent: If no, request that the patient contact the pharmacy for the refill. If patient does not wish to contact the pharmacy document the reason why and proceed with request.) ?(Agent: If yes, when and what did the pharmacy advise?) ? ?Preferred Pharmacy (with phone number or street name): [place], [place]  ?Phone:  [phone] ?Fax:  [phone] ? ?[place], [place]  ?Phone:  [phone] ?Fax:  [phone] ? ? ? ? ?Has the patient been seen for an appointment in the last year OR does the patient have an upcoming appointment? Yes.   ? ?Agent: Please be advised that RX refills may take up to 3 business days. We ask that you follow-up with your pharmacy. ?

## 2021-10-03 MED ORDER — LISINOPRIL-HYDROCHLOROTHIAZIDE 20-12.5 MG PO TABS: 1.0000 | ORAL_TABLET | Freq: Every day | ORAL | 0 refills | Status: DC

## 2021-10-03 NOTE — Telephone Encounter (Signed)
Requested Prescriptions  ?Pending Prescriptions Disp Refills  ?? lisinopril-hydrochlorothiazide (ZESTORETIC) 20-12.5 MG tablet 90 tablet 0  ?  Sig: Take 1 tablet by mouth daily.  ?  ? Cardiovascular:  ACEI + Diuretic Combos Failed - 10/03/2021  9:34 PM  ?  ?  Failed - Last BP in normal range  ?  BP Readings from Last 1 Encounters:  ?06/27/21 (!) 147/55  ?   ?  ?  Passed - Na in normal range and within 180 days  ?  Sodium  ?Date Value Ref Range Status  ?06/27/2021 143 134 - 144 mmol/L Final  ?12/07/2011 141 136 - 145 mmol/L Final  ?   ?  ?  Passed - K in normal range and within 180 days  ?  Potassium  ?Date Value Ref Range Status  ?06/27/2021 4.0 3.5 - 5.2 mmol/L Final  ?12/07/2011 4.1 3.5 - 5.1 mmol/L Final  ?   ?  ?  Passed - Cr in normal range and within 180 days  ?  Creatinine  ?Date Value Ref Range Status  ?12/07/2011 0.66 0.60 - 1.30 mg/dL Final  ? ?Creatinine, Ser  ?Date Value Ref Range Status  ?06/27/2021 0.86 0.57 - 1.00 mg/dL Final  ?   ?  ?  Passed - eGFR is 30 or above and within 180 days  ?  EGFR (African American)  ?Date Value Ref Range Status  ?12/07/2011 >60  Final  ? ?GFR calc Af Wyvonnia Lora  ?Date Value Ref Range Status  ?04/29/2020 83 >59 mL/min/1.73 Final  ?  Comment:  ?  **In accordance with recommendations from the NKF-ASN Task force,** ?  [facility] is in the process of updating its eGFR calculation to the ?  2021 CKD-EPI creatinine equation that estimates kidney function ?  without a race variable. ?  ? ?EGFR (Non-African Amer.)  ?Date Value Ref Range Status  ?12/07/2011 >60  Final  ?  Comment:  ?  eGFR values <37m/min/1.73 m2 may be an indication of chronic ?kidney disease (CKD). ?Calculated eGFR is useful in patients with stable renal function. ?The eGFR calculation will not be reliable in acutely ill patients ?when serum creatinine is changing rapidly. It is not useful in  ?patients on dialysis. The eGFR calculation may not be applicable ?to patients at the low and high extremes of body sizes,  pregnant ?women, and vegetarians. ?  ? ?GFR calc non Af Amer  ?Date Value Ref Range Status  ?04/29/2020 72 >59 mL/min/1.73 Final  ? ?eGFR  ?Date Value Ref Range Status  ?06/27/2021 74 >59 mL/min/1.73 Final  ?   ?  ?  Passed - Patient is not pregnant  ?  ?  Passed - Valid encounter within last 6 months  ?  Recent Outpatient Visits   ?      ? 3 months ago Elevated lipoprotein(a)  ? [facility] [place] Comment PA-C  ? 5 months ago Essential (primary) hypertension  ? [facility]  ? 10 months ago Essential (primary) hypertension  ? [place] PA-C  ? 1 year ago Essential (primary) hypertension  ? [place] PA-C  ? 1 year ago Welcome to [employer]preventive visit  ? [place] PA-C  ?  ?  ?Future Appointments   ?        ? In 1 month [place], [facility]  ?  ? ?  ?  ?  ? ?

## 2021-10-03 NOTE — Telephone Encounter (Signed)
Last refill request sent to [facility] on 06/27/21 #90 with 1 refill. Request for medication to be sent to mail order pharmacy.  ?

## 2021-11-29 NOTE — Progress Notes (Unsigned)
I,[name],acting as a Education administrator for [employer], PA-C.,have documented all relevant documentation on the behalf of Mikey Kirschner, PA-C,as directed by  Mikey Kirschner, PA-C while in the presence of Mikey Kirschner, PA-C.   Complete physical exam   [name]   DOB: [birth year]   66 y.o. Female  MRN: [id number] Visit Date: 11/30/2021  Today's healthcare provider: Mikey Kirschner, PA-C   No chief complaint on file.  Subjective    Bridget Green is a 66 y.o. female who presents today for a complete physical exam.  She reports consuming a {diet types:17450} diet. {Exercise:19826} She generally feels {well/fairly well/poorly:18703}. She reports sleeping {well/fairly well/poorly:18703}. She {does/does not:200015} have additional problems to discuss today.  HPI  ***  Past Medical History:  Diagnosis Date   Arthritis    Family history of adverse reaction to anesthesia    DAD-HARD TO WAKE UP   GERD (gastroesophageal reflux disease)    History of hiatal hernia    Hyperlipidemia    Hypertension    Past Surgical History:  Procedure Laterality Date   ADENOIDECTOMY  1960   APPENDECTOMY  1983   BREAST BIOPSY Right 06/20/2018   stereo for distortion  SMALL COMPLEX SCLEROSING LESION   BREAST EXCISIONAL BIOPSY Right 07/22/2018   NP for  SMALL COMPLEX SCLEROSING LESION   BREAST LUMPECTOMY WITH NEEDLE LOCALIZATION Right 07/22/2018   Procedure: BREAST LUMPECTOMY WITH NEEDLE LOCALIZATION;  Surgeon: Olean Ree, MD;  Location: [facility];  Service: General;  Laterality: Right;   CESAREAN SECTION  [id number]   CHOLECYSTECTOMY  12/2011   COLONOSCOPY WITH PROPOFOL N/A 02/10/2021   Procedure: COLONOSCOPY WITH PROPOFOL;  Surgeon: Lucilla Lame, MD;  Location: [place];  Service: Endoscopy;  Laterality: N/A;   LASIK Bilateral 2008   POLYPECTOMY  02/10/2021   Procedure: POLYPECTOMY;  Surgeon: Lucilla Lame, MD;  Location: [place];  Service: Endoscopy;;    TONSILLECTOMY  1960   Social History   Socioeconomic History   Marital status: Married    Spouse name: Not on file   Number of children: Not on file   Years of education: Not on file   Highest education level: Not on file  Occupational History   Not on file  Tobacco Use   Smoking status: Never   Smokeless tobacco: Never  Vaping Use   Vaping Use: Never used  Substance and Sexual Activity   Alcohol use: Yes    Alcohol/week: 0.0 standard drinks    Comment: OCCASIONALLY   Drug use: No   Sexual activity: Not on file  Other Topics Concern   Not on file  Social History Narrative   Not on file   Social Determinants of Health   Financial Resource Strain: Low Risk    Difficulty of Paying Living Expenses: Not hard at all  Food Insecurity: No Food Insecurity   Worried About Charity fundraiser in the Last Year: Never true   [place] in the Last Year: Never true  Transportation Needs: No Transportation Needs   Lack of Transportation (Medical): No   Lack of Transportation (Non-Medical): No  Physical Activity: Sufficiently Active   Days of Exercise per Week: 5 days   Minutes of Exercise per Session: 60 min  Stress: No Stress Concern Present   Feeling of Stress : Not at all  Social Connections: Moderately Integrated   Frequency of Communication with Friends and Family: Three times a week   Frequency of Social Gatherings with  Friends and Family: Twice a week   Attends Religious Services: Never   Marine scientist or Organizations: Yes   Attends Music therapist: More than 4 times per year   Marital Status: Married  Human resources officer Violence: Not At Risk   Fear of Current or Ex-Partner: No   Emotionally Abused: No   Physically Abused: No   Sexually Abused: No   Family Status  Relation Name Status   Father  Deceased at age 23   Sister  42   MGM  Deceased at age 90   MGF  Deceased   PGM  Deceased   PGF  Deceased   Mother  (Not Specified)    Cousin  (Not Specified)   Family History  Problem Relation Age of Onset   Colon cancer Father    Arthritis Sister    Hypertension Sister    Heart attack Maternal Grandfather    Heart attack Paternal Grandmother    Thyroid cancer Paternal Grandmother    Aneurysm Paternal Grandfather    Arthritis Mother    Hypertension Mother    Hyperlipidemia Mother    Breast cancer Cousin    Allergies  Allergen Reactions   Penicillins Itching and Other (See Comments)    DID THE REACTION INVOLVE: Swelling of the face/tongue/throat, SOB, or low BP? No Sudden or severe rash/hives, skin peeling, or the inside of the mouth or nose? No Did it require medical treatment? No When did it last happen? Unknown age If all above answers are "NO", may proceed with cephalosporin use.    Sulfa Antibiotics Itching    Patient Care Team: Mikey Kirschner, PA-C as PCP - General (Physician Assistant)   Medications: Outpatient Medications Prior to Visit  Medication Sig   atorvastatin (LIPITOR) 20 MG tablet Take 1 tablet (20 mg total) by mouth daily.   co-enzyme Q-10 30 MG capsule Take 30 mg by mouth 3 (three) times daily.   ezetimibe (ZETIA) 10 MG tablet Take 1 tablet (10 mg total) by mouth daily.   fluticasone (FLONASE) 50 MCG/ACT nasal spray USE 2 SPRAYS IN EACH NOSTRIL AT BEDTIMEUSE 2 SPRAYS IN EACH NOSTRIL AT BEDTIME   lisinopril-hydrochlorothiazide (ZESTORETIC) 20-12.5 MG tablet Take 1 tablet by mouth daily.   Multiple Vitamins-Minerals (CENTRUM SILVER ADULT 50+ PO) Take 1 tablet by mouth daily.   omeprazole (PRILOSEC) 20 MG capsule TAKE 1 CAPSULE EVERY DAY   VITAMIN D PO Take by mouth.   No facility-administered medications prior to visit.    Review of Systems  {Labs  Heme  Chem  Endocrine  Serology  Results Review (optional):23779}  Objective    There were no vitals taken for this visit. {Show previous vital signs (optional):23777}   Physical Exam  ***  Last depression screening  scores    08/16/2021    3:39 PM 11/29/2020   10:41 AM 11/28/2019    8:20 AM  PHQ 2/9 Scores  PHQ - 2 Score 0 0 0  PHQ- 9 Score   2   Last fall risk screening    08/16/2021    3:42 PM  Fall Risk   Falls in the past year? 1  Number falls in past yr: 0  Injury with Fall? 1  Risk for fall due to : History of fall(s)  Follow up Falls prevention discussed   Last Audit-C alcohol use screening    08/16/2021    3:38 PM  Alcohol Use Disorder Test (AUDIT)  1. How often do you have  a drink containing alcohol? 1  2. How many drinks containing alcohol do you have on a typical day when you are drinking? 0  3. How often do you have six or more drinks on one occasion? 0  AUDIT-C Score 1   A score of 3 or more in women, and 4 or more in men indicates increased risk for alcohol abuse, EXCEPT if all of the points are from question 1   No results found for any visits on 11/30/21.  Assessment & Plan    Routine Health Maintenance and Physical Exam  Exercise Activities and Dietary recommendations  Goals      DIET - EAT MORE FRUITS AND VEGETABLES        Immunization History  Administered Date(s) Administered   Fluad Quad(high Dose 65+) 04/29/2020, 05/05/2021   Influenza,inj,Quad PF,6+ Mos 04/24/2017, 03/10/2019   Influenza-Unspecified 05/10/2017, 04/09/2018   PFIZER(Purple Top)SARS-COV-2 Vaccination 10/07/2019, 10/28/2019   Pneumococcal Conjugate-13 04/28/2019   Pneumococcal Polysaccharide-23 04/29/2020   Tdap 03/26/2015   Zoster Recombinat (Shingrix) 04/28/2019, 08/25/2019   Zoster, Live 11/27/2014    Health Maintenance  Topic Date Due   COVID-19 Vaccine (3 - Booster for Pfizer series) 12/23/2019   INFLUENZA VACCINE  02/07/2022   MAMMOGRAM  08/30/2022   TETANUS/TDAP  03/25/2025   COLONOSCOPY (Pts 45-87yr Insurance coverage will need to be confirmed)  02/10/2026   Pneumonia Vaccine 68 Years old  Completed   DEXA SCAN  Completed   Hepatitis C Screening  Completed   Zoster  Vaccines- Shingrix  Completed   HPV VACCINES  Aged Out    Discussed health benefits of physical activity, and encouraged her to engage in regular exercise appropriate for her age and condition.  ***  No follow-ups on file.     {provider attestation***:1}   LMikey Kirschner PA-C  [phone](phone) [phone](fax)  [place]

## 2021-11-30 ENCOUNTER — Encounter: Payer: Self-pay | Admitting: Physician Assistant

## 2021-11-30 ENCOUNTER — Ambulatory Visit (INDEPENDENT_AMBULATORY_CARE_PROVIDER_SITE_OTHER): Payer: Medicare HMO | Admitting: Physician Assistant

## 2021-11-30 VITALS — BP 126/62 | HR 67 | Ht 64.0 in | Wt 177.1 lb

## 2021-11-30 DIAGNOSIS — R739 Hyperglycemia, unspecified: Secondary | ICD-10-CM

## 2021-11-30 DIAGNOSIS — I10 Essential (primary) hypertension: Secondary | ICD-10-CM

## 2021-11-30 DIAGNOSIS — T162XXA Foreign body in left ear, initial encounter: Secondary | ICD-10-CM | POA: Diagnosis not present

## 2021-11-30 DIAGNOSIS — E7841 Elevated Lipoprotein(a): Secondary | ICD-10-CM | POA: Diagnosis not present

## 2021-11-30 DIAGNOSIS — K2100 Gastro-esophageal reflux disease with esophagitis, without bleeding: Secondary | ICD-10-CM | POA: Diagnosis not present

## 2021-11-30 DIAGNOSIS — Z0000 Encounter for general adult medical examination without abnormal findings: Secondary | ICD-10-CM

## 2021-11-30 NOTE — Assessment & Plan Note (Signed)
Managed / controlled with medication continue Will check cbc

## 2021-11-30 NOTE — Assessment & Plan Note (Signed)
Myalgias w/ statin pt wants to stop anyway; 20 lb weight loss  If ldl < 70 can consider stopping statin The 10-year ASCVD risk score (Arnett DK, et al., 2019) is: 7.4%

## 2021-11-30 NOTE — Assessment & Plan Note (Signed)
Stable today continue current medication Reviewed last cmp > 6 mo ago, ordered cmp today

## 2021-11-30 NOTE — Assessment & Plan Note (Signed)
Tip of hearing aid embedded in ear canal Attempt to manually extract unsuccessful  Urgent referral to ent

## 2021-12-01 DIAGNOSIS — H905 Unspecified sensorineural hearing loss: Secondary | ICD-10-CM | POA: Diagnosis not present

## 2021-12-01 DIAGNOSIS — T162XXA Foreign body in left ear, initial encounter: Secondary | ICD-10-CM | POA: Diagnosis not present

## 2021-12-01 LAB — COMPREHENSIVE METABOLIC PANEL
ALT: 25 IU/L (ref 0–32)
AST: 24 IU/L (ref 0–40)
Albumin/Globulin Ratio: 1.7 (ref 1.2–2.2)
Albumin: 4.4 g/dL (ref 3.8–4.8)
Alkaline Phosphatase: 98 IU/L (ref 44–121)
BUN/Creatinine Ratio: 19 (ref 12–28)
BUN: 15 mg/dL (ref 8–27)
Bilirubin Total: 0.5 mg/dL (ref 0.0–1.2)
CO2: 24 mmol/L (ref 20–29)
Calcium: 9.8 mg/dL (ref 8.7–10.3)
Chloride: 105 mmol/L (ref 96–106)
Creatinine, Ser: 0.8 mg/dL (ref 0.57–1.00)
Globulin, Total: 2.6 g/dL (ref 1.5–4.5)
Glucose: 112 mg/dL — ABNORMAL HIGH (ref 70–99)
Potassium: 4.5 mmol/L (ref 3.5–5.2)
Sodium: 143 mmol/L (ref 134–144)
Total Protein: 7 g/dL (ref 6.0–8.5)
eGFR: 81 mL/min/{1.73_m2} (ref >59)

## 2021-12-01 LAB — HEMOGLOBIN A1C
Est. average glucose Bld gHb Est-mCnc: 128 mg/dL
Hgb A1c MFr Bld: 6.1 % — ABNORMAL HIGH (ref 4.8–5.6)

## 2021-12-01 LAB — LIPID PANEL
Chol/HDL Ratio: 2 ratio (ref 0.0–4.4)
Cholesterol, Total: 123 mg/dL (ref 100–199)
HDL: 61 mg/dL (ref >39)
LDL Chol Calc (NIH): 46 mg/dL (ref 0–99)
Triglycerides: 80 mg/dL (ref 0–149)
VLDL Cholesterol Cal: 16 mg/dL (ref 5–40)

## 2021-12-01 LAB — CBC
Hematocrit: 42.3 % (ref 34.0–46.6)
Hemoglobin: 14.4 g/dL (ref 11.1–15.9)
MCH: 31 pg (ref 26.6–33.0)
MCHC: 34 g/dL (ref 31.5–35.7)
MCV: 91 fL (ref 79–97)
Platelets: 234 10*3/uL (ref 150–450)
RBC: 4.64 x10E6/uL (ref 3.77–5.28)
RDW: 12.6 % (ref 11.7–15.4)
WBC: 5.4 10*3/uL (ref 3.4–10.8)

## 2021-12-16 ENCOUNTER — Other Ambulatory Visit: Payer: Self-pay | Admitting: Physician Assistant

## 2021-12-16 DIAGNOSIS — I10 Essential (primary) hypertension: Secondary | ICD-10-CM

## 2022-02-13 ENCOUNTER — Telehealth: Payer: Self-pay | Admitting: Physician Assistant

## 2022-02-13 DIAGNOSIS — J309 Allergic rhinitis, unspecified: Secondary | ICD-10-CM

## 2022-02-13 MED ORDER — FLUTICASONE PROPIONATE 50 MCG/ACT NA SUSP: NASAL | 0 refills | Status: DC

## 2022-02-13 NOTE — Addendum Note (Signed)
Addended by: Barnie Mort on: 02/13/2022 11:25 AM   Modules accepted: Orders

## 2022-02-13 NOTE — Telephone Encounter (Signed)
[facility] faxed refill request for the following medications:  fluticasone (FLONASE) 50 MCG/ACT nasal spray   Please advise.

## 2022-04-28 ENCOUNTER — Other Ambulatory Visit: Payer: Self-pay | Admitting: Physician Assistant

## 2022-04-28 DIAGNOSIS — J309 Allergic rhinitis, unspecified: Secondary | ICD-10-CM

## 2022-05-01 ENCOUNTER — Other Ambulatory Visit: Payer: Self-pay | Admitting: Physician Assistant

## 2022-05-01 DIAGNOSIS — E7800 Pure hypercholesterolemia, unspecified: Secondary | ICD-10-CM

## 2022-06-07 ENCOUNTER — Encounter: Payer: Self-pay | Admitting: Physician Assistant

## 2022-06-07 ENCOUNTER — Ambulatory Visit (INDEPENDENT_AMBULATORY_CARE_PROVIDER_SITE_OTHER): Payer: Medicare HMO | Admitting: Physician Assistant

## 2022-06-07 VITALS — BP 136/67 | HR 71 | Wt 169.4 lb

## 2022-06-07 DIAGNOSIS — E782 Mixed hyperlipidemia: Secondary | ICD-10-CM | POA: Diagnosis not present

## 2022-06-07 DIAGNOSIS — Z23 Encounter for immunization: Secondary | ICD-10-CM

## 2022-06-07 DIAGNOSIS — Z1231 Encounter for screening mammogram for malignant neoplasm of breast: Secondary | ICD-10-CM | POA: Diagnosis not present

## 2022-06-07 DIAGNOSIS — I10 Essential (primary) hypertension: Secondary | ICD-10-CM | POA: Diagnosis not present

## 2022-06-07 NOTE — Assessment & Plan Note (Signed)
Chronic, controlled on medication Managed with lisinopril 20 mg hctz 12.5 mg continue as prescribed Reviewed cmp F'u 75mo

## 2022-06-07 NOTE — Assessment & Plan Note (Signed)
Currently un medicated as pt lost ~30 lbs and LDL was < 70. Will repeat lipids w/o medication, goal is  LDL < 100

## 2022-06-07 NOTE — Progress Notes (Signed)
   I,[name],acting as a scribe for Lindsay Drubel, PA-C.,have documented all relevant documentation on the behalf of Lindsay Drubel, PA-C,as directed by  Lindsay Drubel, PA-C while in the presence of Lindsay Drubel, PA-C.   Established patient visit   [name]   DOB: 05/16/1956   66 y.o. Female  MRN: [id number] Visit Date: 06/07/2022  Today's healthcare provider: Lindsay Drubel, PA-C   Cc. Htn , hld f/u  Subjective    HPI   Hypertension, follow-up  BP Readings from Last 3 Encounters:  06/07/22 136/67  11/30/21 126/62  06/27/21 (!) 147/55   Wt Readings from Last 3 Encounters:  06/07/22 169 lb 6.4 oz (76.8 kg)  11/30/21 177 lb 1.6 oz (80.3 kg)  06/27/21 197 lb 12.8 oz (89.7 kg)     She was last seen for hypertension 6 months ago.  BP at that visit was 126/62. Management since that visit includes continue current treatment.  She reports excellent compliance with treatment. She is not having side effects.   Outside blood pressures are: 136/67 this morning Symptoms:none  Pertinent labs Lab Results  Component Value Date   CHOL 123 11/30/2021   HDL 61 11/30/2021   LDLCALC 46 11/30/2021   TRIG 80 11/30/2021   CHOLHDL 2.0 11/30/2021   Lab Results  Component Value Date   NA 143 11/30/2021   K 4.5 11/30/2021   CREATININE 0.80 11/30/2021   EGFR 81 11/30/2021   GLUCOSE 112 (H) 11/30/2021   TSH 0.600 11/29/2020     The ASCVD Risk score (Arnett DK, et al., 2019) failed to calculate for the following reasons:   The valid total cholesterol range is 130 to 320 mg/dL  ---------------------------------------------------------------------------------------------------  Lipid/Cholesterol, Follow-up  Last lipid panel Other pertinent labs  Lab Results  Component Value Date   CHOL 123 11/30/2021   HDL 61 11/30/2021   LDLCALC 46 11/30/2021   TRIG 80 11/30/2021   CHOLHDL 2.0 11/30/2021   Lab Results  Component Value Date   ALT 25 11/30/2021    AST 24 11/30/2021   PLT 234 11/30/2021   TSH 0.600 11/29/2020     She was last seen for this 6 months ago.  Pt has stopped her statin,   She reports excellent compliance with treatment. Symptoms:none  The ASCVD Risk score (Arnett DK, et al., 2019) failed to calculate for the following reasons:   The valid total cholesterol range is 130 to 320 mg/dL  ---------------------------------------------------------------------------------------------------   Medications: Outpatient Medications Prior to Visit  Medication Sig   co-enzyme Q-10 30 MG capsule Take 30 mg by mouth 3 (three) times daily.   ezetimibe (ZETIA) 10 MG tablet TAKE 1 TABLET EVERY DAY   fluticasone (FLONASE) 50 MCG/ACT nasal spray USE 2 SPRAYS IN EACH NOSTRIL AT BEDTIME   lisinopril-hydrochlorothiazide (ZESTORETIC) 20-12.5 MG tablet TAKE 1 TABLET EVERY DAY   Multiple Vitamins-Minerals (CENTRUM SILVER ADULT 50+ PO) Take 1 tablet by mouth daily.   omeprazole (PRILOSEC) 20 MG capsule TAKE 1 CAPSULE EVERY DAY   VITAMIN D PO Take by mouth.   atorvastatin (LIPITOR) 20 MG tablet Take 1 tablet (20 mg total) by mouth daily. (Patient not taking: Reported on 06/07/2022)   No facility-administered medications prior to visit.    Review of Systems  Constitutional:  Negative for fatigue and fever.  Respiratory:  Negative for cough and shortness of breath.   Cardiovascular:  Negative for chest pain and leg swelling.  Gastrointestinal:  Negative for abdominal pain.  Neurological:    Negative for dizziness and headaches.      Objective    Blood pressure 136/67, pulse 71, weight 169 lb 6.4 oz (76.8 kg), SpO2 100 %.   Physical Exam Constitutional:      General: She is awake.     Appearance: She is well-developed.  HENT:     Head: Normocephalic.  Eyes:     Conjunctiva/sclera: Conjunctivae normal.  Cardiovascular:     Rate and Rhythm: Normal rate and regular rhythm.     Heart sounds: Normal heart sounds.  Pulmonary:      Effort: Pulmonary effort is normal.     Breath sounds: Normal breath sounds.  Skin:    General: Skin is warm.  Neurological:     Mental Status: She is alert and oriented to person, place, and time.  Psychiatric:        Attention and Perception: Attention normal.        Mood and Affect: Mood normal.        Speech: Speech normal.        Behavior: Behavior is cooperative.     No results found for any visits on 06/07/22.  Assessment & Plan     Problem List Items Addressed This Visit       Cardiovascular and Mediastinum   Essential (primary) hypertension - Primary    Chronic, controlled on medication Managed with lisinopril 20 mg hctz 12.5 mg continue as prescribed Reviewed cmp F'u 6mo        Other   Mixed hyperlipidemia    Currently un medicated as pt lost ~30 lbs and LDL was < 70. Will repeat lipids w/o medication, goal is  LDL < 100        Relevant Orders   Lipid Profile   Other Visit Diagnoses     Need for influenza vaccination       Relevant Orders   Flu Vaccine QUAD High Dose(Fluad) (Completed)   Breast cancer screening by mammogram       Relevant Orders   MM 3D SCREEN BREAST BILATERAL        Return in about 6 months (around 12/06/2022) for CPE, AVW.      I, Lindsay Drubel, PA-C have reviewed all documentation for this visit. The documentation on  06/07/2022 for the exam, diagnosis, procedures, and orders are all accurate and complete.  Lindsay Drubel, PA-C [facility] [address] , [place], [address] Office: [phone] Fax: [phone]   [facility]  

## 2022-06-08 LAB — LIPID PANEL
Chol/HDL Ratio: 3 ratio (ref 0.0–4.4)
Cholesterol, Total: 186 mg/dL (ref 100–199)
HDL: 61 mg/dL (ref >39)
LDL Chol Calc (NIH): 112 mg/dL — ABNORMAL HIGH (ref 0–99)
Triglycerides: 69 mg/dL (ref 0–149)
VLDL Cholesterol Cal: 13 mg/dL (ref 5–40)

## 2022-06-14 DIAGNOSIS — H2513 Age-related nuclear cataract, bilateral: Secondary | ICD-10-CM | POA: Diagnosis not present

## 2022-06-14 DIAGNOSIS — H47293 Other optic atrophy, bilateral: Secondary | ICD-10-CM | POA: Diagnosis not present

## 2022-06-14 DIAGNOSIS — H5203 Hypermetropia, bilateral: Secondary | ICD-10-CM | POA: Diagnosis not present

## 2022-07-02 ENCOUNTER — Other Ambulatory Visit: Payer: Self-pay | Admitting: Physician Assistant

## 2022-07-02 DIAGNOSIS — K219 Gastro-esophageal reflux disease without esophagitis: Secondary | ICD-10-CM

## 2022-07-23 ENCOUNTER — Other Ambulatory Visit: Payer: Self-pay | Admitting: Physician Assistant

## 2022-07-23 DIAGNOSIS — I10 Essential (primary) hypertension: Secondary | ICD-10-CM

## 2022-08-09 DIAGNOSIS — H25043 Posterior subcapsular polar age-related cataract, bilateral: Secondary | ICD-10-CM | POA: Diagnosis not present

## 2022-08-09 DIAGNOSIS — H25013 Cortical age-related cataract, bilateral: Secondary | ICD-10-CM | POA: Diagnosis not present

## 2022-08-09 DIAGNOSIS — H18413 Arcus senilis, bilateral: Secondary | ICD-10-CM | POA: Diagnosis not present

## 2022-08-09 DIAGNOSIS — H2512 Age-related nuclear cataract, left eye: Secondary | ICD-10-CM | POA: Diagnosis not present

## 2022-08-09 DIAGNOSIS — H2513 Age-related nuclear cataract, bilateral: Secondary | ICD-10-CM | POA: Diagnosis not present

## 2022-08-23 ENCOUNTER — Ambulatory Visit (INDEPENDENT_AMBULATORY_CARE_PROVIDER_SITE_OTHER): Payer: Medicare HMO

## 2022-08-23 VITALS — Ht 64.0 in | Wt 169.0 lb

## 2022-08-23 DIAGNOSIS — Z0000 Encounter for general adult medical examination without abnormal findings: Secondary | ICD-10-CM

## 2022-08-23 NOTE — Progress Notes (Signed)
I connected with  [name] on 08/23/22 by a audio enabled telemedicine application and verified that I am speaking with the correct person using two identifiers.  Patient Location: Home  Provider Location: Office/Clinic  I discussed the limitations of evaluation and management by telemedicine. The patient expressed understanding and agreed to proceed.  Subjective:   Bridget Green is a 67 y.o. female who presents for Medicare Annual (Subsequent) preventive examination.  Review of Systems     Cardiac Risk Factors include: advanced age (>44mn, >>54women);dyslipidemia;hypertension     Objective:    Today's Vitals   08/23/22 1402  Weight: 169 lb (76.7 kg)  Height: 5' 4"$  (1.626 m)   Body mass index is 29.01 kg/m.     08/23/2022    2:21 PM 08/16/2021    3:41 PM 02/10/2021    9:26 AM 07/19/2018   11:34 AM  Advanced Directives  Does Patient Have a Medical Advance Directive? No No No No  Type of Advance Directive   Healthcare Power of Attorney   Would patient like information on creating a medical advance directive? No - Patient declined No - Patient declined No - Patient declined     Current Medications (verified) Outpatient Encounter Medications as of 08/23/2022  Medication Sig   co-enzyme Q-10 30 MG capsule Take 30 mg by mouth 3 (three) times daily.   ezetimibe (ZETIA) 10 MG tablet TAKE 1 TABLET EVERY DAY   fluticasone (FLONASE) 50 MCG/ACT nasal spray USE 2 SPRAYS IN EACH NOSTRIL AT BEDTIME   lisinopril-hydrochlorothiazide (ZESTORETIC) 20-12.5 MG tablet TAKE 1 TABLET EVERY DAY   Multiple Vitamins-Minerals (CENTRUM SILVER ADULT 50+ PO) Take 1 tablet by mouth daily.   omeprazole (PRILOSEC) 20 MG capsule TAKE 1 CAPSULE EVERY DAY   VITAMIN D PO Take by mouth.   atorvastatin (LIPITOR) 20 MG tablet Take 1 tablet (20 mg total) by mouth daily. (Patient not taking: Reported on 06/07/2022)   No facility-administered encounter medications on file as of 08/23/2022.    Allergies  (verified) Penicillins and Sulfa antibiotics   History: Past Medical History:  Diagnosis Date   Arthritis    Family history of adverse reaction to anesthesia    DAD-HARD TO WAKE UP   GERD (gastroesophageal reflux disease)    History of hiatal hernia    Hyperlipidemia    Hypertension    Past Surgical History:  Procedure Laterality Date   ADENOIDECTOMY  1960   APPENDECTOMY  1983   BREAST BIOPSY Right 06/20/2018   stereo for distortion  SMALL COMPLEX SCLEROSING LESION   BREAST EXCISIONAL BIOPSY Right 07/22/2018   NP for  SMALL COMPLEX SCLEROSING LESION   BREAST LUMPECTOMY WITH NEEDLE LOCALIZATION Right 07/22/2018   Procedure: BREAST LUMPECTOMY WITH NEEDLE LOCALIZATION;  Surgeon: POlean Ree MD;  Location: [facility];  Service: General;  Laterality: Right;   CESAREAN SECTION  [id number]  CHOLECYSTECTOMY  12/2011   COLONOSCOPY WITH PROPOFOL N/A 02/10/2021   Procedure: COLONOSCOPY WITH PROPOFOL;  Surgeon: WLucilla Lame MD;  [facility]  Service: Endoscopy;  Laterality: N/A;   LASIK Bilateral 2008   POLYPECTOMY  02/10/2021   Procedure: POLYPECTOMY;  Surgeon: WLucilla Lame MD;  [facility]  Service: Endoscopy;;   TONSILLECTOMY  1960   Family History  Problem Relation Age of Onset   Colon cancer Father    Arthritis Sister    Hypertension Sister    Heart attack Maternal Grandfather    Heart attack Paternal Grandmother  Thyroid cancer Paternal Grandmother    Aneurysm Paternal Grandfather    Arthritis Mother    Hypertension Mother    Hyperlipidemia Mother    Breast cancer Cousin    Social History   Socioeconomic History   Marital status: Married    Spouse name: Not on file   Number of children: Not on file   Years of education: Not on file   Highest education level: Not on file  Occupational History   Not on file  Tobacco Use   Smoking status: Never   Smokeless tobacco: Never  Vaping Use   Vaping Use: Never used  Substance and  Sexual Activity   Alcohol use: Yes    Alcohol/week: 0.0 standard drinks of alcohol    Comment: OCCASIONALLY   Drug use: No   Sexual activity: Not on file  Other Topics Concern   Not on file  Social History Narrative   Not on file   Social Determinants of Health   Financial Resource Strain: Low Risk  (08/23/2022)   Overall Financial Resource Strain (CARDIA)    Difficulty of Paying Living Expenses: Not hard at all  Food Insecurity: No Food Insecurity (08/23/2022)   Hunger Vital Sign    Worried About Running Out of Food in the Last Year: Never true    Ran Out of Food in the Last Year: Never true  Transportation Needs: No Transportation Needs (08/23/2022)   PRAPARE - Hydrologist (Medical): No    Lack of Transportation (Non-Medical): No  Physical Activity: Sufficiently Active (08/16/2021)   Exercise Vital Sign    Days of Exercise per Week: 5 days    Minutes of Exercise per Session: 60 min  Stress: No Stress Concern Present (08/23/2022)   Paia    Feeling of Stress : Not at all  Social Connections: [place] (08/23/2022)   Social Connection and Isolation Panel [NHANES]    Frequency of Communication with Friends and Family: More than three times a week    Frequency of Social Gatherings with Friends and Family: Three times a week    Attends Religious Services: More than 4 times per year    Active Member of Clubs or Organizations: Yes    Attends Music therapist: More than 4 times per year    Marital Status: Married    Tobacco Counseling Counseling given: Not Answered   Clinical Intake:  Pre-visit preparation completed: Yes  Pain : No/denies pain     BMI - recorded: 29.01 Nutritional Status: BMI 25 -29 Overweight Nutritional Risks: None  How often do you need to have someone help you when you read instructions, pamphlets, or other written materials from  your doctor or pharmacy?: 1 - Never  Diabetic?no  Interpreter Needed?: No  Information entered by :: B.Artisha Capri,LPN   Activities of Daily Living    08/23/2022    2:21 PM 08/23/2022    2:13 PM  In your present state of health, do you have any difficulty performing the following activities:  Hearing?  1  Comment wears hearing aides   Vision? 1   Comment a little: cataract surgery in March   Difficulty concentrating or making decisions? 0   Walking or climbing stairs? 0   Dressing or bathing? 0   Doing errands, shopping? 0   Preparing Food and eating ? N   Using the Toilet? N   In the past six months, have you  accidently leaked urine? N   Do you have problems with loss of bowel control? N   Managing your Medications? N   Managing your Finances? N   Housekeeping or managing your Housekeeping? N     Patient Care Team: Mikey Kirschner, PA-C as PCP - General (Physician Assistant)  Indicate any recent Medical Services you may have received from other than [facility] providers in the past year (date may be approximate).     Assessment:   This is a routine wellness examination for Kathlene.  Hearing/Vision screen Hearing Screening - Comments:: Adequate w/hearing aides Vision Screening - Comments:: Cataract surgery 09/12/22 and 09/19/22.UTD w/Ryan Snipes in [place]  Dietary issues and exercise activities discussed: Current Exercise Habits: Home exercise routine, Type of exercise: walking (walks 1.5-26mles 5 days;goes to gym also), Time (Minutes): 60, Frequency (Times/Week): 5, Weekly Exercise (Minutes/Week): 300, Intensity: Mild, Exercise limited by: None identified   Goals Addressed             This Visit's Progress    DIET - EAT MORE FRUITS AND VEGETABLES   On track      Depression Screen    08/23/2022    2:10 PM 11/30/2021    9:13 AM 08/16/2021    3:39 PM 11/29/2020   10:41 AM 11/28/2019    8:20 AM 08/19/2018    9:28 AM 08/24/2017    8:58 AM  PHQ 2/9 Scores  PHQ - 2 Score  0 0 0 0 0 0 0  PHQ- 9 Score  0   2 1 0    Fall Risk    08/23/2022    2:06 PM 11/30/2021    9:13 AM 08/16/2021    3:42 PM 08/13/2021    3:42 PM 11/29/2020   10:41 AM  Fall Risk   Falls in the past year? 0 0 1 1 0  Number falls in past yr: 0 0 0 0 0  Injury with Fall? 0 0 1 1 0  Risk for fall due to : No Fall Risks No Fall Risks History of fall(s)    Follow up Education provided;Falls prevention discussed  Falls prevention discussed      FALL RISK PREVENTION PERTAINING TO THE HOME:  Any stairs in or around the home? Yes  If so, are there any without handrails? Yes  Home free of loose throw rugs in walkways, pet beds, electrical cords, etc? Yes  Adequate lighting in your home to reduce risk of falls? Yes   ASSISTIVE DEVICES UTILIZED TO PREVENT FALLS:  Life alert? No  Use of a cane, walker or w/c? No  Grab bars in the bathroom? no Shower chair or bench in shower? No  Elevated toilet seat or a handicapped toilet? Yes   Cognitive Function:        08/23/2022    2:13 PM  6CIT Screen  What Year? 0 points  What month? 0 points  What time? 0 points  Count back from 20 0 points  Months in reverse 0 points  Repeat phrase 0 points  Total Score 0 points    Immunizations Immunization History  Administered Date(s) Administered   Fluad Quad(high Dose 65+) 04/29/2020, 05/05/2021, 06/07/2022   Influenza,inj,Quad PF,6+ Mos 04/24/2017, 03/10/2019   Influenza-Unspecified 05/10/2017, 04/09/2018   PFIZER(Purple Top)SARS-COV-2 Vaccination 10/07/2019, 10/28/2019   Pneumococcal Conjugate-13 04/28/2019   Pneumococcal Polysaccharide-23 04/29/2020   Tdap 03/26/2015   Zoster Recombinat (Shingrix) 04/28/2019, 08/25/2019   Zoster, Live 11/27/2014    TDAP status: Up to date  Flu  Vaccine status: Up to date  Pneumococcal vaccine status: Up to date  Covid-19 vaccine status: Completed vaccines  Qualifies for Shingles Vaccine? Yes   Zostavax completed Yes   Shingrix Completed?:  Yes  Screening Tests Health Maintenance  Topic Date Due   COVID-19 Vaccine (3 - 2023-24 season) 03/10/2022   MAMMOGRAM  08/30/2022   Medicare Annual Wellness (AWV)  08/24/2023   DTaP/Tdap/Td (2 - Td or Tdap) 03/25/2025   COLONOSCOPY (Pts 45-20yr Insurance coverage will need to be confirmed)  02/10/2026   Pneumonia Vaccine 69 Years old  Completed   INFLUENZA VACCINE  Completed   DEXA SCAN  Completed   Hepatitis C Screening  Completed   Zoster Vaccines- Shingrix  Completed   HPV VACCINES  Aged Out    Health Maintenance  Health Maintenance Due  Topic Date Due   COVID-19 Vaccine (3 - 2023-24 season) 03/10/2022    Colorectal cancer screening: Type of screening: Colonoscopy. Completed yes. Repeat every 5 years  Mammogram status: Completed no. Repeat every year pt has scheduled for next week  Bone Density status: Completed yes. Results reflect: Bone density results: NORMAL. Repeat every 5 years.  Lung Cancer Screening: (Low Dose CT Chest recommended if Age 69-80years, 30 pack-year currently smoking OR have quit w/in 15years.) does not qualify.   Lung Cancer Screening Referral: no done in past  Additional Screening:  Hepatitis C Screening: does not qualify; Completed   Vision Screening: Recommended annual ophthalmology exams for early detection of glaucoma and other disorders of the eye. Is the patient up to date with their annual eye exam?  Yes  Who is the provider or what is the name of the office in which the patient attends annual eye exams? Dr RCleon Gustinin RWaldoIf pt is not established with a provider, would they like to be referred to a provider to establish care? No .   Dental Screening: Recommended annual dental exams for proper oral hygiene  Community Resource Referral / Chronic Care Management: CRR required this visit?  No   CCM required this visit?  No      Plan:     I have personally reviewed and noted the following in the patient's chart:    Medical and social history Use of alcohol, tobacco or illicit drugs  Current medications and supplements including opioid prescriptions. Patient is not currently taking opioid prescriptions. Functional ability and status Nutritional status Physical activity Advanced directives List of other physicians Hospitalizations, surgeries, and ER visits in previous 12 months Vitals Screenings to include cognitive, depression, and falls Referrals and appointments  In addition, I have reviewed and discussed with patient certain preventive protocols, quality metrics, and best practice recommendations. A written personalized care plan for preventive services as well as general preventive health recommendations were provided to patient.     [facility] LPN   [id number]  Nurse Notes: pt sts she is doing well. She does indicate her diastolic BP is in the [id number] medication and is concerned if too low. Denies any dizziness, etc associated. I asked pt to continue to monitor and bring home BP monitor in with next visit. Pt concurs she will do. Pt is scheduled for cataract surgery in March (09/12/22 and 09/19/22) and MMG scheduled in one week.

## 2022-08-23 NOTE — Patient Instructions (Addendum)
Bridget Green , Thank you for taking time to come for your Medicare Wellness Visit. I appreciate your ongoing commitment to your health goals. Please review the following plan we discussed and let me know if I can assist you in the future.   These are the goals we discussed:  Goals      DIET - EAT MORE FRUITS AND VEGETABLES        This is a list of the screening recommended for you and due dates:  Health Maintenance  Topic Date Due   COVID-19 Vaccine (3 - 2023-24 season) 03/10/2022   Mammogram  08/30/2022   Medicare Annual Wellness Visit  08/24/2023   DTaP/Tdap/Td vaccine (2 - Td or Tdap) 03/25/2025   Colon Cancer Screening  02/10/2026   Pneumonia Vaccine  Completed   Flu Shot  Completed   DEXA scan (bone density measurement)  Completed   Hepatitis C Screening: USPSTF Recommendation to screen - Ages 27-79 yo.  Completed   Zoster (Shingles) Vaccine  Completed   HPV Vaccine  Aged Out    Advanced directives: no  Conditions/risks identified: none  Next appointment: Follow up in one year for your annual wellness visit 08/28/2023 @2$ :30pm/telephone   Preventive Care 65 Years and Older, Female Preventive care refers to lifestyle choices and visits with your health care provider that can promote health and wellness. What does preventive care include? A yearly physical exam. This is also called an annual well check. Dental exams once or twice a year. Routine eye exams. Ask your health care provider how often you should have your eyes checked. Personal lifestyle choices, including: Daily care of your teeth and gums. Regular physical activity. Eating a healthy diet. Avoiding tobacco and drug use. Limiting alcohol use. Practicing safe sex. Taking low-dose aspirin every day. Taking vitamin and mineral supplements as recommended by your health care provider. What happens during an annual well check? The services and screenings done by your health care provider during your annual well  check will depend on your age, overall health, lifestyle risk factors, and family history of disease. Counseling  Your health care provider may ask you questions about your: Alcohol use. Tobacco use. Drug use. Emotional well-being. Home and relationship well-being. Sexual activity. Eating habits. History of falls. Memory and ability to understand (cognition). Work and work Statistician. Reproductive health. Screening  You may have the following tests or measurements: Height, weight, and BMI. Blood pressure. Lipid and cholesterol levels. These may be checked every 5 years, or more frequently if you are over 3 years old. Skin check. Lung cancer screening. You may have this screening every year starting at age 69 if you have a 30-pack-year history of smoking and currently smoke or have quit within the past 15 years. Fecal occult blood test (FOBT) of the stool. You may have this test every year starting at age 25. Flexible sigmoidoscopy or colonoscopy. You may have a sigmoidoscopy every 5 years or a colonoscopy every 10 years starting at age 59. Hepatitis C blood test. Hepatitis B blood test. Sexually transmitted disease (STD) testing. Diabetes screening. This is done by checking your blood sugar (glucose) after you have not eaten for a while (fasting). You may have this done every 1-3 years. Bone density scan. This is done to screen for osteoporosis. You may have this done starting at age 59. Mammogram. This may be done every 1-2 years. Talk to your health care provider about how often you should have regular mammograms. Talk with your health  care provider about your test results, treatment options, and if necessary, the need for more tests. Vaccines  Your health care provider may recommend certain vaccines, such as: Influenza vaccine. This is recommended every year. Tetanus, diphtheria, and acellular pertussis (Tdap, Td) vaccine. You may need a Td booster every 10 years. Zoster  vaccine. You may need this after age 35. Pneumococcal 13-valent conjugate (PCV13) vaccine. One dose is recommended after age 54. Pneumococcal polysaccharide (PPSV23) vaccine. One dose is recommended after age 12. Talk to your health care provider about which screenings and vaccines you need and how often you need them. This information is not intended to replace advice given to you by your health care provider. Make sure you discuss any questions you have with your health care provider. Document Released: 07/23/2015 Document Revised: 03/15/2016 Document Reviewed: 04/27/2015 Elsevier Interactive Patient Education  2017 [place] Prevention in the Home Falls can cause injuries. They can happen to people of all ages. There are many things you can do to make your home safe and to help prevent falls. What can I do on the outside of my home? Regularly fix the edges of walkways and driveways and fix any cracks. Remove anything that might make you trip as you walk through a door, such as a raised step or threshold. Trim any bushes or trees on the path to your home. Use bright outdoor lighting. Clear any walking paths of anything that might make someone trip, such as rocks or tools. Regularly check to see if handrails are loose or broken. Make sure that both sides of any steps have handrails. Any raised decks and porches should have guardrails on the edges. Have any leaves, snow, or ice cleared regularly. Use sand or salt on walking paths during winter. Clean up any spills in your garage right away. This includes oil or grease spills. What can I do in the bathroom? Use night lights. Install grab bars by the toilet and in the tub and shower. Do not use towel bars as grab bars. Use non-skid mats or decals in the tub or shower. If you need to sit down in the shower, use a plastic, non-slip stool. Keep the floor dry. Clean up any water that spills on the floor as soon as it happens. Remove  soap buildup in the tub or shower regularly. Attach bath mats securely with double-sided non-slip rug tape. Do not have throw rugs and other things on the floor that can make you trip. What can I do in the bedroom? Use night lights. Make sure that you have a light by your bed that is easy to reach. Do not use any sheets or blankets that are too big for your bed. They should not hang down onto the floor. Have a firm chair that has side arms. You can use this for support while you get dressed. Do not have throw rugs and other things on the floor that can make you trip. What can I do in the kitchen? Clean up any spills right away. Avoid walking on wet floors. Keep items that you use a lot in easy-to-reach places. If you need to reach something above you, use a strong step stool that has a grab bar. Keep electrical cords out of the way. Do not use floor polish or wax that makes floors slippery. If you must use wax, use non-skid floor wax. Do not have throw rugs and other things on the floor that can make you trip. What can  I do with my stairs? Do not leave any items on the stairs. Make sure that there are handrails on both sides of the stairs and use them. Fix handrails that are broken or loose. Make sure that handrails are as long as the stairways. Check any carpeting to make sure that it is firmly attached to the stairs. Fix any carpet that is loose or worn. Avoid having throw rugs at the top or bottom of the stairs. If you do have throw rugs, attach them to the floor with carpet tape. Make sure that you have a light switch at the top of the stairs and the bottom of the stairs. If you do not have them, ask someone to add them for you. What else can I do to help prevent falls? Wear shoes that: Do not have high heels. Have rubber bottoms. Are comfortable and fit you well. Are closed at the toe. Do not wear sandals. If you use a stepladder: Make sure that it is fully opened. Do not climb a  closed stepladder. Make sure that both sides of the stepladder are locked into place. Ask someone to hold it for you, if possible. Clearly mark and make sure that you can see: Any grab bars or handrails. First and last steps. Where the edge of each step is. Use tools that help you move around (mobility aids) if they are needed. These include: Canes. Walkers. Scooters. Crutches. Turn on the lights when you go into a dark area. Replace any light bulbs as soon as they burn out. Set up your furniture so you have a clear path. Avoid moving your furniture around. If any of your floors are uneven, fix them. If there are any pets around you, be aware of where they are. Review your medicines with your doctor. Some medicines can make you feel dizzy. This can increase your chance of falling. Ask your doctor what other things that you can do to help prevent falls. This information is not intended to replace advice given to you by your health care provider. Make sure you discuss any questions you have with your health care provider. Document Released: 04/22/2009 Document Revised: 12/02/2015 Document Reviewed: 07/31/2014 Elsevier Interactive Patient Education  2017 [employer].

## 2022-08-29 DIAGNOSIS — D2272 Melanocytic nevi of left lower limb, including hip: Secondary | ICD-10-CM | POA: Diagnosis not present

## 2022-08-29 DIAGNOSIS — D2271 Melanocytic nevi of right lower limb, including hip: Secondary | ICD-10-CM | POA: Diagnosis not present

## 2022-08-29 DIAGNOSIS — X32XXXA Exposure to sunlight, initial encounter: Secondary | ICD-10-CM | POA: Diagnosis not present

## 2022-08-29 DIAGNOSIS — L570 Actinic keratosis: Secondary | ICD-10-CM | POA: Diagnosis not present

## 2022-08-29 DIAGNOSIS — D225 Melanocytic nevi of trunk: Secondary | ICD-10-CM | POA: Diagnosis not present

## 2022-08-29 DIAGNOSIS — L821 Other seborrheic keratosis: Secondary | ICD-10-CM | POA: Diagnosis not present

## 2022-08-29 DIAGNOSIS — D0439 Carcinoma in situ of skin of other parts of face: Secondary | ICD-10-CM | POA: Diagnosis not present

## 2022-08-29 DIAGNOSIS — D2262 Melanocytic nevi of left upper limb, including shoulder: Secondary | ICD-10-CM | POA: Diagnosis not present

## 2022-08-29 DIAGNOSIS — D485 Neoplasm of uncertain behavior of skin: Secondary | ICD-10-CM | POA: Diagnosis not present

## 2022-08-29 DIAGNOSIS — D2261 Melanocytic nevi of right upper limb, including shoulder: Secondary | ICD-10-CM | POA: Diagnosis not present

## 2022-08-31 ENCOUNTER — Ambulatory Visit
Admission: RE | Admit: 2022-08-31 | Discharge: 2022-08-31 | Disposition: A | Discharge status: Home / Self Care | Payer: Medicare HMO | Source: Ambulatory Visit | Attending: Physician Assistant | Admitting: Physician Assistant

## 2022-08-31 DIAGNOSIS — Z1231 Encounter for screening mammogram for malignant neoplasm of breast: Secondary | ICD-10-CM | POA: Insufficient documentation

## 2022-09-04 ENCOUNTER — Other Ambulatory Visit: Payer: Self-pay | Admitting: Physician Assistant

## 2022-09-04 DIAGNOSIS — N63 Unspecified lump in unspecified breast: Secondary | ICD-10-CM

## 2022-09-04 DIAGNOSIS — R928 Other abnormal and inconclusive findings on diagnostic imaging of breast: Secondary | ICD-10-CM

## 2022-09-07 ENCOUNTER — Ambulatory Visit
Admission: RE | Admit: 2022-09-07 | Discharge: 2022-09-07 | Disposition: A | Discharge status: Home / Self Care | Payer: Medicare HMO | Source: Ambulatory Visit | Attending: Physician Assistant | Admitting: Physician Assistant

## 2022-09-07 DIAGNOSIS — N63 Unspecified lump in unspecified breast: Secondary | ICD-10-CM | POA: Diagnosis not present

## 2022-09-07 DIAGNOSIS — R928 Other abnormal and inconclusive findings on diagnostic imaging of breast: Secondary | ICD-10-CM | POA: Diagnosis not present

## 2022-09-07 DIAGNOSIS — N6002 Solitary cyst of left breast: Secondary | ICD-10-CM | POA: Diagnosis not present

## 2022-09-07 DIAGNOSIS — R922 Inconclusive mammogram: Secondary | ICD-10-CM | POA: Diagnosis not present

## 2022-09-12 DIAGNOSIS — H269 Unspecified cataract: Secondary | ICD-10-CM | POA: Diagnosis not present

## 2022-09-12 DIAGNOSIS — H2511 Age-related nuclear cataract, right eye: Secondary | ICD-10-CM | POA: Diagnosis not present

## 2022-09-12 DIAGNOSIS — H2512 Age-related nuclear cataract, left eye: Secondary | ICD-10-CM | POA: Diagnosis not present

## 2022-09-18 DIAGNOSIS — H2511 Age-related nuclear cataract, right eye: Secondary | ICD-10-CM | POA: Diagnosis not present

## 2022-09-18 DIAGNOSIS — H47293 Other optic atrophy, bilateral: Secondary | ICD-10-CM | POA: Diagnosis not present

## 2022-09-19 DIAGNOSIS — H2511 Age-related nuclear cataract, right eye: Secondary | ICD-10-CM | POA: Diagnosis not present

## 2022-09-19 DIAGNOSIS — H2512 Age-related nuclear cataract, left eye: Secondary | ICD-10-CM | POA: Diagnosis not present

## 2022-09-19 DIAGNOSIS — H269 Unspecified cataract: Secondary | ICD-10-CM | POA: Diagnosis not present

## 2022-11-15 ENCOUNTER — Other Ambulatory Visit: Payer: Self-pay | Admitting: Family Medicine

## 2022-11-15 DIAGNOSIS — K219 Gastro-esophageal reflux disease without esophagitis: Secondary | ICD-10-CM

## 2022-11-16 NOTE — Telephone Encounter (Signed)
Unable to refill per protocol, Rx request is too soon. Last refill 07/04/22 for 90 and 1 refill.  Requested Prescriptions  Pending Prescriptions Disp Refills   omeprazole (PRILOSEC) 20 MG capsule [Pharmacy Med Name: OMEPRAZOLE 20 MG Capsule Delayed Release] 90 capsule 3    Sig: TAKE 1 CAPSULE EVERY DAY     Gastroenterology: Proton Pump Inhibitors Passed - 11/15/2022  8:30 PM      Passed - Valid encounter within last 12 months    Recent Outpatient Visits           5 months ago Essential (primary) hypertension   [place] [facility] Alfredia Ferguson, PA-C   11 months ago Encounter for health maintenance examination   [facility] Alfredia Ferguson, PA-C   1 year ago Elevated lipoprotein(a)   [place] [facility] Alfredia Ferguson, PA-C   1 year ago Essential (primary) hypertension   [place] [facility] Alfredia Ferguson, PA-C   1 year ago Essential (primary) hypertension   [place] [facility] Chrismon, Jodell Cipro, PA-C       Future Appointments             In 2 weeks Ok Edwards, Lou [facility]

## 2022-11-23 DIAGNOSIS — H04222 Epiphora due to insufficient drainage, left lacrimal gland: Secondary | ICD-10-CM | POA: Diagnosis not present

## 2022-12-06 ENCOUNTER — Encounter: Payer: Self-pay | Admitting: Physician Assistant

## 2022-12-06 ENCOUNTER — Ambulatory Visit (INDEPENDENT_AMBULATORY_CARE_PROVIDER_SITE_OTHER): Payer: Medicare HMO | Admitting: Physician Assistant

## 2022-12-06 VITALS — BP 121/67 | HR 62 | Wt 176.1 lb

## 2022-12-06 DIAGNOSIS — I10 Essential (primary) hypertension: Secondary | ICD-10-CM

## 2022-12-06 DIAGNOSIS — M79672 Pain in left foot: Secondary | ICD-10-CM | POA: Diagnosis not present

## 2022-12-06 DIAGNOSIS — Z0000 Encounter for general adult medical examination without abnormal findings: Secondary | ICD-10-CM

## 2022-12-06 DIAGNOSIS — L209 Atopic dermatitis, unspecified: Secondary | ICD-10-CM

## 2022-12-06 DIAGNOSIS — R7303 Prediabetes: Secondary | ICD-10-CM | POA: Diagnosis not present

## 2022-12-06 DIAGNOSIS — R232 Flushing: Secondary | ICD-10-CM | POA: Diagnosis not present

## 2022-12-06 DIAGNOSIS — E782 Mixed hyperlipidemia: Secondary | ICD-10-CM

## 2022-12-06 MED ORDER — TRIAMCINOLONE ACETONIDE 0.5 % EX OINT: 1.0000 | TOPICAL_OINTMENT | Freq: Two times a day (BID) | CUTANEOUS | 0 refills | Status: DC

## 2022-12-06 NOTE — Assessment & Plan Note (Signed)
Repeat A1c for stability

## 2022-12-06 NOTE — Assessment & Plan Note (Signed)
Pt did not tolerate statin On zetia  Repeat lipid panel  Goal is LDL  < 100

## 2022-12-06 NOTE — Assessment & Plan Note (Signed)
Chronic, well controlled Managed with lisinopril 20 hctz 12.5 mg  Cont.  Reviewed cmp F/u 6 mo

## 2022-12-06 NOTE — Progress Notes (Signed)
I,Bridget Green,acting as a Neurosurgeon for [employer], PA-C.,have documented all relevant documentation on the behalf of Bridget Ferguson, PA-C,as directed by  Bridget Ferguson, PA-C while in the presence of Bridget Ferguson, PA-C.   Complete physical exam   [name]   DOB: Jun 19, 1956   67 y.o. Female  MRN: [id number] Visit Date: 12/06/2022  Today's healthcare provider: Alfredia Ferguson, PA-C   Cc. Cpe   Subjective    Bridget Green is a 67 y.o. female who presents today for a complete physical exam.  She reports consuming a general diet.  The patient reports going to the [facility] five times a week and taking a 2 mile walk for 45 minutes as well as attending silver sneakers twice a week for one hour each  She generally feels well. She reports sleeping well. She does have additional problems to discuss today.  HPI   Patient reports knots on the bottom of her left foot that bother her while walking.  She states she cannot feel them when she touches her foot but feels them under her foot while she is walking.  This sensation has been getting worse.  She reports of burning intermittently along the medial side of her right big toe.  Denies any skin changes.  Patient reports a cramping intermittently in her right forearm.  It is so severe when it happens causes her to drop things and need to massage her arm.  Denies injury.  She also reports bug bites on the left side of her abdomen and the back of her right calf which are itchy.  Pt reports her chronic  hot flashes are worsening in severity.   Past Surgical History:  Procedure Laterality Date   ADENOIDECTOMY  1960   APPENDECTOMY  1983   BREAST BIOPSY Right 06/20/2018   stereo for distortion  SMALL COMPLEX SCLEROSING LESION   BREAST EXCISIONAL BIOPSY Right 07/22/2018   NP for  SMALL COMPLEX SCLEROSING LESION   BREAST LUMPECTOMY WITH NEEDLE LOCALIZATION Right 07/22/2018   Procedure: BREAST LUMPECTOMY WITH NEEDLE LOCALIZATION;   Surgeon: Henrene Dodge, MD;  Location: [facility];  Service: General;  Laterality: Right;   CESAREAN SECTION  [id number]   CHOLECYSTECTOMY  12/2011   COLONOSCOPY WITH PROPOFOL N/A 02/10/2021   Procedure: COLONOSCOPY WITH PROPOFOL;  Surgeon: Midge Minium, MD;  Location: [facility];  Service: Endoscopy;  Laterality: N/A;   LASIK Bilateral 2008   POLYPECTOMY  02/10/2021   Procedure: POLYPECTOMY;  Surgeon: Midge Minium, MD;  Location: [facility];  Service: Endoscopy;;   TONSILLECTOMY  1960   Social History   Socioeconomic History   Marital status: Married    Spouse name: Not on file   Number of children: Not on file   Years of education: Not on file   Highest education level: Not on file  Occupational History   Not on file  Tobacco Use   Smoking status: Never   Smokeless tobacco: Never  Vaping Use   Vaping Use: Never used  Substance and Sexual Activity   Alcohol use: Yes    Alcohol/week: 0.0 standard drinks of alcohol    Comment: OCCASIONALLY   Drug use: No   Sexual activity: Not on file  Other Topics Concern   Not on file  Social History Narrative   Not on file   Social Determinants of Health   Financial Resource Strain: Low Risk  (08/23/2022)   Overall Financial Resource Strain (CARDIA)    Difficulty of  Paying Living Expenses: Not hard at all  Food Insecurity: No Food Insecurity (08/23/2022)   Hunger Vital Sign    Worried About Running Out of Food in the Last Year: Never true    Ran Out of Food in the Last Year: Never true  Transportation Needs: No Transportation Needs (08/23/2022)   PRAPARE - Administrator, Civil Service (Medical): No    Lack of Transportation (Non-Medical): No  Physical Activity: Sufficiently Active (08/16/2021)   Exercise Vital Sign    Days of Exercise per Week: 5 days    Minutes of Exercise per Session: 60 min  Stress: No Stress Concern Present (08/23/2022)   [employer] of Occupational Health - Occupational Stress  Questionnaire    Feeling of Stress : Not at all  Social Connections: Socially Integrated (08/23/2022)   Social Connection and Isolation Panel [NHANES]    Frequency of Communication with Friends and Family: More than three times a week    Frequency of Social Gatherings with Friends and Family: Three times a week    Attends Religious Services: More than 4 times per year    Active Member of Clubs or Organizations: Yes    Attends Banker Meetings: More than 4 times per year    Marital Status: Married  Catering manager Violence: Not At Risk (08/16/2021)   Humiliation, Afraid, Rape, and Kick questionnaire    Fear of Current or Ex-Partner: No    Emotionally Abused: No    Physically Abused: No    Sexually Abused: No   Family Status  Relation Name Status   Father  Deceased at age 43   Sister  Alive   MGM  Deceased at age 3   MGF  Deceased   PGM  Deceased   PGF  Deceased   Mother  (Not Specified)   Cousin  (Not Specified)   Family History  Problem Relation Age of Onset   Colon cancer Father    Arthritis Sister    Hypertension Sister    Heart attack Maternal Grandfather    Heart attack Paternal Grandmother    Thyroid cancer Paternal Grandmother    Aneurysm Paternal Grandfather    Arthritis Mother    Hypertension Mother    Hyperlipidemia Mother    Breast cancer Cousin    Allergies  Allergen Reactions   Penicillins Itching and Other (See Comments)    DID THE REACTION INVOLVE: Swelling of the face/tongue/throat, SOB, or low BP? No Sudden or severe rash/hives, skin peeling, or the inside of the mouth or nose? No Did it require medical treatment? No When did it last happen? Unknown age If all above answers are "NO", may proceed with cephalosporin use.    Sulfa Antibiotics Itching    Patient Care Team: Bridget Ferguson, PA-C as PCP - General (Physician Assistant)   Medications: Outpatient Medications Prior to Visit  Medication Sig   co-enzyme Q-10 30 MG capsule  Take 30 mg by mouth 3 (three) times daily.   ezetimibe (ZETIA) 10 MG tablet TAKE 1 TABLET EVERY DAY   fluticasone (FLONASE) 50 MCG/ACT nasal spray USE 2 SPRAYS IN EACH NOSTRIL AT BEDTIME   lisinopril-hydrochlorothiazide (ZESTORETIC) 20-12.5 MG tablet TAKE 1 TABLET EVERY DAY   Multiple Vitamins-Minerals (CENTRUM SILVER ADULT 50+ PO) Take 1 tablet by mouth daily.   omeprazole (PRILOSEC) 20 MG capsule TAKE 1 CAPSULE EVERY DAY   VITAMIN D PO Take by mouth.   [DISCONTINUED] atorvastatin (LIPITOR) 20 MG tablet Take 1 tablet (20  mg total) by mouth daily. (Patient not taking: Reported on 06/07/2022)   No facility-administered medications prior to visit.    Review of Systems  Constitutional:  Positive for diaphoresis and fatigue.  HENT:  Positive for hearing loss.     Objective    BP 121/67 (BP Location: Left Arm, Patient Position: Sitting, Cuff Size: Normal)   Pulse 62   Wt 176 lb 1.6 oz (79.9 kg)   SpO2 100%   BMI 30.23 kg/m   Physical Exam Constitutional:      General: She is awake.     Appearance: She is well-developed. She is not ill-appearing.  HENT:     Head: Normocephalic.     Right Ear: Tympanic membrane normal.     Left Ear: Tympanic membrane normal.     Nose: Nose normal. No congestion or rhinorrhea.     Mouth/Throat:     Pharynx: No oropharyngeal exudate or posterior oropharyngeal erythema.  Eyes:     Conjunctiva/sclera: Conjunctivae normal.     Pupils: Pupils are equal, round, and reactive to light.  Neck:     Thyroid: No thyroid mass or thyromegaly.  Cardiovascular:     Rate and Rhythm: Normal rate and regular rhythm.     Heart sounds: Normal heart sounds.  Pulmonary:     Effort: Pulmonary effort is normal.     Breath sounds: Normal breath sounds.  Abdominal:     Palpations: Abdomen is soft.     Tenderness: There is no abdominal tenderness.  Musculoskeletal:     Right lower leg: No swelling. No edema.     Left lower leg: No swelling. No edema.  Feet:      Comments: No abnormalities palpated at the bottom of bilateral feet. Lymphadenopathy:     Cervical: No cervical adenopathy.  Skin:    General: Skin is warm.  Neurological:     Mental Status: She is alert and oriented to person, place, and time.  Psychiatric:        Attention and Perception: Attention normal.        Mood and Affect: Mood normal.        Speech: Speech normal.        Behavior: Behavior normal. Behavior is cooperative.     Last depression screening scores    12/06/2022    8:53 AM 08/23/2022    2:10 PM 11/30/2021    9:13 AM  PHQ 2/9 Scores  PHQ - 2 Score 0 0 0  PHQ- 9 Score 1  0   Last fall risk screening    12/06/2022    8:53 AM  Fall Risk   Falls in the past year? 0  Number falls in past yr: 0  Injury with Fall? 0  Risk for fall due to : No Fall Risks  Follow up Falls evaluation completed   Last Audit-C alcohol use screening    12/06/2022    8:53 AM  Alcohol Use Disorder Test (AUDIT)  1. How often do you have a drink containing alcohol? 1  2. How many drinks containing alcohol do you have on a typical day when you are drinking? 0  3. How often do you have six or more drinks on one occasion? 0  AUDIT-C Score 1   A score of 3 or more in women, and 4 or more in men indicates increased risk for alcohol abuse, EXCEPT if all of the points are from question 1   No results found for any visits on  12/06/22.  Assessment & Plan    Routine Health Maintenance and Physical Exam  Exercise Activities and Dietary recommendations --balanced diet high in fiber and protein, low in sugars, carbs, fats. --physical activity/exercise 30 minutes 3-5 times a week    Immunization History  Administered Date(s) Administered   Fluad Quad(high Dose 65+) 04/29/2020, 05/05/2021, 06/07/2022   Influenza,inj,Quad PF,6+ Mos 04/24/2017, 03/10/2019   Influenza-Unspecified 05/10/2017, 04/09/2018   PFIZER(Purple Top)SARS-COV-2 Vaccination 10/07/2019, 10/28/2019   Pneumococcal  Conjugate-13 04/28/2019   Pneumococcal Polysaccharide-23 04/29/2020   Tdap 03/26/2015   Zoster Recombinat (Shingrix) 04/28/2019, 08/25/2019   Zoster, Live 11/27/2014    Health Maintenance  Topic Date Due   COVID-19 Vaccine (3 - 2023-24 season) 03/10/2022   INFLUENZA VACCINE  02/08/2023   Medicare Annual Wellness (AWV)  08/24/2023   MAMMOGRAM  09/01/2023   DTaP/Tdap/Td (2 - Td or Tdap) 03/25/2025   Colonoscopy  02/10/2026   Pneumonia Vaccine 18+ Years old  Completed   DEXA SCAN  Completed   Hepatitis C Screening  Completed   Zoster Vaccines- Shingrix  Completed   HPV VACCINES  Aged Out    Discussed health benefits of physical activity, and encouraged her to engage in regular exercise appropriate for her age and condition.  Problem List Items Addressed This Visit       Cardiovascular and Mediastinum   Essential (primary) hypertension    Chronic, well controlled Managed with lisinopril 20 hctz 12.5 mg  Cont.  Reviewed cmp F/u 6 mo         Other   Mixed hyperlipidemia    Pt did not tolerate statin On zetia  Repeat lipid panel  Goal is LDL  < 100      Relevant Orders   Lipid Profile   Prediabetes    Repeat A1c for stability       Relevant Orders   HgB A1c   Other Visit Diagnoses     Annual physical exam    -  Primary   Atopic dermatitis, unspecified type       Relevant Medications   triamcinolone ointment (KENALOG) 0.5 %   Hot flashes       Relevant Orders   CBC w/Diff/Platelet   Comprehensive Metabolic Panel (CMET)   TSH + free T4   Left foot pain       Relevant Orders   DG Foot Complete Left       4. Atopic dermatitis, unspecified type Rx steroid cream - triamcinolone ointment (KENALOG) 0.5 %; Apply 1 Application topically 2 (two) times daily.  Dispense: 30 g; Refill: 0  5. Hot flashes Will check labs - CBC w/Diff/Platelet - Comprehensive Metabolic Panel (CMET) - TSH + free T4  6. Left foot pain Ordered xray for L foot  - DG Foot  Complete Left; Future   Return in about 6 months (around 06/08/2023) for chronic conditions.     I, Bridget Ferguson, PA-C have reviewed all documentation for this visit. The documentation on  12/06/22   for the exam, diagnosis, procedures, and orders are all accurate and complete.  Bridget Ferguson, PA-C [facility] [address] [place], [place], [address] Office: [phone] Fax: [phone]   [facility]

## 2022-12-07 ENCOUNTER — Other Ambulatory Visit: Payer: Self-pay | Admitting: Physician Assistant

## 2022-12-07 ENCOUNTER — Ambulatory Visit
Admission: RE | Admit: 2022-12-07 | Discharge: 2022-12-07 | Disposition: A | Discharge status: Home / Self Care | Payer: Medicare HMO | Source: Ambulatory Visit | Attending: Physician Assistant | Admitting: Physician Assistant

## 2022-12-07 ENCOUNTER — Ambulatory Visit
Admission: RE | Admit: 2022-12-07 | Discharge: 2022-12-07 | Disposition: A | Discharge status: Home / Self Care | Payer: Medicare HMO | Attending: Physician Assistant | Admitting: Physician Assistant

## 2022-12-07 DIAGNOSIS — R232 Flushing: Secondary | ICD-10-CM

## 2022-12-07 DIAGNOSIS — M79672 Pain in left foot: Secondary | ICD-10-CM | POA: Diagnosis not present

## 2022-12-07 LAB — CBC WITH DIFFERENTIAL/PLATELET
Basophils Absolute: 0 10*3/uL (ref 0.0–0.2)
Basos: 0 %
EOS (ABSOLUTE): 0.1 10*3/uL (ref 0.0–0.4)
Eos: 2 %
Hematocrit: 42.7 % (ref 34.0–46.6)
Hemoglobin: 14.6 g/dL (ref 11.1–15.9)
Immature Grans (Abs): 0 10*3/uL (ref 0.0–0.1)
Immature Granulocytes: 0 %
Lymphocytes Absolute: 2.4 10*3/uL (ref 0.7–3.1)
Lymphs: 48 %
MCH: 31.1 pg (ref 26.6–33.0)
MCHC: 34.2 g/dL (ref 31.5–35.7)
MCV: 91 fL (ref 79–97)
Monocytes Absolute: 0.5 10*3/uL (ref 0.1–0.9)
Monocytes: 9 %
Neutrophils Absolute: 2.1 10*3/uL (ref 1.4–7.0)
Neutrophils: 41 %
Platelets: 218 10*3/uL (ref 150–450)
RBC: 4.7 x10E6/uL (ref 3.77–5.28)
RDW: 11.9 % (ref 11.7–15.4)
WBC: 5.1 10*3/uL (ref 3.4–10.8)

## 2022-12-07 LAB — COMPREHENSIVE METABOLIC PANEL
ALT: 21 IU/L (ref 0–32)
AST: 21 IU/L (ref 0–40)
Albumin/Globulin Ratio: 1.9 (ref 1.2–2.2)
Albumin: 4.3 g/dL (ref 3.9–4.9)
Alkaline Phosphatase: 97 IU/L (ref 44–121)
BUN/Creatinine Ratio: 21 (ref 12–28)
BUN: 18 mg/dL (ref 8–27)
Bilirubin Total: 0.4 mg/dL (ref 0.0–1.2)
CO2: 24 mmol/L (ref 20–29)
Calcium: 9.6 mg/dL (ref 8.7–10.3)
Chloride: 104 mmol/L (ref 96–106)
Creatinine, Ser: 0.86 mg/dL (ref 0.57–1.00)
Globulin, Total: 2.3 g/dL (ref 1.5–4.5)
Glucose: 108 mg/dL — ABNORMAL HIGH (ref 70–99)
Potassium: 5 mmol/L (ref 3.5–5.2)
Sodium: 142 mmol/L (ref 134–144)
Total Protein: 6.6 g/dL (ref 6.0–8.5)
eGFR: 74 mL/min/{1.73_m2} (ref >59)

## 2022-12-07 LAB — TSH+FREE T4
Free T4: 1.13 ng/dL (ref 0.82–1.77)
TSH: 0.874 u[IU]/mL (ref 0.450–4.500)

## 2022-12-07 LAB — LIPID PANEL
Chol/HDL Ratio: 2.8 ratio (ref 0.0–4.4)
Cholesterol, Total: 213 mg/dL — ABNORMAL HIGH (ref 100–199)
HDL: 76 mg/dL (ref >39)
LDL Chol Calc (NIH): 123 mg/dL — ABNORMAL HIGH (ref 0–99)
Triglycerides: 79 mg/dL (ref 0–149)
VLDL Cholesterol Cal: 14 mg/dL (ref 5–40)

## 2022-12-07 LAB — HEMOGLOBIN A1C
Est. average glucose Bld gHb Est-mCnc: 123 mg/dL
Hgb A1c MFr Bld: 5.9 % — ABNORMAL HIGH (ref 4.8–5.6)

## 2022-12-12 ENCOUNTER — Other Ambulatory Visit: Payer: Self-pay | Admitting: Physician Assistant

## 2022-12-12 DIAGNOSIS — M79672 Pain in left foot: Secondary | ICD-10-CM

## 2022-12-19 ENCOUNTER — Ambulatory Visit: Payer: Medicare HMO | Admitting: Podiatry

## 2022-12-19 DIAGNOSIS — M7752 Other enthesopathy of left foot: Secondary | ICD-10-CM | POA: Diagnosis not present

## 2022-12-19 NOTE — Progress Notes (Signed)
Subjective:  Patient ID: AVERYROSE MILD, female    DOB: 06/11/1956,  MRN: [id number]  Chief Complaint  Patient presents with   Toe Pain    Left great toe pain  Pt stated that she had xrays done by her primary     67 y.o. female presents with the above complaint.  Patient presents with left second metatarsophalangeal joint pain.  Patient states this came on over has been hurting for a while.  Patient has a left hallux arthritis/hallux limitus.  She has not seen anyone as prior to seeing me denies any other acute complaints.  She had an x-ray done as by her primary care physician   Review of Systems: Negative except as noted in the HPI. Denies N/V/F/Ch.  Past Medical History:  Diagnosis Date   Arthritis    Family history of adverse reaction to anesthesia    DAD-HARD TO WAKE UP   GERD (gastroesophageal reflux disease)    History of hiatal hernia    Hyperlipidemia    Hypertension     Current Outpatient Medications:    co-enzyme Q-10 30 MG capsule, Take 30 mg by mouth 3 (three) times daily., Disp: , Rfl:    ezetimibe (ZETIA) 10 MG tablet, TAKE 1 TABLET EVERY DAY, Disp: 90 tablet, Rfl: 3   fluticasone (FLONASE) 50 MCG/ACT nasal spray, USE 2 SPRAYS IN EACH NOSTRIL AT BEDTIME, Disp: 48 g, Rfl: 10   lisinopril-hydrochlorothiazide (ZESTORETIC) 20-12.5 MG tablet, TAKE 1 TABLET EVERY DAY, Disp: 90 tablet, Rfl: 3   Multiple Vitamins-Minerals (CENTRUM SILVER ADULT 50+ PO), Take 1 tablet by mouth daily., Disp: , Rfl:    omeprazole (PRILOSEC) 20 MG capsule, TAKE 1 CAPSULE EVERY DAY, Disp: 90 capsule, Rfl: 1   triamcinolone ointment (KENALOG) 0.5 %, Apply 1 Application topically 2 (two) times daily., Disp: 30 g, Rfl: 0   VITAMIN D PO, Take by mouth., Disp: , Rfl:   Social History   Tobacco Use  Smoking Status Never  Smokeless Tobacco Never    Allergies  Allergen Reactions   Penicillins Itching and Other (See Comments)    DID THE REACTION INVOLVE: Swelling of the face/tongue/throat,  SOB, or low BP? No Sudden or severe rash/hives, skin peeling, or the inside of the mouth or nose? No Did it require medical treatment? No When did it last happen? Unknown age If all above answers are "NO", may proceed with cephalosporin use.    Sulfa Antibiotics Itching   Objective:  There were no vitals filed for this visit. There is no height or weight on file to calculate BMI. Constitutional Well developed. Well nourished.  Vascular Dorsalis pedis pulses palpable bilaterally. Posterior tibial pulses palpable bilaterally. Capillary refill normal to all digits.  No cyanosis or clubbing noted. Pedal hair growth normal.  Neurologic Normal speech. Oriented to person, place, and time. Epicritic sensation to light touch grossly present bilaterally.  Dermatologic Nails well groomed and normal in appearance. No open wounds. No skin lesions.  Orthopedic: Pain on palpation left second metatarsophalangeal joint pain with range of motion of the second metatarsophalangeal joint.  No deep intra-articular pain noted   Radiographs: 3 views of sketcher mature adult left foot:: Uneven joint space narrowing noted at the left great toe no other bony abnormalities noted midfoot arthritis noted. Assessment:   1. Capsulitis of metatarsophalangeal (MTP) joint of left foot    Plan:  Patient was evaluated and treated and all questions answered.  Left second metatarsophalangeal joint capsulitis -All questions and concerns were  discussed with the patient in extensive detail given the amount of pain that she is having she will benefit from steroid injection at decreasing inflammatory component associate with pain.  Patient agrees with plan to proceed with steroid injection -A steroid injection was performed at left second MTP using 1% plain Lidocaine and 10 mg of Kenalog. This was well tolerated.   No follow-ups on file.

## 2023-01-03 ENCOUNTER — Other Ambulatory Visit: Payer: Self-pay | Admitting: Family Medicine

## 2023-01-03 DIAGNOSIS — K219 Gastro-esophageal reflux disease without esophagitis: Secondary | ICD-10-CM

## 2023-01-04 NOTE — Telephone Encounter (Signed)
Requested Prescriptions  Pending Prescriptions Disp Refills   omeprazole (PRILOSEC) 20 MG capsule [Pharmacy Med Name: OMEPRAZOLE 20 MG Capsule Delayed Release] 90 capsule 3    Sig: TAKE 1 CAPSULE EVERY DAY     Gastroenterology: Proton Pump Inhibitors Passed - 01/03/2023 11:00 AM      Passed - Valid encounter within last 12 months    Recent Outpatient Visits           4 weeks ago Annual physical exam   [facility] Alfredia Ferguson, PA-C   7 months ago Essential (primary) hypertension   [facility] Alfredia Ferguson, PA-C   1 year ago Encounter for health maintenance examination   [facility] Alfredia Ferguson, PA-C   1 year ago Elevated lipoprotein(a)   [facility] Alfredia Ferguson, PA-C   1 year ago Essential (primary) hypertension   [facility] Alfredia Ferguson, PA-C       Future Appointments             In 1 month Altamese [place], MD [facility] Endocrinology

## 2023-01-18 ENCOUNTER — Ambulatory Visit: Payer: Medicare HMO | Admitting: Podiatry

## 2023-01-18 DIAGNOSIS — M7752 Other enthesopathy of left foot: Secondary | ICD-10-CM | POA: Diagnosis not present

## 2023-01-18 NOTE — Progress Notes (Signed)
Subjective:  Patient ID: DECARLA SIEMEN, female    DOB: 1955-08-23,  MRN: [id number]  Chief Complaint  Patient presents with   Foot Pain    Patient came in today for left foot pain follow-up, patient states she is doing much better, "knot is still there but No pain"    67 y.o. female presents with the above complaint.  Patient presents for follow-up of left second metatarsophalangeal joint pain.  She states she is doing a lot better.  She denies any other acute complaints.  Review of Systems: Negative except as noted in the HPI. Denies N/V/F/Ch.  Past Medical History:  Diagnosis Date   Arthritis    Family history of adverse reaction to anesthesia    DAD-HARD TO WAKE UP   GERD (gastroesophageal reflux disease)    History of hiatal hernia    Hyperlipidemia    Hypertension     Current Outpatient Medications:    co-enzyme Q-10 30 MG capsule, Take 30 mg by mouth 3 (three) times daily., Disp: , Rfl:    ezetimibe (ZETIA) 10 MG tablet, TAKE 1 TABLET EVERY DAY, Disp: 90 tablet, Rfl: 3   fluticasone (FLONASE) 50 MCG/ACT nasal spray, USE 2 SPRAYS IN EACH NOSTRIL AT BEDTIME, Disp: 48 g, Rfl: 10   lisinopril-hydrochlorothiazide (ZESTORETIC) 20-12.5 MG tablet, TAKE 1 TABLET EVERY DAY, Disp: 90 tablet, Rfl: 3   Multiple Vitamins-Minerals (CENTRUM SILVER ADULT 50+ PO), Take 1 tablet by mouth daily., Disp: , Rfl:    omeprazole (PRILOSEC) 20 MG capsule, TAKE 1 CAPSULE EVERY DAY, Disp: 90 capsule, Rfl: 3   triamcinolone ointment (KENALOG) 0.5 %, Apply 1 Application topically 2 (two) times daily., Disp: 30 g, Rfl: 0   VITAMIN D PO, Take by mouth., Disp: , Rfl:   Social History   Tobacco Use  Smoking Status Never  Smokeless Tobacco Never    Allergies  Allergen Reactions   Penicillins Itching and Other (See Comments)    DID THE REACTION INVOLVE: Swelling of the face/tongue/throat, SOB, or low BP? No Sudden or severe rash/hives, skin peeling, or the inside of the mouth or nose? No Did it  require medical treatment? No When did it last happen? Unknown age If all above answers are "NO", may proceed with cephalosporin use.    Sulfa Antibiotics Itching   Objective:  There were no vitals filed for this visit. There is no height or weight on file to calculate BMI. Constitutional Well developed. Well nourished.  Vascular Dorsalis pedis pulses palpable bilaterally. Posterior tibial pulses palpable bilaterally. Capillary refill normal to all digits.  No cyanosis or clubbing noted. Pedal hair growth normal.  Neurologic Normal speech. Oriented to person, place, and time. Epicritic sensation to light touch grossly present bilaterally.  Dermatologic Nails well groomed and normal in appearance. No open wounds. No skin lesions.  Orthopedic: No further pain on palpation left second metatarsophalangeal joint no further pain with range of motion of the second metatarsophalangeal joint.  No deep intra-articular pain noted   Radiographs: 3 views of sketcher mature adult left foot:: Uneven joint space narrowing noted at the left great toe no other bony abnormalities noted midfoot arthritis noted. Assessment:   1. Capsulitis of metatarsophalangeal (MTP) joint of left foot     Plan:  Patient was evaluated and treated and all questions answered.  Left second metatarsophalangeal joint capsulitis -Clinically healed and officially discharged from my care.  I discussed shoe gear modification and if any foot and ankle issues arise in future she  will come back and see me.   No follow-ups on file.

## 2023-01-22 ENCOUNTER — Other Ambulatory Visit: Payer: Self-pay

## 2023-01-22 DIAGNOSIS — R232 Flushing: Secondary | ICD-10-CM

## 2023-02-16 ENCOUNTER — Other Ambulatory Visit: Payer: Medicare HMO

## 2023-02-23 ENCOUNTER — Other Ambulatory Visit: Payer: Medicare HMO

## 2023-02-26 DIAGNOSIS — D2271 Melanocytic nevi of right lower limb, including hip: Secondary | ICD-10-CM | POA: Diagnosis not present

## 2023-02-26 DIAGNOSIS — Z85828 Personal history of other malignant neoplasm of skin: Secondary | ICD-10-CM | POA: Diagnosis not present

## 2023-02-26 DIAGNOSIS — D225 Melanocytic nevi of trunk: Secondary | ICD-10-CM | POA: Diagnosis not present

## 2023-02-26 DIAGNOSIS — L570 Actinic keratosis: Secondary | ICD-10-CM | POA: Diagnosis not present

## 2023-02-26 DIAGNOSIS — X32XXXA Exposure to sunlight, initial encounter: Secondary | ICD-10-CM | POA: Diagnosis not present

## 2023-02-26 DIAGNOSIS — D2272 Melanocytic nevi of left lower limb, including hip: Secondary | ICD-10-CM | POA: Diagnosis not present

## 2023-02-26 DIAGNOSIS — D2261 Melanocytic nevi of right upper limb, including shoulder: Secondary | ICD-10-CM | POA: Diagnosis not present

## 2023-02-26 DIAGNOSIS — D2262 Melanocytic nevi of left upper limb, including shoulder: Secondary | ICD-10-CM | POA: Diagnosis not present

## 2023-02-27 ENCOUNTER — Ambulatory Visit: Payer: Medicare HMO | Admitting: "Endocrinology

## 2023-02-27 ENCOUNTER — Encounter: Payer: Self-pay | Admitting: "Endocrinology

## 2023-02-27 VITALS — BP 145/65 | HR 63 | Ht 64.0 in | Wt 183.2 lb

## 2023-02-27 DIAGNOSIS — I9589 Other hypotension: Secondary | ICD-10-CM

## 2023-02-27 DIAGNOSIS — R232 Flushing: Secondary | ICD-10-CM

## 2023-02-27 MED ORDER — PAROXETINE HCL ER 12.5 MG PO TB24: 12.5000 mg | ORAL_TABLET | Freq: Every day | ORAL | 4 refills | Status: DC

## 2023-02-27 NOTE — Progress Notes (Signed)
Outpatient Endocrinology Note Bridget [place], MD    Bridget Green September 11, 1955 [id number]  Referring Provider: Alfredia Ferguson, PA-C Primary Care Provider: Alfredia Ferguson, PA-C Reason for consultation: Subjective   Assessment & Plan  Diagnoses and all orders for this visit:  Hot flashes  Other specified hypotension  Other orders -     PARoxetine (PAXIL CR) 12.5 MG 24 hr tablet; Take 1 tablet (12.5 mg total) by mouth daily.   Discussed in detail Currently gets hot flashes every 15 minutes on worst days, each lasting 5 minutes, attributing it to sweat day at night Start Paxil CR 12.5 mg daily Follow-up in 3 months  Isolated diastolic hypotension with diastolic blood pressure dipping into 50s and 60s, whereas the systolic blood pressure remains around 140s Patient on lisinopril-HCTZ 20/12.5 mg daily combination Recommend cardiology follow-up  Return in about 3 months (around 05/30/2023).   I have reviewed current medications, nurse's notes, allergies, vital signs, past medical and surgical history, family medical history, and social history for this encounter. Counseled patient on symptoms, examination findings, lab findings, imaging results, treatment decisions and monitoring and prognosis. The patient understood the recommendations and agrees with the treatment plan. All questions regarding treatment plan were fully answered.  Bridget [place], MD  02/27/23   History of Present Illness HPI  Bridget Green is a 67 y.o. year old female who presents for evaluation of hot flashes.  Current regimen: none  Had menopause in early 39s, naturally No history of hysterectomy  Had hot flashes then, but has worsened steadily  Has dripping sweat day and night, could be one once every 15 min on worst days and last about 5 min  Patient also concerned about her diastolic blood pressure dropping low in 60s and sometimes 50s.  Patient has a blood pressure cuff at home and  measures it at home.  She is currently on lisinopril-HCTZ 20/12.5 daily combination.  No cardiac history in the family, history of hypertension in mother.  Patient has had no cardiac evaluation in the past.  Physical Exam  BP (!) 145/65   Pulse 63   Ht 5\' 4"  (1.626 m)   Wt 183 lb 3.2 oz (83.1 kg)   SpO2 98%   BMI 31.45 kg/m    Constitutional: well developed, well nourished Head: normocephalic, atraumatic Eyes: sclera anicteric, no redness Neck: supple Lungs: normal respiratory effort Neurology: alert and oriented Skin: dry, no appreciable rashes Musculoskeletal: no appreciable defects Psychiatric: normal mood and affect   Current Medications Patient's Medications  New Prescriptions   PAROXETINE (PAXIL CR) 12.5 MG 24 HR TABLET    Take 1 tablet (12.5 mg total) by mouth daily.  Previous Medications   CO-ENZYME Q-10 30 MG CAPSULE    Take 30 mg by mouth 3 (three) times daily.   EZETIMIBE (ZETIA) 10 MG TABLET    TAKE 1 TABLET EVERY DAY   FLUTICASONE (FLONASE) 50 MCG/ACT NASAL SPRAY    USE 2 SPRAYS IN EACH NOSTRIL AT BEDTIME   LISINOPRIL-HYDROCHLOROTHIAZIDE (ZESTORETIC) 20-12.5 MG TABLET    TAKE 1 TABLET EVERY DAY   MULTIPLE VITAMINS-MINERALS (CENTRUM SILVER ADULT 50+ PO)    Take 1 tablet by mouth daily.   OMEPRAZOLE (PRILOSEC) 20 MG CAPSULE    TAKE 1 CAPSULE EVERY DAY   TRIAMCINOLONE OINTMENT (KENALOG) 0.5 %    Apply 1 Application topically 2 (two) times daily.   VITAMIN D PO    Take by mouth.  Modified Medications   No medications on  file  Discontinued Medications   No medications on file    Allergies Allergies  Allergen Reactions   Penicillins Itching and Other (See Comments)    DID THE REACTION INVOLVE: Swelling of the face/tongue/throat, SOB, or low BP? No Sudden or severe rash/hives, skin peeling, or the inside of the mouth or nose? No Did it require medical treatment? No When did it last happen? Unknown age If all above answers are "NO", may proceed with cephalosporin  use.    Sulfa Antibiotics Itching    Past Medical History Past Medical History:  Diagnosis Date   Arthritis    Family history of adverse reaction to anesthesia    DAD-HARD TO WAKE UP   GERD (gastroesophageal reflux disease)    History of hiatal hernia    Hyperlipidemia    Hypertension     Past Surgical History Past Surgical History:  Procedure Laterality Date   ADENOIDECTOMY  1960   APPENDECTOMY  1983   BREAST BIOPSY Right 06/20/2018   stereo for distortion  SMALL COMPLEX SCLEROSING LESION   BREAST EXCISIONAL BIOPSY Right 07/22/2018   NP for  SMALL COMPLEX SCLEROSING LESION   BREAST LUMPECTOMY WITH NEEDLE LOCALIZATION Right 07/22/2018   Procedure: BREAST LUMPECTOMY WITH NEEDLE LOCALIZATION;  Surgeon: Henrene Dodge, MD;  Location: [facility];  Service: General;  Laterality: Right;   CESAREAN SECTION  [id number]   CHOLECYSTECTOMY  12/2011   COLONOSCOPY WITH PROPOFOL N/A 02/10/2021   Procedure: COLONOSCOPY WITH PROPOFOL;  Surgeon: Midge Minium, MD;  Location: [facility];  Service: Endoscopy;  Laterality: N/A;   LASIK Bilateral 2008   POLYPECTOMY  02/10/2021   Procedure: POLYPECTOMY;  Surgeon: Midge Minium, MD;  Location: [facility];  Service: Endoscopy;;   TONSILLECTOMY  1960    Family History family history includes Aneurysm in her paternal grandfather; Arthritis in her mother and sister; Breast cancer in her cousin; Colon cancer in her father; Heart attack in her maternal grandfather and paternal grandmother; Hyperlipidemia in her mother; Hypertension in her mother and sister; Thyroid cancer in her paternal grandmother.  Social History Social History   Socioeconomic History   Marital status: Married    Spouse name: Not on file   Number of children: Not on file   Years of education: Not on file   Highest education level: Not on file  Occupational History   Not on file  Tobacco Use   Smoking status: Never   Smokeless tobacco: Never  Vaping Use   Vaping  status: Never Used  Substance and Sexual Activity   Alcohol use: Yes    Alcohol/week: 0.0 standard drinks of alcohol    Comment: OCCASIONALLY   Drug use: No   Sexual activity: Not on file  Other Topics Concern   Not on file  Social History Narrative   Not on file   Social Determinants of Health   Financial Resource Strain: Low Risk  (08/23/2022)   Overall Financial Resource Strain (CARDIA)    Difficulty of Paying Living Expenses: Not hard at all  Food Insecurity: No Food Insecurity (08/23/2022)   Hunger Vital Sign    Worried About Running Out of Food in the Last Year: Never true    Ran Out of Food in the Last Year: Never true  Transportation Needs: No Transportation Needs (08/23/2022)   PRAPARE - Administrator, Civil Service (Medical): No    Lack of Transportation (Non-Medical): No  Physical Activity: Sufficiently Active (08/16/2021)   Exercise Vital Sign  Days of Exercise per Week: 5 days    Minutes of Exercise per Session: 60 min  Stress: No Stress Concern Present (08/23/2022)   [employer] of Occupational Health - Occupational Stress Questionnaire    Feeling of Stress : Not at all  Social Connections: Socially Integrated (08/23/2022)   Social Connection and Isolation Panel [NHANES]    Frequency of Communication with Friends and Family: More than three times a week    Frequency of Social Gatherings with Friends and Family: Three times a week    Attends Religious Services: More than 4 times per year    Active Member of Clubs or Organizations: Yes    Attends Club or Organization Meetings: More than 4 times per year    Marital Status: Married  Catering manager Violence: Not At Risk (08/16/2021)   Humiliation, Afraid, Rape, and Kick questionnaire    Fear of Current or Ex-Partner: No    Emotionally Abused: No    Physically Abused: No    Sexually Abused: No    Lab Results  Component Value Date   CHOL 213 (H) 12/06/2022   Lab Results  Component Value Date    HDL 76 12/06/2022   Lab Results  Component Value Date   LDLCALC 123 (H) 12/06/2022   Lab Results  Component Value Date   TRIG 79 12/06/2022   Lab Results  Component Value Date   CHOLHDL 2.8 12/06/2022   Lab Results  Component Value Date   CREATININE 0.86 12/06/2022   No results found for: "GFR"    Component Value Date/Time   NA 142 12/06/2022 0932   NA 141 12/07/2011 1056   K 5.0 12/06/2022 0932   K 4.1 12/07/2011 1056   CL 104 12/06/2022 0932   CL 107 12/07/2011 1056   CO2 24 12/06/2022 0932   CO2 29 12/07/2011 1056   GLUCOSE 108 (H) 12/06/2022 0932   GLUCOSE 102 (H) 07/22/2018 0930   GLUCOSE 94 12/07/2011 1056   BUN 18 12/06/2022 0932   BUN 11 12/07/2011 1056   CREATININE 0.86 12/06/2022 0932   CREATININE 0.66 12/07/2011 1056   CALCIUM 9.6 12/06/2022 0932   CALCIUM 9.1 12/07/2011 1056   PROT 6.6 12/06/2022 0932   ALBUMIN 4.3 12/06/2022 0932   AST 21 12/06/2022 0932   ALT 21 12/06/2022 0932   ALKPHOS 97 12/06/2022 0932   BILITOT 0.4 12/06/2022 0932   GFRNONAA 72 04/29/2020 1028   GFRNONAA >60 12/07/2011 1056   GFRAA 83 04/29/2020 1028   GFRAA >60 12/07/2011 1056      Latest Ref Rng & Units 12/06/2022    9:32 AM 11/30/2021    9:11 AM 06/27/2021    9:14 AM  BMP  Glucose 70 - 99 mg/dL 562  130  865   BUN 8 - 27 mg/dL 18  15  13    Creatinine 0.57 - 1.00 mg/dL 7.84  6.96  2.95   BUN/Creat Ratio 12 - 28 21  19  15    Sodium 134 - 144 mmol/L 142  143  143   Potassium 3.5 - 5.2 mmol/L 5.0  4.5  4.0   Chloride 96 - 106 mmol/L 104  105  107   CO2 20 - 29 mmol/L 24  24  20    Calcium 8.7 - 10.3 mg/dL 9.6  9.8  9.5        Component Value Date/Time   WBC 5.1 12/06/2022 0932   WBC 5.2 12/07/2011 1056   RBC 4.70 12/06/2022 0932  RBC 4.71 12/07/2011 1056   HGB 14.6 12/06/2022 0932   HCT 42.7 12/06/2022 0932   PLT 218 12/06/2022 0932   MCV 91 12/06/2022 0932   MCV 91 12/07/2011 1056   MCH 31.1 12/06/2022 0932   MCH 30.3 12/07/2011 1056   MCHC 34.2 12/06/2022  0932   MCHC 33.3 12/07/2011 1056   RDW 11.9 12/06/2022 0932   RDW 13.3 12/07/2011 1056   LYMPHSABS 2.4 12/06/2022 0932   LYMPHSABS 2.8 12/07/2011 1056   MONOABS 0.4 12/07/2011 1056   EOSABS 0.1 12/06/2022 0932   EOSABS 0.1 12/07/2011 1056   BASOSABS 0.0 12/06/2022 0932   BASOSABS 0.0 12/07/2011 1056   Lab Results  Component Value Date   TSH 0.874 12/06/2022   TSH 0.600 11/29/2020   TSH 0.375 (L) 04/29/2020   FREET4 1.13 12/06/2022         Parts of this note may have been dictated using voice recognition software. There may be variances in spelling and vocabulary which are unintentional. Not all errors are proofread. Please notify the Thereasa Parkin if any discrepancies are noted or if the meaning of any statement is not clear.

## 2023-04-12 ENCOUNTER — Ambulatory Visit: Payer: Medicare HMO | Admitting: Podiatry

## 2023-04-12 DIAGNOSIS — M7752 Other enthesopathy of left foot: Secondary | ICD-10-CM | POA: Diagnosis not present

## 2023-04-12 DIAGNOSIS — M7751 Other enthesopathy of right foot: Secondary | ICD-10-CM | POA: Diagnosis not present

## 2023-04-12 NOTE — Progress Notes (Signed)
Subjective:  Patient ID: Bridget Green, female    DOB: 05-15-1956,  MRN: [id number]  Chief Complaint  Patient presents with   Injections    Would like injections in both feet     67 y.o. female presents with the above complaint.  Patient presents with complaint bilateral second metatarsophalangeal joint pain.  She states that started coming back again.  She was doing pretty good for few months denies any other acute complaints she would like to do another injection.   Review of Systems: Negative except as noted in the HPI. Denies N/V/F/Ch.  Past Medical History:  Diagnosis Date   Arthritis    Family history of adverse reaction to anesthesia    DAD-HARD TO WAKE UP   GERD (gastroesophageal reflux disease)    History of hiatal hernia    Hyperlipidemia    Hypertension     Current Outpatient Medications:    co-enzyme Q-10 30 MG capsule, Take 30 mg by mouth 3 (three) times daily., Disp: , Rfl:    ezetimibe (ZETIA) 10 MG tablet, TAKE 1 TABLET EVERY DAY, Disp: 90 tablet, Rfl: 3   fluticasone (FLONASE) 50 MCG/ACT nasal spray, USE 2 SPRAYS IN EACH NOSTRIL AT BEDTIME, Disp: 48 g, Rfl: 10   lisinopril-hydrochlorothiazide (ZESTORETIC) 20-12.5 MG tablet, TAKE 1 TABLET EVERY DAY, Disp: 90 tablet, Rfl: 3   Multiple Vitamins-Minerals (CENTRUM SILVER ADULT 50+ PO), Take 1 tablet by mouth daily., Disp: , Rfl:    omeprazole (PRILOSEC) 20 MG capsule, TAKE 1 CAPSULE EVERY DAY, Disp: 90 capsule, Rfl: 3   PARoxetine (PAXIL CR) 12.5 MG 24 hr tablet, Take 1 tablet (12.5 mg total) by mouth daily., Disp: 30 tablet, Rfl: 4   triamcinolone ointment (KENALOG) 0.5 %, Apply 1 Application topically 2 (two) times daily., Disp: 30 g, Rfl: 0   VITAMIN D PO, Take by mouth., Disp: , Rfl:   Social History   Tobacco Use  Smoking Status Never  Smokeless Tobacco Never    Allergies  Allergen Reactions   Penicillins Itching and Other (See Comments)    DID THE REACTION INVOLVE: Swelling of the face/tongue/throat,  SOB, or low BP? No Sudden or severe rash/hives, skin peeling, or the inside of the mouth or nose? No Did it require medical treatment? No When did it last happen? Unknown age If all above answers are "NO", may proceed with cephalosporin use.    Sulfa Antibiotics Itching   Objective:  There were no vitals filed for this visit. There is no height or weight on file to calculate BMI. Constitutional Well developed. Well nourished.  Vascular Dorsalis pedis pulses palpable bilaterally. Posterior tibial pulses palpable bilaterally. Capillary refill normal to all digits.  No cyanosis or clubbing noted. Pedal hair growth normal.  Neurologic Normal speech. Oriented to person, place, and time. Epicritic sensation to light touch grossly present bilaterally.  Dermatologic Nails well groomed and normal in appearance. No open wounds. No skin lesions.  Orthopedic: Pain on palpation left second metatarsophalangeal joint pain with range of motion of the second metatarsophalangeal joint.  No deep intra-articular pain noted   Radiographs: 3 views of sketcher mature adult left foot:: Uneven joint space narrowing noted at the left great toe no other bony abnormalities noted midfoot arthritis noted. Assessment:   1. Capsulitis of metatarsophalangeal (MTP) joint of left foot   2. Capsulitis of metatarsophalangeal (MTP) joint of right foot     Plan:  Patient was evaluated and treated and all questions answered.  Left second metatarsophalangeal  joint capsulitis -All questions and concerns were discussed with the patient in extensive detail given the amount of pain that she is having she will benefit from steroid injection at decreasing inflammatory component associate with pain.  Patient agrees with plan to proceed with steroid injection -A second steroid injection was performed at left second MTP using 1% plain Lidocaine and 10 mg of Kenalog. This was well tolerated.   No follow-ups on file.

## 2023-04-16 NOTE — Progress Notes (Unsigned)
      Established patient visit   [name]   DOB: 07-30-1955   67 y.o. Female  MRN: [id number] Visit Date: 04/17/2023  Today's healthcare provider: Ronnald Ramp, MD   No chief complaint on file.  Subjective       Discussed the use of AI scribe software for clinical note transcription with the patient, who gave verbal consent to proceed.  History of Present Illness             Past Medical History:  Diagnosis Date   Arthritis    Family history of adverse reaction to anesthesia    DAD-HARD TO WAKE UP   GERD (gastroesophageal reflux disease)    History of hiatal hernia    Hyperlipidemia    Hypertension     Medications: Outpatient Medications Prior to Visit  Medication Sig   co-enzyme Q-10 30 MG capsule Take 30 mg by mouth 3 (three) times daily.   ezetimibe (ZETIA) 10 MG tablet TAKE 1 TABLET EVERY DAY   fluticasone (FLONASE) 50 MCG/ACT nasal spray USE 2 SPRAYS IN EACH NOSTRIL AT BEDTIME   lisinopril-hydrochlorothiazide (ZESTORETIC) 20-12.5 MG tablet TAKE 1 TABLET EVERY DAY   Multiple Vitamins-Minerals (CENTRUM SILVER ADULT 50+ PO) Take 1 tablet by mouth daily.   omeprazole (PRILOSEC) 20 MG capsule TAKE 1 CAPSULE EVERY DAY   PARoxetine (PAXIL CR) 12.5 MG 24 hr tablet Take 1 tablet (12.5 mg total) by mouth daily.   triamcinolone ointment (KENALOG) 0.5 % Apply 1 Application topically 2 (two) times daily.   VITAMIN D PO Take by mouth.   No facility-administered medications prior to visit.    Review of Systems  {Insert previous labs (optional):23779} {See past labs  Heme  Chem  Endocrine  Serology  Results Review (optional):1}   Objective    There were no vitals taken for this visit. {Insert last BP/Wt (optional):23777}{See vitals history (optional):1}   Physical Exam  ***  No results found for any visits on 04/17/23.  Assessment & Plan     Problem List Items Addressed This Visit   None   Assessment and Plan               No follow-ups on file.         Ronnald Ramp, MD  [facility] [phone] (phone) [phone] (fax)  [facility]

## 2023-04-17 ENCOUNTER — Ambulatory Visit (INDEPENDENT_AMBULATORY_CARE_PROVIDER_SITE_OTHER): Payer: Medicare HMO | Admitting: Family Medicine

## 2023-04-17 VITALS — BP 142/64 | HR 60 | Temp 98.5°F | Ht 64.0 in | Wt 183.0 lb

## 2023-04-17 DIAGNOSIS — I10 Essential (primary) hypertension: Secondary | ICD-10-CM | POA: Diagnosis not present

## 2023-04-17 DIAGNOSIS — N951 Menopausal and female climacteric states: Secondary | ICD-10-CM | POA: Diagnosis not present

## 2023-04-17 DIAGNOSIS — R252 Cramp and spasm: Secondary | ICD-10-CM | POA: Diagnosis not present

## 2023-04-17 DIAGNOSIS — E782 Mixed hyperlipidemia: Secondary | ICD-10-CM | POA: Diagnosis not present

## 2023-04-17 DIAGNOSIS — Z23 Encounter for immunization: Secondary | ICD-10-CM | POA: Diagnosis not present

## 2023-04-17 MED ORDER — VEOZAH 45 MG PO TABS
45.0000 mg | ORAL_TABLET | Freq: Every day | ORAL | Status: DC
Start: 2023-04-17 — End: 2023-05-21

## 2023-04-17 MED ORDER — LISINOPRIL 40 MG PO TABS: 40.0000 mg | ORAL_TABLET | Freq: Every day | ORAL | 3 refills | Status: DC

## 2023-04-17 NOTE — Patient Instructions (Signed)
VISIT SUMMARY:  During your visit, we discussed your ongoing muscle cramps in your arms and hands, worsening hot flashes, slightly elevated blood pressure, and difficulty losing weight. We also reviewed your current medications, including Zetia for cholesterol and Zestoretic for hypertension.  YOUR PLAN:  -MUSCLE CRAMPS: Your muscle cramps might be due to an imbalance in your body's salts (electrolytes) caused by one of your blood pressure medications. We will check your electrolyte levels with a blood test and consider changing your medication.  -MENOPAUSAL SYMPTOMS: Your hot flashes, a common symptom of menopause, have worsened. We will consider trying a new medication, Veozah, to help manage these symptoms.  -HYPERTENSION: Your blood pressure is slightly above the target range. We will adjust your medication by increasing the dose of lisinopril and discontinuing hydrochlorothiazide.  -HYPERLIPIDEMIA: Your cholesterol levels are being managed with Zetia. We will continue this medication as it is.  -WEIGHT MANAGEMENT: Despite your efforts, you're having difficulty losing weight. We encourage you to continue your current diet and exercise routine and suggest adding resistance training to help further weight loss.  INSTRUCTIONS:  We will follow up in November 2024 to reassess your blood pressure control, muscle cramps, and menopausal symptoms. Please continue to take your medications as prescribed and maintain your exercise and diet regimen.

## 2023-04-17 NOTE — Assessment & Plan Note (Signed)
Frequent muscle cramps in arms and hands, worse on the right side, for the past six months. No associated numbness or tingling. Possible electrolyte imbalance due to hydrochlorothiazide component of Zestoretic. -Order CMP to check electrolyte levels including magnesium. -Consider discontinuing hydrochlorothiazide component of Zestoretic.

## 2023-04-17 NOTE — Assessment & Plan Note (Signed)
Blood pressure readings consistently above goal (130/80), despite current treatment with Zestoretic (lisinopril 20mg /hydrochlorothiazide 12.5mg ). Chronic -Double lisinopril dose to 40mg  daily, discontinue hydrochlorothiazide CMP ordered today

## 2023-04-17 NOTE — Assessment & Plan Note (Signed)
Frequent hot flashes, exacerbated by heat and physical activity. Previous trial of Paxil was poorly tolerated. -Chronic, not well controlled  -DC paxil as pt is not taking  -recommended endo follow up, pt will consider  - Veozah 45mg  daily, 7 day supply of samples given

## 2023-04-17 NOTE — Assessment & Plan Note (Signed)
Currently managed with Zetia 10mg  daily. Chronic  -Continue current regimen.

## 2023-04-18 LAB — COMPREHENSIVE METABOLIC PANEL
ALT: 23 [IU]/L (ref 0–32)
AST: 19 [IU]/L (ref 0–40)
Albumin: 4.5 g/dL (ref 3.9–4.9)
Alkaline Phosphatase: 98 [IU]/L (ref 44–121)
BUN/Creatinine Ratio: 18 (ref 12–28)
BUN: 16 mg/dL (ref 8–27)
Bilirubin Total: 0.6 mg/dL (ref 0.0–1.2)
CO2: 23 mmol/L (ref 20–29)
Calcium: 10.2 mg/dL (ref 8.7–10.3)
Chloride: 104 mmol/L (ref 96–106)
Creatinine, Ser: 0.91 mg/dL (ref 0.57–1.00)
Globulin, Total: 2.5 g/dL (ref 1.5–4.5)
Glucose: 96 mg/dL (ref 70–99)
Potassium: 4.6 mmol/L (ref 3.5–5.2)
Sodium: 144 mmol/L (ref 134–144)
Total Protein: 7 g/dL (ref 6.0–8.5)
eGFR: 68 mL/min/{1.73_m2} (ref >59)

## 2023-04-18 LAB — CK: Total CK: 65 U/L (ref 32–182)

## 2023-04-18 LAB — MAGNESIUM: Magnesium: 2.3 mg/dL (ref 1.6–2.3)

## 2023-04-28 ENCOUNTER — Other Ambulatory Visit: Payer: Self-pay | Admitting: Physician Assistant

## 2023-04-28 DIAGNOSIS — E7800 Pure hypercholesterolemia, unspecified: Secondary | ICD-10-CM

## 2023-04-30 NOTE — Telephone Encounter (Signed)
Requested Prescriptions  Pending Prescriptions Disp Refills   ezetimibe (ZETIA) 10 MG tablet [Pharmacy Med Name: Ezetimibe Oral Tablet 10 MG] 90 tablet 3    Sig: TAKE 1 TABLET EVERY DAY     Cardiovascular:  Antilipid - Sterol Transport Inhibitors Failed - 04/28/2023  5:27 AM      Failed - Lipid Panel in normal range within the last 12 months    Cholesterol, Total  Date Value Ref Range Status  12/06/2022 213 (H) 100 - 199 mg/dL Final   LDL Chol Calc (NIH)  Date Value Ref Range Status  12/06/2022 123 (H) 0 - 99 mg/dL Final   HDL  Date Value Ref Range Status  12/06/2022 76 >39 mg/dL Final   Triglycerides  Date Value Ref Range Status  12/06/2022 79 0 - 149 mg/dL Final         Passed - AST in normal range and within 360 days    AST  Date Value Ref Range Status  04/17/2023 19 0 - 40 IU/L Final         Passed - ALT in normal range and within 360 days    ALT  Date Value Ref Range Status  04/17/2023 23 0 - 32 IU/L Final         Passed - Patient is not pregnant      Passed - Valid encounter within last 12 months    Recent Outpatient Visits           1 week ago Essential (primary) hypertension   [place] [facility] Simmons-Robinson, [place], MD   4 months ago Annual physical exam   [place] [facility] Alfredia Ferguson, PA-C   10 months ago Essential (primary) hypertension   [place] [facility] Edwards, [place], PA-C   1 year ago Encounter for health maintenance examination   [facility] Alfredia Ferguson, PA-C   1 year ago Elevated lipoprotein(a)   [place] [facility] Alfredia Ferguson, PA-C       Future Appointments             In 3 weeks Simmons-Robinson, Tawanna Cooler, MD [facility]

## 2023-05-18 NOTE — Progress Notes (Unsigned)
      Established patient visit   [name]   DOB: [birth year]   67 y.o. Female  MRN: [id number] Visit Date: 05/21/2023  Today's healthcare provider: Ronnald Ramp, MD   No chief complaint on file.  Subjective       Discussed the use of AI scribe software for clinical note transcription with the patient, who gave verbal consent to proceed.  History of Present Illness             Past Medical History:  Diagnosis Date   Arthritis    Family history of adverse reaction to anesthesia    DAD-HARD TO WAKE UP   GERD (gastroesophageal reflux disease)    History of hiatal hernia    Hyperlipidemia    Hypertension     Medications: Outpatient Medications Prior to Visit  Medication Sig   co-enzyme Q-10 30 MG capsule Take 30 mg by mouth 3 (three) times daily.   ezetimibe (ZETIA) 10 MG tablet TAKE 1 TABLET EVERY DAY   Fezolinetant (VEOZAH) 45 MG TABS Take 1 tablet (45 mg total) by mouth daily.   fluticasone (FLONASE) 50 MCG/ACT nasal spray USE 2 SPRAYS IN EACH NOSTRIL AT BEDTIME   lisinopril (ZESTRIL) 40 MG tablet Take 1 tablet (40 mg total) by mouth daily.   Multiple Vitamins-Minerals (CENTRUM SILVER ADULT 50+ PO) Take 1 tablet by mouth daily.   omeprazole (PRILOSEC) 20 MG capsule TAKE 1 CAPSULE EVERY DAY   VITAMIN D PO Take by mouth.   No facility-administered medications prior to visit.    Review of Systems  {Insert previous labs (optional):23779} {See past labs  Heme  Chem  Endocrine  Serology  Results Review (optional):1}   Objective    There were no vitals taken for this visit. {Insert last BP/Wt (optional):23777}{See vitals history (optional):1}      Physical Exam  ***  No results found for any visits on 05/21/23.  Assessment & Plan     Problem List Items Addressed This Visit   None   Assessment and Plan              No follow-ups on file.         Ronnald Ramp, MD  [facility] [phone] (phone) [phone] (fax)  [facility]

## 2023-05-21 ENCOUNTER — Ambulatory Visit (INDEPENDENT_AMBULATORY_CARE_PROVIDER_SITE_OTHER): Payer: Medicare HMO | Admitting: Family Medicine

## 2023-05-21 ENCOUNTER — Encounter: Payer: Self-pay | Admitting: Family Medicine

## 2023-05-21 VITALS — BP 130/80 | HR 61 | Resp 16 | Ht 64.0 in | Wt 185.2 lb

## 2023-05-21 DIAGNOSIS — I10 Essential (primary) hypertension: Secondary | ICD-10-CM

## 2023-05-21 DIAGNOSIS — N951 Menopausal and female climacteric states: Secondary | ICD-10-CM

## 2023-05-21 NOTE — Assessment & Plan Note (Signed)
Chronic problem with improvement noted with Veozah 45 mg, discontinued due to insurance issues. Symptoms currently managed by cooler weather. Discussed alternative treatments if symptoms worsen. - Discuss alternative treatments if symptoms worsen

## 2023-05-21 NOTE — Assessment & Plan Note (Signed)
Chronic problem, improved  Blood pressure 137/80 mmHg on lisinopril 40 mg daily. Hydrochlorothiazide discontinued due to hand cramps, which have improved. Occasional muscle cramps reported. Discussed long-term use of lisinopril and its effectiveness. Prefers to continue current regimen. - Continue lisinopril 40 mg daily - Order BMP to check potassium and creatinine levels

## 2023-05-22 LAB — BMP8+EGFR
BUN/Creatinine Ratio: 10 — ABNORMAL LOW (ref 12–28)
BUN: 9 mg/dL (ref 8–27)
CO2: 22 mmol/L (ref 20–29)
Calcium: 9.5 mg/dL (ref 8.7–10.3)
Chloride: 108 mmol/L — ABNORMAL HIGH (ref 96–106)
Creatinine, Ser: 0.87 mg/dL (ref 0.57–1.00)
Glucose: 93 mg/dL (ref 70–99)
Potassium: 4.7 mmol/L (ref 3.5–5.2)
Sodium: 143 mmol/L (ref 134–144)
eGFR: 72 mL/min/{1.73_m2} (ref >59)

## 2023-05-29 ENCOUNTER — Encounter: Payer: Self-pay | Admitting: *Deleted

## 2023-05-29 ENCOUNTER — Ambulatory Visit: Payer: Self-pay

## 2023-05-29 NOTE — Telephone Encounter (Signed)
  Chief Complaint: BP 172/60 past weekend was not feeling well and BP 205/9?Marland Kitchen  Symptoms: none today Frequency: elevation this past weekend and 1 this am  Pertinent Negatives: Patient denies blurred vision, chest pain, SOB, headache, weakness Disposition: [] ED /[] Urgent Care (no appt availability in office) / [] Appointment(In office/virtual)/ []  [place] [facility]/ [] Home Care/ [x] Refused Recommended Disposition /[] [place] [facility]/ []  Follow-up with PCP Additional Notes: refused to see anyone other than provider. Pt stated she will go to a UC. Offered other providers and pt refused. Pt stated she will call back if needed to make appt.  Reason for Disposition  Systolic BP  >= 160 OR Diastolic >= 100  Answer Assessment - Initial Assessment Questions 1. BLOOD PRESSURE: "What is the blood pressure?" "Did you take at least two measurements 5 minutes apart?"     172/66 2. ONSET: "When did you take your blood pressure?"     0800 3. HOW: "How did you take your blood pressure?" (e.g., automatic home BP monitor, visiting nurse)     Auto monitor 4. HISTORY: "Do you have a history of high blood pressure?"     yes 5. MEDICINES: "Are you taking any medicines for blood pressure?" "Have you missed any doses recently?"     Recent change in medication 6. OTHER SYMPTOMS: "Do you have any symptoms?" (e.g., blurred vision, chest pain, difficulty breathing, headache, weakness)     no 7. PREGNANCY: "Is there any chance you are pregnant?" "When was your last menstrual period?"     N/a  Protocols used: Blood Pressure - High-A-AH

## 2023-05-29 NOTE — Telephone Encounter (Signed)
This encounter was created in error - please disregard.see previous encounter

## 2023-05-29 NOTE — Telephone Encounter (Signed)
Patient returned call to report BP higher 186/92. Asymptomatic. Did report feeling weak after further questioning by NT. Patient rechecked BP for 193/90 and then 193/95 sitting. Reports PCP has changed her medication and discontinued hydrochlorothiazide from medication regimen due to cramping. Recommended patient drink couple of glasses of water with the next few hours , recheck BP and if greater than 180 / 100 call back and if sx noted,  go to ED. Appt scheduled for tomorrow with PCP. Please advise for further recommendations.

## 2023-05-30 ENCOUNTER — Encounter: Payer: Self-pay | Admitting: Family Medicine

## 2023-05-30 ENCOUNTER — Ambulatory Visit: Payer: Medicare HMO | Admitting: "Endocrinology

## 2023-05-30 ENCOUNTER — Ambulatory Visit (INDEPENDENT_AMBULATORY_CARE_PROVIDER_SITE_OTHER): Payer: Medicare HMO | Admitting: Family Medicine

## 2023-05-30 VITALS — BP 142/68 | HR 64 | Resp 16 | Ht 64.0 in | Wt 184.0 lb

## 2023-05-30 DIAGNOSIS — I10 Essential (primary) hypertension: Secondary | ICD-10-CM | POA: Diagnosis not present

## 2023-05-30 MED ORDER — AMLODIPINE BESYLATE 5 MG PO TABS: 5.0000 mg | ORAL_TABLET | Freq: Every day | ORAL | 1 refills | Status: DC

## 2023-05-30 NOTE — Telephone Encounter (Signed)
Reviewed will adjust blood pressure medications during appt

## 2023-05-30 NOTE — Progress Notes (Signed)
Established patient visit   [name]   DOB: 17-Sep-1955   67 y.o. Female  MRN: [id number] Visit Date: 05/30/2023  Today's healthcare provider: Ronnald Ramp, MD   Chief Complaint  Patient presents with   Hypertension   Subjective       Discussed the use of AI scribe software for clinical note transcription with the patient, who gave verbal consent to proceed.  History of Present Illness   The patient is a 67 year old female with a history of hypertension who presents with elevated blood pressure readings over the past few days, with systolic values exceeding 180. The patient was recently taken off hydrochlorothiazide due to hand cramps, which have since improved. However, the patient's blood pressure has been more elevated since discontinuing the medication. The patient reports feeling unwell when the blood pressure rises, with symptoms of fatigue and a need to sleep during the day. The patient also reports feeling significantly worse when the blood pressure reaches 190 or above. The patient has been monitoring blood pressure at home, with readings consistently in the 180s and even up to 200. The patient has been adhering to the prescribed lisinopril regimen. The patient's husband is also present during the consultation and contributes to the discussion.      Past Medical History:  Diagnosis Date   Arthritis    Family history of adverse reaction to anesthesia    DAD-HARD TO WAKE UP   GERD (gastroesophageal reflux disease)    History of hiatal hernia    Hyperlipidemia    Hypertension     Medications: Outpatient Medications Prior to Visit  Medication Sig   co-enzyme Q-10 30 MG capsule Take 30 mg by mouth 3 (three) times daily.   ezetimibe (ZETIA) 10 MG tablet TAKE 1 TABLET EVERY DAY   fluticasone (FLONASE) 50 MCG/ACT nasal spray USE 2 SPRAYS IN EACH NOSTRIL AT BEDTIME   lisinopril (ZESTRIL) 40 MG tablet Take 1 tablet (40 mg total) by mouth daily.    Multiple Vitamins-Minerals (CENTRUM SILVER ADULT 50+ PO) Take 1 tablet by mouth daily.   omeprazole (PRILOSEC) 20 MG capsule TAKE 1 CAPSULE EVERY DAY   VITAMIN D PO Take by mouth.   No facility-administered medications prior to visit.    Review of Systems  Last CBC Lab Results  Component Value Date   WBC 5.1 12/06/2022   HGB 14.6 12/06/2022   HCT 42.7 12/06/2022   MCV 91 12/06/2022   MCH 31.1 12/06/2022   RDW 11.9 12/06/2022   PLT 218 12/06/2022   Last metabolic panel Lab Results  Component Value Date   GLUCOSE 93 05/21/2023   NA 143 05/21/2023   K 4.7 05/21/2023   CL 108 (H) 05/21/2023   CO2 22 05/21/2023   BUN 9 05/21/2023   CREATININE 0.87 05/21/2023   EGFR 72 05/21/2023   CALCIUM 9.5 05/21/2023   PROT 7.0 04/17/2023   ALBUMIN 4.5 04/17/2023   LABGLOB 2.5 04/17/2023   AGRATIO 1.9 12/06/2022   BILITOT 0.6 04/17/2023   ALKPHOS 98 04/17/2023   AST 19 04/17/2023   ALT 23 04/17/2023   ANIONGAP 5 (L) 12/07/2011   Last lipids Lab Results  Component Value Date   CHOL 213 (H) 12/06/2022   HDL 76 12/06/2022   LDLCALC 123 (H) 12/06/2022   TRIG 79 12/06/2022   CHOLHDL 2.8 12/06/2022   Last hemoglobin A1c Lab Results  Component Value Date   HGBA1C 5.9 (H) 12/06/2022   Last thyroid functions  Lab Results  Component Value Date   TSH 0.874 12/06/2022        Objective    BP (!) 142/68   Pulse 64   Resp 16   Ht 5\' 4"  (1.626 m)   Wt 184 lb (83.5 kg)   SpO2 100%   BMI 31.58 kg/m  BP Readings from Last 3 Encounters:  05/30/23 (!) 142/68  05/21/23 130/80  04/17/23 (!) 142/64   Wt Readings from Last 3 Encounters:  05/30/23 184 lb (83.5 kg)  05/21/23 185 lb 3.2 oz (84 kg)  04/17/23 183 lb (83 kg)       Physical Exam  General: Alert, no acute distress Cardio: Normal S1 and S2, RRR, no r/m/g Pulm: CTAB, normal work of breathing Abdomen: Bowel sounds normal. Abdomen soft and non-tender.    No results found for any visits on 05/30/23.  Assessment  & Plan     Problem List Items Addressed This Visit       Cardiovascular and Mediastinum   Essential (primary) hypertension - Primary    Hypertension with recent systolic readings exceeding 180 mmHg. Current blood pressure is 163/73 mmHg. Previously on hydrochlorothiazide 12.5 mg, discontinued due to hand cramps. Currently on lisinopril, but additional medication needed. Amlodipine chosen for its efficacy and lower risk of electrolyte disturbances. Potential side effects include headaches and leg swelling. Shared decision-making led to agreement to start amlodipine. Emphasized monitoring symptoms and blood pressure at home. Explained amlodipine's mechanism and the need to avoid rapid blood pressure reduction. Patient prefers to avoid multiple medications and agreed to replace hydrochlorothiazide with amlodipine. - Chronic  - Start amlodipine 5 mg daily - Continue lisinopril 40 mg daily - Monitor blood pressure at home, focusing on symptoms and readings - Schedule a virtual follow-up visit in one week to review blood pressure readings and symptoms.       Relevant Medications   amLODipine (NORVASC) 5 MG tablet      Return in about 1 week (around 06/06/2023) for HTN.         Ronnald Ramp, MD  [facility] [phone] (phone) [phone] (fax)  [facility]

## 2023-05-30 NOTE — Patient Instructions (Signed)
VISIT SUMMARY:  Today, we discussed your recent elevated blood pressure readings and symptoms. We reviewed your current medications and made adjustments to better manage your hypertension.  YOUR PLAN:  -HYPERTENSION: Hypertension, or high blood pressure, means that the force of the blood against your artery walls is too high, which can lead to health problems. Your recent readings have been very high, so we are adding a new medication called amlodipine to help lower your blood pressure. You will start taking amlodipine 5 mg daily in addition to continuing your current dose of lisinopril 40 mg daily. Please monitor your blood pressure at home and keep track of your symptoms. We will have a virtual follow-up visit in one week to review your readings and how you are feeling.  INSTRUCTIONS:  Please start taking amlodipine 5 mg daily along with your current lisinopril 40 mg daily. Monitor your blood pressure at home and note any symptoms you experience.   We will have a virtual follow-up visit in one week to review your blood pressure readings and symptoms.

## 2023-05-30 NOTE — Assessment & Plan Note (Signed)
Hypertension with recent systolic readings exceeding 180 mmHg. Current blood pressure is 163/73 mmHg. Previously on hydrochlorothiazide 12.5 mg, discontinued due to hand cramps. Currently on lisinopril, but additional medication needed. Amlodipine chosen for its efficacy and lower risk of electrolyte disturbances. Potential side effects include headaches and leg swelling. Shared decision-making led to agreement to start amlodipine. Emphasized monitoring symptoms and blood pressure at home. Explained amlodipine's mechanism and the need to avoid rapid blood pressure reduction. Patient prefers to avoid multiple medications and agreed to replace hydrochlorothiazide with amlodipine. - Chronic  - Start amlodipine 5 mg daily - Continue lisinopril 40 mg daily - Monitor blood pressure at home, focusing on symptoms and readings - Schedule a virtual follow-up visit in one week to review blood pressure readings and symptoms.

## 2023-06-03 NOTE — Progress Notes (Unsigned)
      Established patient visit   [name]   DOB: [birth year]   67 y.o. Female  MRN: [id number] Visit Date: 06/04/2023  Today's healthcare provider: Ronnald Ramp, MD   No chief complaint on file.  Subjective       Discussed the use of AI scribe software for clinical note transcription with the patient, who gave verbal consent to proceed.  History of Present Illness             Past Medical History:  Diagnosis Date   Arthritis    Family history of adverse reaction to anesthesia    DAD-HARD TO WAKE UP   GERD (gastroesophageal reflux disease)    History of hiatal hernia    Hyperlipidemia    Hypertension     Medications: Outpatient Medications Prior to Visit  Medication Sig   amLODipine (NORVASC) 5 MG tablet Take 1 tablet (5 mg total) by mouth daily.   co-enzyme Q-10 30 MG capsule Take 30 mg by mouth 3 (three) times daily.   ezetimibe (ZETIA) 10 MG tablet TAKE 1 TABLET EVERY DAY   fluticasone (FLONASE) 50 MCG/ACT nasal spray USE 2 SPRAYS IN EACH NOSTRIL AT BEDTIME   lisinopril (ZESTRIL) 40 MG tablet Take 1 tablet (40 mg total) by mouth daily.   Multiple Vitamins-Minerals (CENTRUM SILVER ADULT 50+ PO) Take 1 tablet by mouth daily.   omeprazole (PRILOSEC) 20 MG capsule TAKE 1 CAPSULE EVERY DAY   VITAMIN D PO Take by mouth.   No facility-administered medications prior to visit.    Review of Systems  Last metabolic panel Lab Results  Component Value Date   GLUCOSE 93 05/21/2023   NA 143 05/21/2023   K 4.7 05/21/2023   CL 108 (H) 05/21/2023   CO2 22 05/21/2023   BUN 9 05/21/2023   CREATININE 0.87 05/21/2023   EGFR 72 05/21/2023   CALCIUM 9.5 05/21/2023   PROT 7.0 04/17/2023   ALBUMIN 4.5 04/17/2023   LABGLOB 2.5 04/17/2023   AGRATIO 1.9 12/06/2022   BILITOT 0.6 04/17/2023   ALKPHOS 98 04/17/2023   AST 19 04/17/2023   ALT 23 04/17/2023   ANIONGAP 5 (L) 12/07/2011     {See past labs  Heme  Chem  Endocrine  Serology   Results Review (optional):1}   Objective    There were no vitals taken for this visit. BP Readings from Last 3 Encounters:  05/30/23 (!) 142/68  05/21/23 130/80  04/17/23 (!) 142/64   Wt Readings from Last 3 Encounters:  05/30/23 184 lb (83.5 kg)  05/21/23 185 lb 3.2 oz (84 kg)  04/17/23 183 lb (83 kg)    {See vitals history (optional):1}    Physical Exam  ***  No results found for any visits on 06/04/23.  Assessment & Plan     Problem List Items Addressed This Visit   None   Assessment and Plan              No follow-ups on file.         Ronnald Ramp, MD  [facility] [phone] (phone) [phone] (fax)  [facility]

## 2023-06-04 ENCOUNTER — Encounter: Payer: Self-pay | Admitting: Family Medicine

## 2023-06-04 ENCOUNTER — Telehealth: Payer: Medicare HMO | Admitting: Family Medicine

## 2023-06-04 DIAGNOSIS — I10 Essential (primary) hypertension: Secondary | ICD-10-CM

## 2023-06-04 NOTE — Assessment & Plan Note (Signed)
Hypertension,chronic Persistent elevated blood pressure despite current medication regimen. Blood pressure readings have fluctuated between 137/257 and 195/101. Current medications include lisinopril 40 mg and amlodipine 5 mg. Reports feeling fine overall but experienced discomfort when blood pressure was in the 190s. Monitoring blood pressure at home and using a gym without significant spikes. Discussed potential need for medication adjustment if blood pressure remains high. Risks of hypertension include stroke and myocardial infarction. Benefits of medication adjustment include better blood pressure control and reduced risk of complications. Discussed the option of adding a diuretic if needed, considering potassium levels. - Continue lisinopril 40 mg daily - Continue amlodipine 5 mg daily - If blood pressure remains in the high 150s to 160s, increase amlodipine to 10 mg daily (two 5 mg tablets) after three days - Monitor blood pressure using both patient's and mother's cuffs for comparison - Schedule follow-up appointment on February 3rd at 9:40 AM - If blood pressure exceeds 200, go to the emergency room - Send a message via MyChart if blood pressure remains high for further medication adjustment.

## 2023-08-10 NOTE — Progress Notes (Unsigned)
      Established patient visit   [name]   DOB: 1956-04-13   68 y.o. Female  MRN: [id number] Visit Date: 08/13/2023  Today's healthcare provider: Ronnald Ramp, MD   No chief complaint on file.  Subjective       Discussed the use of AI scribe software for clinical note transcription with the patient, who gave verbal consent to proceed.  History of Present Illness             Past Medical History:  Diagnosis Date   Arthritis    Family history of adverse reaction to anesthesia    DAD-HARD TO WAKE UP   GERD (gastroesophageal reflux disease)    History of hiatal hernia    Hyperlipidemia    Hypertension     Medications: Outpatient Medications Prior to Visit  Medication Sig   amLODipine (NORVASC) 5 MG tablet Take 1 tablet (5 mg total) by mouth daily.   co-enzyme Q-10 30 MG capsule Take 30 mg by mouth 3 (three) times daily.   ezetimibe (ZETIA) 10 MG tablet TAKE 1 TABLET EVERY DAY   fluticasone (FLONASE) 50 MCG/ACT nasal spray USE 2 SPRAYS IN EACH NOSTRIL AT BEDTIME   lisinopril (ZESTRIL) 40 MG tablet Take 1 tablet (40 mg total) by mouth daily.   Multiple Vitamins-Minerals (CENTRUM SILVER ADULT 50+ PO) Take 1 tablet by mouth daily.   omeprazole (PRILOSEC) 20 MG capsule TAKE 1 CAPSULE EVERY DAY   VITAMIN D PO Take by mouth.   No facility-administered medications prior to visit.    Review of Systems  {Insert previous labs (optional):23779} {See past labs  Heme  Chem  Endocrine  Serology  Results Review (optional):1}   Objective    There were no vitals taken for this visit. {Insert last BP/Wt (optional):23777}{See vitals history (optional):1}    Physical Exam  ***  No results found for any visits on 08/13/23.  Assessment & Plan     Problem List Items Addressed This Visit   None   Assessment and Plan              No follow-ups on file.         Ronnald Ramp, MD  [facility] [phone] (phone) [phone] (fax)  [facility]

## 2023-08-13 ENCOUNTER — Ambulatory Visit (INDEPENDENT_AMBULATORY_CARE_PROVIDER_SITE_OTHER): Payer: Medicare HMO | Admitting: Family Medicine

## 2023-08-13 ENCOUNTER — Encounter: Payer: Self-pay | Admitting: Family Medicine

## 2023-08-13 VITALS — BP 134/62 | HR 82 | Ht 64.0 in | Wt 189.2 lb

## 2023-08-13 DIAGNOSIS — E6609 Other obesity due to excess calories: Secondary | ICD-10-CM | POA: Insufficient documentation

## 2023-08-13 DIAGNOSIS — I10 Essential (primary) hypertension: Secondary | ICD-10-CM

## 2023-08-13 DIAGNOSIS — R635 Abnormal weight gain: Secondary | ICD-10-CM | POA: Insufficient documentation

## 2023-08-13 DIAGNOSIS — K219 Gastro-esophageal reflux disease without esophagitis: Secondary | ICD-10-CM | POA: Diagnosis not present

## 2023-08-13 DIAGNOSIS — Z6832 Body mass index (BMI) 32.0-32.9, adult: Secondary | ICD-10-CM

## 2023-08-13 DIAGNOSIS — Z8719 Personal history of other diseases of the digestive system: Secondary | ICD-10-CM

## 2023-08-13 DIAGNOSIS — K30 Functional dyspepsia: Secondary | ICD-10-CM | POA: Diagnosis not present

## 2023-08-13 DIAGNOSIS — E66811 Obesity, class 1: Secondary | ICD-10-CM

## 2023-08-13 MED ORDER — LISINOPRIL-HYDROCHLOROTHIAZIDE 20-12.5 MG PO TABS: 1.0000 | ORAL_TABLET | Freq: Every day | ORAL | 3 refills | Status: AC

## 2023-08-13 NOTE — Assessment & Plan Note (Signed)
Chronic hypertension, well-controlled on current regimen. Blood pressure today is 134/62 mmHg. Previously experienced bilateral foot edema and indigestion with amlodipine, leading to discontinuation. Restarted lisinopril 20 mg/hydrochlorothiazide 12.5 mg daily with no symptoms. Decision to discontinue amlodipine due to side effects and continue current regimen discussed.   - Continue lisinopril 20 mg/hydrochlorothiazide 12.5 mg daily   - Discontinue amlodipine 5 mg

## 2023-08-13 NOTE — Assessment & Plan Note (Signed)
BMI of 32. Reports 5-pound weight gain due to decreased outdoor activity and increased food intake. Currently walking 3 miles daily, Monday through Friday, but not seeing weight loss. Discussed potential use of weight loss medications (Wegovy, Zepbound) and their side effects (nausea, constipation, diarrhea). Patient prefers to focus on diet and exercise for now. Discussed need for a well-balanced diet and option to reconsider medications if no progress by next visit.   - Reassess weight and discuss further options at next physical in May

## 2023-08-23 ENCOUNTER — Other Ambulatory Visit: Payer: Self-pay | Admitting: Family Medicine

## 2023-08-23 ENCOUNTER — Encounter: Payer: Self-pay | Admitting: Family Medicine

## 2023-08-23 DIAGNOSIS — Z1231 Encounter for screening mammogram for malignant neoplasm of breast: Secondary | ICD-10-CM

## 2023-08-28 ENCOUNTER — Ambulatory Visit (INDEPENDENT_AMBULATORY_CARE_PROVIDER_SITE_OTHER): Payer: Medicare HMO

## 2023-08-28 DIAGNOSIS — Z0000 Encounter for general adult medical examination without abnormal findings: Secondary | ICD-10-CM | POA: Diagnosis not present

## 2023-08-28 NOTE — Patient Instructions (Addendum)
Ms. Kaney , Thank you for taking time to come for your Medicare Wellness Visit. I appreciate your ongoing commitment to your health goals. Please review the following plan we discussed and let me know if I can assist you in the future.   Referrals/Orders/Follow-Ups/Clinician Recommendations: NONE  This is a list of the screening recommended for you and due dates:  Health Maintenance  Topic Date Due   COVID-19 Vaccine (3 - 2024-25 season) 03/11/2023   Mammogram  09/01/2023   Medicare Annual Wellness Visit  08/27/2024   DTaP/Tdap/Td vaccine (2 - Td or Tdap) 03/25/2025   Colon Cancer Screening  02/10/2026   Pneumonia Vaccine  Completed   Flu Shot  Completed   DEXA scan (bone density measurement)  Completed   Hepatitis C Screening  Completed   Zoster (Shingles) Vaccine  Completed   HPV Vaccine  Aged Out    Advanced directives: (ACP Link)Information on Advanced Care Planning can be found at [facility] [place] Advance Health Care Directives Advance Health Care Directives ([email or web address]   Next Medicare Annual Wellness Visit scheduled for next year: Yes   09/02/24 @ 1:10 PM BY PHONE

## 2023-08-28 NOTE — Progress Notes (Signed)
Subjective:   Bridget Green is a 68 y.o. female who presents for Medicare Annual (Subsequent) preventive examination.  Visit Complete: Virtual I connected with  Bridget Green on 08/28/23 by a audio enabled telemedicine application and verified that I am speaking with the correct person using two identifiers.  This patient declined Interactive audio and Acupuncturist. Therefore the visit was completed with audio only.   Patient Location: Home  Provider Location: Office/Clinic  I discussed the limitations of evaluation and management by telemedicine. The patient expressed understanding and agreed to proceed.  Vital Signs: Because this visit was a virtual/telehealth visit, some criteria may be missing or patient reported. Any vitals not documented were not able to be obtained and vitals that have been documented are patient reported.  Cardiac Risk Factors include: advanced age (>25men, >19 women);dyslipidemia;hypertension;obesity (BMI >30kg/m2)     Objective:    There were no vitals filed for this visit. There is no height or weight on file to calculate BMI.     08/28/2023    2:32 PM 08/23/2022    2:21 PM 08/16/2021    3:41 PM 02/10/2021    9:26 AM 07/19/2018   11:34 AM  Advanced Directives  Does Patient Have a Medical Advance Directive? No No No No No  Type of Advance Directive    Healthcare Power of Attorney   Would patient like information on creating a medical advance directive? No - Patient declined No - Patient declined No - Patient declined No - Patient declined     Current Medications (verified) Outpatient Encounter Medications as of 08/28/2023  Medication Sig   co-enzyme Q-10 30 MG capsule Take 30 mg by mouth 3 (three) times daily.   ezetimibe (ZETIA) 10 MG tablet TAKE 1 TABLET EVERY DAY   fluticasone (FLONASE) 50 MCG/ACT nasal spray USE 2 SPRAYS IN EACH NOSTRIL AT BEDTIME   lisinopril-hydrochlorothiazide (ZESTORETIC) 20-12.5 MG tablet Take 1 tablet by  mouth daily.   Multiple Vitamins-Minerals (CENTRUM SILVER ADULT 50+ PO) Take 1 tablet by mouth daily.   omeprazole (PRILOSEC) 20 MG capsule TAKE 1 CAPSULE EVERY DAY   VITAMIN D PO Take by mouth.   No facility-administered encounter medications on file as of 08/28/2023.    Allergies (verified) Penicillins and Sulfa antibiotics   History: Past Medical History:  Diagnosis Date   Arthritis    Family history of adverse reaction to anesthesia    DAD-HARD TO WAKE UP   GERD (gastroesophageal reflux disease)    History of hiatal hernia    Hyperlipidemia    Hypertension    Past Surgical History:  Procedure Laterality Date   ADENOIDECTOMY  1960   APPENDECTOMY  1983   BREAST BIOPSY Right 06/20/2018   stereo for distortion  SMALL COMPLEX SCLEROSING LESION   BREAST EXCISIONAL BIOPSY Right 07/22/2018   NP for  SMALL COMPLEX SCLEROSING LESION   BREAST LUMPECTOMY WITH NEEDLE LOCALIZATION Right 07/22/2018   Procedure: BREAST LUMPECTOMY WITH NEEDLE LOCALIZATION;  Surgeon: Henrene Dodge, MD;  Location: [facility];  Service: General;  Laterality: Right;   CESAREAN SECTION  8119,1478   CHOLECYSTECTOMY  12/2011   COLONOSCOPY WITH PROPOFOL N/A 02/10/2021   Procedure: COLONOSCOPY WITH PROPOFOL;  Surgeon: Midge Minium, MD;  Location: [facility];  Service: Endoscopy;  Laterality: N/A;   LASIK Bilateral 2008   POLYPECTOMY  02/10/2021   Procedure: POLYPECTOMY;  Surgeon: Midge Minium, MD;  Location: [facility];  Service: Endoscopy;;   TONSILLECTOMY  1960   Family  History  Problem Relation Age of Onset   Colon cancer Father    Arthritis Sister    Hypertension Sister    Heart attack Maternal Grandfather    Heart attack Paternal Grandmother    Thyroid cancer Paternal Grandmother    Aneurysm Paternal Grandfather    Arthritis Mother    Hypertension Mother    Hyperlipidemia Mother    Breast cancer Cousin    Social History   Socioeconomic History   Marital status: Married    Spouse  name: Not on file   Number of children: Not on file   Years of education: Not on file   Highest education level: 12th grade  Occupational History   Not on file  Tobacco Use   Smoking status: Never   Smokeless tobacco: Never  Vaping Use   Vaping status: Never Used  Substance and Sexual Activity   Alcohol use: Yes    Alcohol/week: 0.0 standard drinks of alcohol    Comment: OCCASIONALLY   Drug use: No   Sexual activity: Not on file  Other Topics Concern   Not on file  Social History Narrative   Not on file   Social Drivers of Health   Financial Resource Strain: Low Risk  (08/28/2023)   Overall Financial Resource Strain (CARDIA)    Difficulty of Paying Living Expenses: Not hard at all  Food Insecurity: No Food Insecurity (08/28/2023)   Hunger Vital Sign    Worried About Running Out of Food in the Last Year: Never true    Ran Out of Food in the Last Year: Never true  Transportation Needs: No Transportation Needs (08/28/2023)   PRAPARE - Administrator, Civil Service (Medical): No    Lack of Transportation (Non-Medical): No  Physical Activity: Sufficiently Active (08/28/2023)   Exercise Vital Sign    Days of Exercise per Week: 5 days    Minutes of Exercise per Session: 60 min  Stress: No Stress Concern Present (08/28/2023)   [employer] of Occupational Health - Occupational Stress Questionnaire    Feeling of Stress : Not at all  Social Connections: Socially Integrated (08/28/2023)   Social Connection and Isolation Panel [NHANES]    Frequency of Communication with Friends and Family: Three times a week    Frequency of Social Gatherings with Friends and Family: More than three times a week    Attends Religious Services: More than 4 times per year    Active Member of Clubs or Organizations: Yes    Attends Engineer, structural: More than 4 times per year    Marital Status: Married    Tobacco Counseling Counseling given: Not Answered   Clinical  Intake:  Pre-visit preparation completed: Yes  Pain : No/denies pain     BMI - recorded: 32.4 Nutritional Status: BMI > 30  Obese Nutritional Risks: None Diabetes: No  Green often do you need to have someone help you when you read instructions, pamphlets, or other written materials from your doctor or pharmacy?: 1 - Never  Interpreter Needed?: No  Information entered by :: Kennedy Bucker, LPN   Activities of Daily Living    08/28/2023    2:33 PM 08/27/2023   10:17 AM  In your present state of health, do you have any difficulty performing the following activities:  Hearing? 1 1  Vision? 0 0  Difficulty concentrating or making decisions? 0 0  Walking or climbing stairs? 0 0  Dressing or bathing? 0 0  Doing errands,  shopping? 0 0  Preparing Food and eating ? N N  Using the Toilet? N N  In the past six months, have you accidently leaked urine? Y Y  Do you have problems with loss of bowel control? N N  Managing your Medications? N N  Managing your Finances? N N  Housekeeping or managing your Housekeeping? N N    Patient Care Team: Ronnald Ramp, MD as PCP - General (Family Medicine) Albin Felling, OD (Optometry)  Indicate any recent Medical Services you may have received from other than [facility] providers in the past year (date may be approximate).     Assessment:   This is a routine wellness examination for Allisen.  Hearing/Vision screen Hearing Screening - Comments:: WEARS AIDS, BOTH EARS  Vision Screening - Comments:: READERS, HAD CATARACT SGY- DR[facility]SNIP[facility] IN [facility]   Goals Addressed             This Visit's Progress    DIET - INCREASE WATER INTAKE         Depression Screen    08/28/2023    2:30 PM 08/13/2023    9:44 AM 05/21/2023    8:37 AM 04/17/2023    8:39 AM 12/06/2022    8:53 AM 08/23/2022    2:10 PM 11/30/2021    9:13 AM  PHQ 2/9 Scores  PHQ - 2 Score 0 0 0 0 0 0 0  PHQ- 9 Score 0  0 0 1  0    Fall Risk    08/28/2023    2:33 PM  08/27/2023   10:17 AM 08/13/2023    9:44 AM 05/21/2023    8:37 AM 04/17/2023    8:39 AM  Fall Risk   Falls in the past year? 0 0 0 0 0  Number falls in past yr: 0 0 0 0 0  Injury with Fall? 0 0 0 0 0  Risk for fall due to : No Fall Risks  No Fall Risks    Follow up Falls prevention discussed;Falls evaluation completed  Falls evaluation completed      MEDICARE RISK AT HOME: Medicare Risk at Home Any stairs in or around the home?: Yes If so, are there any without handrails?: No Home free of loose throw rugs in walkways, pet beds, electrical cords, etc?: No Adequate lighting in your home to reduce risk of falls?: Yes Life alert?: No Use of a cane, walker or w/c?: No Grab bars in the bathroom?: No Shower chair or bench in shower?: No Elevated toilet seat or a handicapped toilet?: Yes  TIMED UP AND GO:  Was the test performed?  No    Cognitive Function:        08/28/2023    2:35 PM 08/23/2022    2:13 PM  6CIT Screen  What Year? 0 points 0 points  What month? 0 points 0 points  What time? 0 points 0 points  Count back from 20 0 points 0 points  Months in reverse 0 points 0 points  Repeat phrase 0 points 0 points  Total Score 0 points 0 points    Immunizations Immunization History  Administered Date(s) Administered   Fluad Quad(high Dose 65+) 04/29/2020, 05/05/2021, 06/07/2022   Fluad Trivalent(High Dose 65+) 04/17/2023   Influenza Inj Mdck Quad With Preservative 04/19/2018   Influenza,inj,Quad PF,6+ Mos 04/24/2017, 03/10/2019   Influenza-Unspecified 05/10/2017, 04/09/2018   PFIZER(Purple Top)SARS-COV-2 Vaccination 10/07/2019, 10/28/2019   Pneumococcal Conjugate-13 04/28/2019   Pneumococcal Polysaccharide-23 04/29/2020   Tdap 03/26/2015  Zoster Recombinant(Shingrix) 04/28/2019, 08/25/2019   Zoster, Live 11/27/2014    TDAP status: Up to date  Flu Vaccine status: Up to date  Pneumococcal vaccine status: Up to date  Covid-19 vaccine status: Completed  vaccines  Qualifies for Shingles Vaccine? Yes   Zostavax completed Yes   Shingrix Completed?: Yes  Screening Tests Health Maintenance  Topic Date Due   COVID-19 Vaccine (3 - 2024-25 season) 03/11/2023   MAMMOGRAM  09/01/2023   Medicare Annual Wellness (AWV)  08/27/2024   DTaP/Tdap/Td (2 - Td or Tdap) 03/25/2025   Colonoscopy  02/10/2026   Pneumonia Vaccine 95+ Years old  Completed   INFLUENZA VACCINE  Completed   DEXA SCAN  Completed   Hepatitis C Screening  Completed   Zoster Vaccines- Shingrix  Completed   HPV VACCINES  Aged Out    Health Maintenance  Health Maintenance Due  Topic Date Due   COVID-19 Vaccine (3 - 2024-25 season) 03/11/2023    Colorectal cancer screening: Type of screening: Colonoscopy. Completed 02/10/21. Repeat every 5 years  Mammogram status: Completed SCHEDULED 09/11/23. Repeat every year  Bone Density status: Completed 07/14/20. Results reflect: Bone density results: OSTEOPENIA. Repeat every 5 years.  Lung Cancer Screening: (Low Dose CT Chest recommended if Age 21-80 years, 20 pack-year currently smoking OR have quit w/in 15years.) does not qualify.    Additional Screening:  Hepatitis C Screening: does qualify; Completed 04/26/12  Vision Screening: Recommended annual ophthalmology exams for early detection of glaucoma and other disorders of the eye. Is the patient up to date with their annual eye exam?  Yes  Who is the provider or what is the name of the office in which the patient attends annual eye exams? DR.SNIPES If pt is not established with a provider, would they like to be referred to a provider to establish care? No .   Dental Screening: Recommended annual dental exams for proper oral hygiene   Community Resource Referral / Chronic Care Management: CRR required this visit?  No   CCM required this visit?  No     Plan:     I have personally reviewed and noted the following in the patient's chart:   Medical and social history Use of  alcohol, tobacco or illicit drugs  Current medications and supplements including opioid prescriptions. Patient is not currently taking opioid prescriptions. Functional ability and status Nutritional status Physical activity Advanced directives List of other physicians Hospitalizations, surgeries, and ER visits in previous 12 months Vitals Screenings to include cognitive, depression, and falls Referrals and appointments  In addition, I have reviewed and discussed with patient certain preventive protocols, quality metrics, and best practice recommendations. A written personalized care plan for preventive services as well as general preventive health recommendations were provided to patient.     Hal Hope, LPN   01/06/5283   After Visit Summary: (MyChart) Due to this being a telephonic visit, the after visit summary with patients personalized plan was offered to patient via MyChart   Nurse Notes: NONE

## 2023-09-04 DIAGNOSIS — H04542 Stenosis of left lacrimal canaliculi: Secondary | ICD-10-CM | POA: Diagnosis not present

## 2023-09-04 DIAGNOSIS — H2511 Age-related nuclear cataract, right eye: Secondary | ICD-10-CM | POA: Diagnosis not present

## 2023-09-04 DIAGNOSIS — H524 Presbyopia: Secondary | ICD-10-CM | POA: Diagnosis not present

## 2023-09-04 DIAGNOSIS — H26493 Other secondary cataract, bilateral: Secondary | ICD-10-CM | POA: Diagnosis not present

## 2023-09-04 DIAGNOSIS — H47293 Other optic atrophy, bilateral: Secondary | ICD-10-CM | POA: Diagnosis not present

## 2023-09-11 ENCOUNTER — Ambulatory Visit
Admission: RE | Admit: 2023-09-11 | Discharge: 2023-09-11 | Disposition: A | Discharge status: Home / Self Care | Payer: Medicare HMO | Source: Ambulatory Visit | Attending: Family Medicine | Admitting: Family Medicine

## 2023-09-11 DIAGNOSIS — Z1231 Encounter for screening mammogram for malignant neoplasm of breast: Secondary | ICD-10-CM | POA: Insufficient documentation

## 2023-09-14 ENCOUNTER — Encounter: Payer: Self-pay | Admitting: Family Medicine

## 2023-10-16 ENCOUNTER — Ambulatory Visit: Admitting: Podiatry

## 2023-10-16 DIAGNOSIS — M7752 Other enthesopathy of left foot: Secondary | ICD-10-CM

## 2023-10-16 DIAGNOSIS — L03031 Cellulitis of right toe: Secondary | ICD-10-CM

## 2023-10-16 NOTE — Progress Notes (Signed)
 Subjective:  Patient ID: Bridget Green, female    DOB: 03/02/1956,  MRN: [id number]  Chief Complaint  Patient presents with   Foot Pain    Pt stated that she is starting to have some pain in her foot again she stated that the injections helped really good last time     68 y.o. female presents with the above complaint.  Patient presents with left second metatarsophalangeal joint pain.  She states the pain came back she would like to do another injection she has secondary complaint of right hallux mild erythema with some pain especially with certain type of shoes.  She would like to discuss treatment options for denies any other acute complaints   Review of Systems: Negative except as noted in the HPI. Denies N/V/F/Ch.  Past Medical History:  Diagnosis Date   Arthritis    Family history of adverse reaction to anesthesia    DAD-HARD TO WAKE UP   GERD (gastroesophageal reflux disease)    History of hiatal hernia    Hyperlipidemia    Hypertension     Current Outpatient Medications:    co-enzyme Q-10 30 MG capsule, Take 30 mg by mouth 3 (three) times daily., Disp: , Rfl:    ezetimibe (ZETIA) 10 MG tablet, TAKE 1 TABLET EVERY DAY, Disp: 90 tablet, Rfl: 3   fluticasone (FLONASE) 50 MCG/ACT nasal spray, USE 2 SPRAYS IN EACH NOSTRIL AT BEDTIME, Disp: 48 g, Rfl: 10   lisinopril-hydrochlorothiazide (ZESTORETIC) 20-12.5 MG tablet, Take 1 tablet by mouth daily., Disp: 90 tablet, Rfl: 3   Multiple Vitamins-Minerals (CENTRUM SILVER ADULT 50+ PO), Take 1 tablet by mouth daily., Disp: , Rfl:    omeprazole (PRILOSEC) 20 MG capsule, TAKE 1 CAPSULE EVERY DAY, Disp: 90 capsule, Rfl: 3   VITAMIN D PO, Take by mouth., Disp: , Rfl:   Social History   Tobacco Use  Smoking Status Never  Smokeless Tobacco Never    Allergies  Allergen Reactions   Penicillins Itching and Other (See Comments)    DID THE REACTION INVOLVE: Swelling of the face/tongue/throat, SOB, or low BP? No Sudden or severe  rash/hives, skin peeling, or the inside of the mouth or nose? No Did it require medical treatment? No When did it last happen? Unknown age If all above answers are "NO", may proceed with cephalosporin use.    Sulfa Antibiotics Itching   Objective:  There were no vitals filed for this visit. There is no height or weight on file to calculate BMI. Constitutional Well developed. Well nourished.  Vascular Dorsalis pedis pulses palpable bilaterally. Posterior tibial pulses palpable bilaterally. Capillary refill normal to all digits.  No cyanosis or clubbing noted. Pedal hair growth normal.  Neurologic Normal speech. Oriented to person, place, and time. Epicritic sensation to light touch grossly present bilaterally.  Dermatologic Nails well groomed and normal in appearance. No open wounds. No skin lesions.  Orthopedic: Pain on palpation left second metatarsophalangeal joint pain with range of motion of the second metatarsophalangeal joint.  No deep intra-articular pain noted  Mild paronychia noted to right hallux medial border.  No deep infection noted no abscess noted.   Radiographs: 3 views of sketcher mature adult left foot:: Uneven joint space narrowing noted at the left great toe no other bony abnormalities noted midfoot arthritis noted. Assessment:   No diagnosis found.  Plan:  Patient was evaluated and treated and all questions answered.  Right hallux paronychia - I explained the patient the etiology of paronychia emergency room  and options were discussed.  Given the presence of erythema patient will benefit from doxycycline 10-day course.  If it continues to recur will benefit from ingrown nail removal  Left second metatarsophalangeal joint capsulitis -All questions and concerns were discussed with the patient in extensive detail given the amount of pain that she is having she will benefit from steroid injection at decreasing inflammatory component associate with pain.   Patient agrees with plan to proceed with steroid injection -A steroid injection was performed at left second MTP using 1% plain Lidocaine and 10 mg of Kenalog. This was well tolerated.   No follow-ups on file.

## 2023-10-17 DIAGNOSIS — Z961 Presence of intraocular lens: Secondary | ICD-10-CM | POA: Diagnosis not present

## 2023-10-17 DIAGNOSIS — H26491 Other secondary cataract, right eye: Secondary | ICD-10-CM | POA: Diagnosis not present

## 2023-10-17 DIAGNOSIS — H18413 Arcus senilis, bilateral: Secondary | ICD-10-CM | POA: Diagnosis not present

## 2023-10-17 DIAGNOSIS — I10 Essential (primary) hypertension: Secondary | ICD-10-CM | POA: Diagnosis not present

## 2023-11-19 DIAGNOSIS — E785 Hyperlipidemia, unspecified: Secondary | ICD-10-CM | POA: Diagnosis not present

## 2023-11-19 DIAGNOSIS — M1990 Unspecified osteoarthritis, unspecified site: Secondary | ICD-10-CM | POA: Diagnosis not present

## 2023-11-19 DIAGNOSIS — K219 Gastro-esophageal reflux disease without esophagitis: Secondary | ICD-10-CM | POA: Diagnosis not present

## 2023-11-19 DIAGNOSIS — Z8249 Family history of ischemic heart disease and other diseases of the circulatory system: Secondary | ICD-10-CM | POA: Diagnosis not present

## 2023-11-19 DIAGNOSIS — L309 Dermatitis, unspecified: Secondary | ICD-10-CM | POA: Diagnosis not present

## 2023-11-19 DIAGNOSIS — I10 Essential (primary) hypertension: Secondary | ICD-10-CM | POA: Diagnosis not present

## 2023-11-19 DIAGNOSIS — E669 Obesity, unspecified: Secondary | ICD-10-CM | POA: Diagnosis not present

## 2023-11-19 DIAGNOSIS — Z809 Family history of malignant neoplasm, unspecified: Secondary | ICD-10-CM | POA: Diagnosis not present

## 2023-11-19 DIAGNOSIS — J301 Allergic rhinitis due to pollen: Secondary | ICD-10-CM | POA: Diagnosis not present

## 2023-11-19 DIAGNOSIS — H9193 Unspecified hearing loss, bilateral: Secondary | ICD-10-CM | POA: Diagnosis not present

## 2023-11-19 DIAGNOSIS — K5909 Other constipation: Secondary | ICD-10-CM | POA: Diagnosis not present

## 2023-11-23 ENCOUNTER — Telehealth: Payer: Self-pay

## 2023-11-23 NOTE — Telephone Encounter (Signed)
 Copied from CRM [id number]. Topic: Appointments - Appointment Scheduling >> Nov 23, 2023 11:07 AM Sophia H wrote: Patient/patient representative is calling to schedule an appointment. Refer to attachments for appointment information.    Unable to schedule physical as soonest available not till 08/2024.  Please reach out to patient if anything sooner is open for a physical, she does not want to wait till 2026.

## 2023-11-23 NOTE — Telephone Encounter (Signed)
 Spoke to patient and got her scheduled for phyiscal 01/10/24 @ 9:20am

## 2023-11-27 DIAGNOSIS — D2262 Melanocytic nevi of left upper limb, including shoulder: Secondary | ICD-10-CM | POA: Diagnosis not present

## 2023-11-27 DIAGNOSIS — L570 Actinic keratosis: Secondary | ICD-10-CM | POA: Diagnosis not present

## 2023-11-27 DIAGNOSIS — D2261 Melanocytic nevi of right upper limb, including shoulder: Secondary | ICD-10-CM | POA: Diagnosis not present

## 2023-11-27 DIAGNOSIS — D2271 Melanocytic nevi of right lower limb, including hip: Secondary | ICD-10-CM | POA: Diagnosis not present

## 2023-11-27 DIAGNOSIS — L821 Other seborrheic keratosis: Secondary | ICD-10-CM | POA: Diagnosis not present

## 2023-11-27 DIAGNOSIS — D225 Melanocytic nevi of trunk: Secondary | ICD-10-CM | POA: Diagnosis not present

## 2023-11-27 DIAGNOSIS — D2272 Melanocytic nevi of left lower limb, including hip: Secondary | ICD-10-CM | POA: Diagnosis not present

## 2023-11-27 DIAGNOSIS — Z85828 Personal history of other malignant neoplasm of skin: Secondary | ICD-10-CM | POA: Diagnosis not present

## 2023-12-07 ENCOUNTER — Encounter: Payer: Self-pay | Admitting: Family Medicine

## 2023-12-27 ENCOUNTER — Other Ambulatory Visit: Payer: Self-pay | Admitting: Physician Assistant

## 2023-12-27 DIAGNOSIS — K219 Gastro-esophageal reflux disease without esophagitis: Secondary | ICD-10-CM

## 2023-12-31 DIAGNOSIS — H26492 Other secondary cataract, left eye: Secondary | ICD-10-CM | POA: Diagnosis not present

## 2024-01-10 ENCOUNTER — Ambulatory Visit: Admitting: Family Medicine

## 2024-01-10 ENCOUNTER — Encounter: Payer: Self-pay | Admitting: Family Medicine

## 2024-01-10 VITALS — BP 137/73 | HR 66 | Ht 64.0 in | Wt 187.0 lb

## 2024-01-10 DIAGNOSIS — M6740 Ganglion, unspecified site: Secondary | ICD-10-CM

## 2024-01-10 DIAGNOSIS — Z0000 Encounter for general adult medical examination without abnormal findings: Secondary | ICD-10-CM | POA: Insufficient documentation

## 2024-01-10 DIAGNOSIS — I10 Essential (primary) hypertension: Secondary | ICD-10-CM

## 2024-01-10 DIAGNOSIS — Z0001 Encounter for general adult medical examination with abnormal findings: Secondary | ICD-10-CM

## 2024-01-10 DIAGNOSIS — J4520 Mild intermittent asthma, uncomplicated: Secondary | ICD-10-CM

## 2024-01-10 DIAGNOSIS — E66811 Obesity, class 1: Secondary | ICD-10-CM | POA: Diagnosis not present

## 2024-01-10 DIAGNOSIS — E559 Vitamin D deficiency, unspecified: Secondary | ICD-10-CM

## 2024-01-10 DIAGNOSIS — Z130 Encounter for screening for diseases of the blood and blood-forming organs and certain disorders involving the immune mechanism: Secondary | ICD-10-CM

## 2024-01-10 DIAGNOSIS — N951 Menopausal and female climacteric states: Secondary | ICD-10-CM

## 2024-01-10 DIAGNOSIS — K219 Gastro-esophageal reflux disease without esophagitis: Secondary | ICD-10-CM

## 2024-01-10 DIAGNOSIS — E6609 Other obesity due to excess calories: Secondary | ICD-10-CM | POA: Diagnosis not present

## 2024-01-10 DIAGNOSIS — E782 Mixed hyperlipidemia: Secondary | ICD-10-CM | POA: Diagnosis not present

## 2024-01-10 DIAGNOSIS — Z6832 Body mass index (BMI) 32.0-32.9, adult: Secondary | ICD-10-CM

## 2024-01-10 DIAGNOSIS — G2581 Restless legs syndrome: Secondary | ICD-10-CM

## 2024-01-10 NOTE — Patient Instructions (Signed)
 It was a pleasure to see you today!  Thank you for choosing [facility] for your primary care.   Today you were seen for your annual physical  Please review the attached information regarding helpful preventive health topics.   To keep you healthy, please keep in mind the following health maintenance items that you are due for:   Health Maintenance Due  Topic Date Due   COVID-19 Vaccine (3 - 2024-25 season) 03/11/2023     Best Wishes,   Dr. Lang

## 2024-01-10 NOTE — Progress Notes (Signed)
 Complete physical exam   [name]   DOB: 1956-03-10   68 y.o. Female  MRN: [id number] Visit Date: 01/10/2024  Today's healthcare provider: Rockie Agent, MD   Chief Complaint  Patient presents with   Annual Exam   Care Management    Pattern of eating:General   Sleep:Normal  Are you exercising: No         Subjective    Bridget Green is a 68 y.o. female who presents today for a complete physical exam.     AWV 08/2023  She does not have additional problems to discuss today.   Discussed the use of AI scribe software for clinical note transcription with the patient, who gave verbal consent to proceed.  History of Present Illness Bridget Green is a 68 year old female who presents for an annual physical exam.  She has a history of hypertension and notes a recent blood pressure reading of 137/70 at her mother's house, but expresses concern about higher readings at the clinic, with a recent measurement of 147/54.  She experiences hip pain and knee stiffness, describing her knees as wanting to lock up. She attributes these symptoms to aging. Additionally, she has a ganglion cyst on her right dorsal wrist that appeared about two weeks ago. She has a history of cysts, which typically resolve spontaneously.  She is dealing with issues regarding her insurance provider, [employer], not covering her last two visits, which included a wellness visit and a hypertension visit. She has been in contact with both [employer] and the billing department but has not received a resolution, suspecting the issue may be related to a change in the tax ID number.     Past Medical History:  Diagnosis Date   Arthritis    Family history of adverse reaction to anesthesia    DAD-HARD TO WAKE UP   GERD (gastroesophageal reflux disease)    History of hiatal hernia    Hyperlipidemia    Hypertension    Past Surgical History:  Procedure Laterality Date   ADENOIDECTOMY  1960    APPENDECTOMY  1983   BREAST BIOPSY Right 06/20/2018   stereo for distortion  SMALL COMPLEX SCLEROSING LESION   BREAST EXCISIONAL BIOPSY Right 07/22/2018   NP for  SMALL COMPLEX SCLEROSING LESION   BREAST LUMPECTOMY WITH NEEDLE LOCALIZATION Right 07/22/2018   Procedure: BREAST LUMPECTOMY WITH NEEDLE LOCALIZATION;  Surgeon: Desiderio Schanz, MD;  Location: [facility];  Service: General;  Laterality: Right;   CESAREAN SECTION  [id number]   CHOLECYSTECTOMY  12/2011   COLONOSCOPY WITH PROPOFOL  N/A 02/10/2021   Procedure: COLONOSCOPY WITH PROPOFOL ;  Surgeon: Jinny Carmine, MD;  Location: [facility];  Service: Endoscopy;  Laterality: N/A;   LASIK Bilateral 2008   POLYPECTOMY  02/10/2021   Procedure: POLYPECTOMY;  Surgeon: Jinny Carmine, MD;  Location: [facility];  Service: Endoscopy;;   TONSILLECTOMY  1960   Social History   Socioeconomic History   Marital status: Married    Spouse name: Not on file   Number of children: Not on file   Years of education: Not on file   Highest education level: 12th grade  Occupational History   Not on file  Tobacco Use   Smoking status: Never   Smokeless tobacco: Never  Vaping Use   Vaping status: Never Used  Substance and Sexual Activity   Alcohol use: Yes    Alcohol/week: 0.0 standard drinks of alcohol    Comment: OCCASIONALLY  Drug use: No   Sexual activity: Not on file  Other Topics Concern   Not on file  Social History Narrative   Not on file   Social Drivers of Health   Financial Resource Strain: Low Risk  (08/28/2023)   Overall Financial Resource Strain (CARDIA)    Difficulty of Paying Living Expenses: Not hard at all  Food Insecurity: No Food Insecurity (08/28/2023)   Hunger Vital Sign    Worried About Running Out of Food in the Last Year: Never true    Ran Out of Food in the Last Year: Never true  Transportation Needs: No Transportation Needs (08/28/2023)   PRAPARE - Administrator, Civil Service (Medical): No     Lack of Transportation (Non-Medical): No  Physical Activity: Sufficiently Active (08/28/2023)   Exercise Vital Sign    Days of Exercise per Week: 5 days    Minutes of Exercise per Session: 60 min  Stress: No Stress Concern Present (08/28/2023)   [employer] of Occupational Health - Occupational Stress Questionnaire    Feeling of Stress : Not at all  Social Connections: Socially Integrated (08/28/2023)   Social Connection and Isolation Panel    Frequency of Communication with Friends and Family: Three times a week    Frequency of Social Gatherings with Friends and Family: More than three times a week    Attends Religious Services: More than 4 times per year    Active Member of [employer] or Organizations: Yes    Attends Engineer, structural: More than 4 times per year    Marital Status: Married  Catering manager Violence: Not At Risk (08/28/2023)   Humiliation, Afraid, Rape, and Kick questionnaire    Fear of Current or Ex-Partner: No    Emotionally Abused: No    Physically Abused: No    Sexually Abused: No   Family Status  Relation Name Status   Father  Deceased at age 20   Sister  Alive   MGM  Deceased at age 87   MGF  Deceased   PGM  Deceased   PGF  Deceased   Mother  (Not Specified)   Cousin  (Not Specified)  No partnership data on file   Family History  Problem Relation Age of Onset   Colon cancer Father    Arthritis Sister    Hypertension Sister    Heart attack Maternal Grandfather    Heart attack Paternal Grandmother    Thyroid  cancer Paternal Grandmother    Aneurysm Paternal Grandfather    Arthritis Mother    Hypertension Mother    Hyperlipidemia Mother    Breast cancer Cousin    Allergies  Allergen Reactions   Penicillins Itching and Other (See Comments)    DID THE REACTION INVOLVE: Swelling of the face/tongue/throat, SOB, or low BP? No Sudden or severe rash/hives, skin peeling, or the inside of the mouth or nose? No Did it require medical treatment?  No When did it last happen? Unknown age If all above answers are "NO", may proceed with cephalosporin use.    Sulfa Antibiotics Itching     Medications: Outpatient Medications Prior to Visit  Medication Sig   co-enzyme Q-10 30 MG capsule Take 30 mg by mouth 3 (three) times daily.   ezetimibe  (ZETIA ) 10 MG tablet TAKE 1 TABLET EVERY DAY   fluticasone  (FLONASE ) 50 MCG/ACT nasal spray USE 2 SPRAYS IN EACH NOSTRIL AT BEDTIME   lisinopril -hydrochlorothiazide  (ZESTORETIC ) 20-12.5 MG tablet Take 1 tablet by mouth  daily.   Multiple Vitamins-Minerals (CENTRUM SILVER ADULT 50+ PO) Take 1 tablet by mouth daily.   omeprazole  (PRILOSEC) 20 MG capsule TAKE 1 CAPSULE EVERY DAY   VITAMIN D  PO Take by mouth.   No facility-administered medications prior to visit.    Review of Systems  Last CBC Lab Results  Component Value Date   WBC 5.1 12/06/2022   HGB 14.6 12/06/2022   HCT 42.7 12/06/2022   MCV 91 12/06/2022   MCH 31.1 12/06/2022   RDW 11.9 12/06/2022   PLT 218 12/06/2022   Last metabolic panel Lab Results  Component Value Date   GLUCOSE 93 05/21/2023   NA 143 05/21/2023   K 4.7 05/21/2023   CL 108 (H) 05/21/2023   CO2 22 05/21/2023   BUN 9 05/21/2023   CREATININE 0.87 05/21/2023   EGFR 72 05/21/2023   CALCIUM  9.5 05/21/2023   PROT 7.0 04/17/2023   ALBUMIN 4.5 04/17/2023   LABGLOB 2.5 04/17/2023   AGRATIO 1.9 12/06/2022   BILITOT 0.6 04/17/2023   ALKPHOS 98 04/17/2023   AST 19 04/17/2023   ALT 23 04/17/2023   ANIONGAP 5 (L) 12/07/2011   Last lipids Lab Results  Component Value Date   CHOL 213 (H) 12/06/2022   HDL 76 12/06/2022   LDLCALC 123 (H) 12/06/2022   TRIG 79 12/06/2022   CHOLHDL 2.8 12/06/2022   Last hemoglobin A1c Lab Results  Component Value Date   HGBA1C 5.9 (H) 12/06/2022   Last thyroid  functions Lab Results  Component Value Date   TSH 0.874 12/06/2022   Last vitamin D  Lab Results  Component Value Date   VD25OH 35.6 11/29/2020   Last vitamin  B12 and Folate No results found for: VITAMINB12, FOLATE     Objective    BP (!) 147/54   Pulse 66   Ht 5' 4 (1.626 m)   Wt 187 lb (84.8 kg)   SpO2 100%   BMI 32.10 kg/m  BP Readings from Last 3 Encounters:  01/10/24 (!) 147/54  08/13/23 134/62  05/30/23 (!) 142/68   Wt Readings from Last 3 Encounters:  01/10/24 187 lb (84.8 kg)  08/13/23 189 lb 3.2 oz (85.8 kg)  05/30/23 184 lb (83.5 kg)        Physical Exam Vitals reviewed.  Constitutional:      General: She is not in acute distress.    Appearance: Normal appearance. She is not ill-appearing, toxic-appearing or diaphoretic.  HENT:     Head: Normocephalic and atraumatic.     Right Ear: Tympanic membrane and external ear normal. There is no impacted cerumen.     Left Ear: Tympanic membrane and external ear normal. There is no impacted cerumen.     Nose: Nose normal.     Mouth/Throat:     Pharynx: Oropharynx is clear.  Eyes:     General: No scleral icterus.    Extraocular Movements: Extraocular movements intact.     Conjunctiva/sclera: Conjunctivae normal.     Pupils: Pupils are equal, round, and reactive to light.  Cardiovascular:     Rate and Rhythm: Normal rate and regular rhythm.     Pulses: Normal pulses.     Heart sounds: Normal heart sounds. No murmur heard.    No friction rub. No gallop.  Pulmonary:     Effort: Pulmonary effort is normal. No respiratory distress.     Breath sounds: Normal breath sounds. No wheezing, rhonchi or rales.  Abdominal:     General: Bowel sounds are  normal. There is no distension.     Palpations: Abdomen is soft. There is no mass.     Tenderness: There is no abdominal tenderness. There is no guarding.  Musculoskeletal:        General: No deformity.     Cervical back: Normal range of motion and neck supple.     Right lower leg: No edema.     Left lower leg: No edema.     Comments: Right palmar ganglion cyst on   Lymphadenopathy:     Cervical: No cervical adenopathy.   Skin:    General: Skin is warm.     Capillary Refill: Capillary refill takes less than 2 seconds.     Findings: No erythema or rash.  Neurological:     General: No focal deficit present.     Mental Status: She is alert and oriented to person, place, and time.     Cranial Nerves: Cranial nerves 2-12 are intact. No cranial nerve deficit or facial asymmetry.     Motor: Motor function is intact. No weakness.     Gait: Gait normal.  Psychiatric:        Mood and Affect: Mood normal.        Behavior: Behavior normal.      Last depression screening scores    01/10/2024    9:33 AM 08/28/2023    2:30 PM 08/13/2023    9:44 AM  PHQ 2/9 Scores  PHQ - 2 Score 0 0 0  PHQ- 9 Score 0 0     Last fall risk screening    08/28/2023    2:33 PM  Fall Risk   Falls in the past year? 0  Number falls in past yr: 0  Injury with Fall? 0  Risk for fall due to : No Fall Risks  Follow up Falls prevention discussed;Falls evaluation completed    Last Audit-C alcohol use screening    08/28/2023    2:29 PM  Alcohol Use Disorder Test (AUDIT)  1. How often do you have a drink containing alcohol? 1  2. How many drinks containing alcohol do you have on a typical day when you are drinking? 0  3. How often do you have six or more drinks on one occasion? 0  AUDIT-C Score 1   A score of 3 or more in women, and 4 or more in men indicates increased risk for alcohol abuse, EXCEPT if all of the points are from question 1   No results found for any visits on 01/10/24.  Assessment & Plan    Routine Health Maintenance and Physical Exam  Immunization History  Administered Date(s) Administered   Fluad Quad(high Dose 65+) 04/29/2020, 05/05/2021, 06/07/2022   Fluad Trivalent(High Dose 65+) 04/17/2023   Influenza Inj Mdck Quad With Preservative 04/19/2018   Influenza,inj,Quad PF,6+ Mos 04/24/2017, 03/10/2019   Influenza-Unspecified 05/10/2017, 04/09/2018   PFIZER(Purple Top)SARS-COV-2 Vaccination 10/07/2019,  10/28/2019   Pneumococcal Conjugate-13 04/28/2019   Pneumococcal Polysaccharide-23 04/29/2020   Tdap 03/26/2015   Zoster Recombinant(Shingrix ) 04/28/2019, 08/25/2019   Zoster, Live 11/27/2014    Health Maintenance  Topic Date Due   COVID-19 Vaccine (3 - 2024-25 season) 03/11/2023   INFLUENZA VACCINE  02/08/2024   Medicare Annual Wellness (AWV)  08/27/2024   MAMMOGRAM  09/10/2024   DTaP/Tdap/Td (2 - Td or Tdap) 03/25/2025   Colonoscopy  02/10/2026   Pneumococcal Vaccine: 50+ Years  Completed   DEXA SCAN  Completed   Hepatitis C Screening  Completed   Zoster  Vaccines- Shingrix   Completed   Hepatitis B Vaccines  Aged Out   HPV VACCINES  Aged Out   Meningococcal B Vaccine  Aged Out    Problem List Items Addressed This Visit       Cardiovascular and Mediastinum   Hot flashes due to menopause   Essential (primary) hypertension     Respiratory   Asthma, exogenous     Digestive   Acid reflux   Relevant Orders   CBC     Other   Restless leg   Mixed hyperlipidemia   Relevant Orders   Lipid panel   Class 1 obesity due to excess calories with serious comorbidity and body mass index (BMI) of 32.0 to 32.9 in adult   Relevant Orders   Hemoglobin A1c   CMP14+EGFR   Avitaminosis D   Relevant Orders   VITAMIN D  25 Hydroxy (Vit-D Deficiency, Fractures)   Annual physical exam - Primary   Other Visit Diagnoses       Screening, anemia, deficiency, iron       Relevant Orders   CBC     Ganglion cyst            Assessment & Plan Annual Physical Examination Routine annual physical examination to assess overall health status. Discussed the need for laboratory tests to monitor diabetes, cholesterol, and anemia. She expressed understanding of the need for pre-visit lab orders and agreed to send a MyChart message for future visits. - Order A1c, CBC, lipid panel, comprehensive metabolic panel, and vitamin D  level  Hypertension Chronic  Hypertension with recent blood pressure  reading of 147/54 mmHg, which is elevated. Home readings reportedly in the 130s. Discussed the accuracy of the office blood pressure machine and the need for manual recheck. She prefers follow-up in 3-4 months unless blood pressure remains elevated. - Recheck blood pressure manually - Schedule follow-up in 3-4 months for chronic condition management, sooner if blood pressure remains elevated  Ganglion Cyst Acute  Ganglion cyst on the right dorsal wrist, soft and non-painful. She has a history of cysts that come and go. Discussed that cysts often resolve spontaneously and surgical removal may lead to recurrence.  General Health Maintenance Discussed the importance of regular health maintenance including laboratory tests and monitoring of chronic conditions. Addressed potential issues with insurance billing and coding, advised to speak with office manager for resolution. - Encourage sending MyChart message for pre-visit lab orders - Advised to speak with office manager for insurance billing and coding issues       Return in about 3 months (around 04/11/2024) for CHRONIC F/U.       Rockie Agent, MD  [facility] [phone] (phone) [phone] (fax)  [facility]

## 2024-01-11 LAB — CBC
Hematocrit: 44.1 % (ref 34.0–46.6)
Hemoglobin: 14.5 g/dL (ref 11.1–15.9)
MCH: 30.7 pg (ref 26.6–33.0)
MCHC: 32.9 g/dL (ref 31.5–35.7)
MCV: 93 fL (ref 79–97)
Platelets: 228 x10E3/uL (ref 150–450)
RBC: 4.73 x10E6/uL (ref 3.77–5.28)
RDW: 12.5 % (ref 11.7–15.4)
WBC: 6 x10E3/uL (ref 3.4–10.8)

## 2024-01-11 LAB — CMP14+EGFR
ALT: 20 IU/L (ref 0–32)
AST: 17 IU/L (ref 0–40)
Albumin: 4.3 g/dL (ref 3.9–4.9)
Alkaline Phosphatase: 93 IU/L (ref 44–121)
BUN/Creatinine Ratio: 18 (ref 12–28)
BUN: 15 mg/dL (ref 8–27)
Bilirubin Total: 0.4 mg/dL (ref 0.0–1.2)
CO2: 19 mmol/L — ABNORMAL LOW (ref 20–29)
Calcium: 9.6 mg/dL (ref 8.7–10.3)
Chloride: 107 mmol/L — ABNORMAL HIGH (ref 96–106)
Creatinine, Ser: 0.85 mg/dL (ref 0.57–1.00)
Globulin, Total: 2.3 g/dL (ref 1.5–4.5)
Glucose: 96 mg/dL (ref 70–99)
Potassium: 4.6 mmol/L (ref 3.5–5.2)
Sodium: 143 mmol/L (ref 134–144)
Total Protein: 6.6 g/dL (ref 6.0–8.5)
eGFR: 74 mL/min/1.73 (ref >59)

## 2024-01-11 LAB — LIPID PANEL
Chol/HDL Ratio: 3.1 ratio (ref 0.0–4.4)
Cholesterol, Total: 201 mg/dL — ABNORMAL HIGH (ref 100–199)
HDL: 64 mg/dL (ref >39)
LDL Chol Calc (NIH): 120 mg/dL — ABNORMAL HIGH (ref 0–99)
Triglycerides: 98 mg/dL (ref 0–149)
VLDL Cholesterol Cal: 17 mg/dL (ref 5–40)

## 2024-01-11 LAB — HEMOGLOBIN A1C
Est. average glucose Bld gHb Est-mCnc: 123 mg/dL
Hgb A1c MFr Bld: 5.9 % — ABNORMAL HIGH (ref 4.8–5.6)

## 2024-01-11 LAB — VITAMIN D 25 HYDROXY (VIT D DEFICIENCY, FRACTURES): Vit D, 25-Hydroxy: 43.1 ng/mL (ref 30.0–100.0)

## 2024-01-14 ENCOUNTER — Ambulatory Visit: Payer: Self-pay | Admitting: Family Medicine

## 2024-01-18 DIAGNOSIS — H52203 Unspecified astigmatism, bilateral: Secondary | ICD-10-CM | POA: Diagnosis not present

## 2024-02-14 ENCOUNTER — Ambulatory Visit: Payer: Self-pay

## 2024-02-14 NOTE — Telephone Encounter (Deleted)
 Dr. Lang has a Same Day slot available Monday at 4pm. It would be much more appropriate for her to be scheduled with Dr. Lang.

## 2024-02-14 NOTE — Telephone Encounter (Signed)
 FYI Only or Action Required?: FYI only for provider.  Patient was last seen in primary care on 01/10/2024 by Sharma Coyer, MD.  Called Nurse Triage reporting Joint Swelling and Knee Pain.  Symptoms began several days ago.  Interventions attempted: OTC medications: Tylenol  arthritis and Ice/heat application.  Symptoms are: right knee pain and swelling, intermittent right hip pain gradually worsening.  Triage Disposition: See PCP When Office is Open (Within 3 Days)  Patient/caregiver understands and will follow disposition?: Yes              Copied from CRM [id number]. Topic: Clinical - Red Word Triage >> Feb 14, 2024  8:02 AM Rosaria BRAVO wrote: Red Word that prompted transfer to Nurse Triage: Severe swelling in right knee, very painful since Tuesday.    ----------------------------------------------------------------------- From previous Reason for Contact - Scheduling: Patient/patient representative is calling to schedule an appointment. Refer to attachments for appointment information. Reason for Disposition  MILD or MODERATE swelling (e.g., can't move joint normally, can't do usual activities) (Exceptions: Itchy, localized swelling; swelling is chronic.)  Answer Assessment - Initial Assessment Questions 1. LOCATION: Where is the swelling located?  (e.g., left, right, both knees)     Right knee.  2. ONSET: When did the swelling start? Does it come and go, or is it there all the time?     This morning. Pain started Tuesday.  3. SWELLING: How bad is the swelling? Or, How large is it? (e.g., mild, moderate, severe; size of localized swelling)      She states she can tell the knee is swollen compared to the left. Mild to moderate.  4. PAIN: Is there any pain? If Yes, ask: How bad is it? (Scale 0-10; or none, mild, moderate, severe)     6-7/10. Patient taking Tylenol  arthritis  5. SETTING: Has there been any recent work, exercise or other  activity that involved that part of the body?      She states she went to [employer] on Tuesday but states she doesn't think she did any specific movements or exercises with her knee.  6. AGGRAVATING FACTORS: What makes the knee swelling worse? (e.g., walking, climbing stairs, running)     Swelling started today. Pain is worsened with movement.  7. ASSOCIATED SYMPTOMS: Is there any pain or redness?     Pain. Denies redness.  8. OTHER SYMPTOMS: Do you have any other symptoms? (e.g., calf pain, chest pain, difficulty breathing, fever)     Denies chest pain, difficulty breathing, fever. Patient states she also felt some pain radiating from her right hip.  9. PREGNANCY: Is there any chance you are pregnant? When was your last menstrual period?     N/A.  Protocols used: Knee Swelling-A-AH

## 2024-02-17 ENCOUNTER — Other Ambulatory Visit: Payer: Self-pay | Admitting: Family Medicine

## 2024-02-17 DIAGNOSIS — E7800 Pure hypercholesterolemia, unspecified: Secondary | ICD-10-CM

## 2024-02-18 ENCOUNTER — Ambulatory Visit
Admission: RE | Admit: 2024-02-18 | Discharge: 2024-02-18 | Disposition: A | Discharge status: Home / Self Care | Attending: Family Medicine | Admitting: Family Medicine

## 2024-02-18 ENCOUNTER — Encounter: Payer: Self-pay | Admitting: Family Medicine

## 2024-02-18 ENCOUNTER — Ambulatory Visit
Admission: RE | Admit: 2024-02-18 | Discharge: 2024-02-18 | Disposition: A | Discharge status: Home / Self Care | Source: Ambulatory Visit | Attending: Family Medicine | Admitting: Family Medicine

## 2024-02-18 ENCOUNTER — Ambulatory Visit (INDEPENDENT_AMBULATORY_CARE_PROVIDER_SITE_OTHER): Admitting: Family Medicine

## 2024-02-18 VITALS — BP 135/69 | HR 76 | Temp 98.1°F | Ht 64.0 in | Wt 190.9 lb

## 2024-02-18 DIAGNOSIS — M545 Low back pain, unspecified: Secondary | ICD-10-CM

## 2024-02-18 DIAGNOSIS — M5136 Other intervertebral disc degeneration, lumbar region with discogenic back pain only: Secondary | ICD-10-CM | POA: Diagnosis not present

## 2024-02-18 DIAGNOSIS — M25551 Pain in right hip: Secondary | ICD-10-CM | POA: Insufficient documentation

## 2024-02-18 DIAGNOSIS — M1711 Unilateral primary osteoarthritis, right knee: Secondary | ICD-10-CM | POA: Diagnosis not present

## 2024-02-18 DIAGNOSIS — M25561 Pain in right knee: Secondary | ICD-10-CM

## 2024-02-18 DIAGNOSIS — M47816 Spondylosis without myelopathy or radiculopathy, lumbar region: Secondary | ICD-10-CM | POA: Diagnosis not present

## 2024-02-18 MED ORDER — NAPROXEN 500 MG PO TABS: 500.0000 mg | ORAL_TABLET | Freq: Two times a day (BID) | ORAL | 0 refills | Status: DC
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
## 2024-02-22 ENCOUNTER — Telehealth: Payer: Self-pay

## 2024-02-22 NOTE — Telephone Encounter (Signed)
 Patient would like call from office when imaging results are in    Copied from CRM #[id number]. Topic: Clinical - Lab/Test Results >> Feb 22, 2024  9:38 AM Tinnie BROCKS wrote: Reason for CRM: Yania calling in requesting results from her recent x-rays. I let her know that the results were not yet released, but I would make sure someone would call her as soon as they are. She is very eager for any information.

## 2024-02-27 NOTE — Telephone Encounter (Signed)
 Please see the message below.

## 2024-02-28 NOTE — Telephone Encounter (Signed)
 Left message for patient to review results in mychart

## 2024-03-03 ENCOUNTER — Ambulatory Visit: Payer: Self-pay | Admitting: Family Medicine

## 2024-04-03 ENCOUNTER — Ambulatory Visit: Admitting: Podiatry

## 2024-04-03 DIAGNOSIS — M7752 Other enthesopathy of left foot: Secondary | ICD-10-CM | POA: Diagnosis not present

## 2024-04-03 DIAGNOSIS — M7751 Other enthesopathy of right foot: Secondary | ICD-10-CM

## 2024-04-03 NOTE — Progress Notes (Signed)
 Subjective:  Patient ID: Bridget Green, female    DOB: 08-May-1956,  MRN: [id number]  Chief Complaint  Patient presents with   Foot Pain    68 y.o. female presents with the above complaint.  Patient presents with complaint bilateral second metatarsophalangeal joint pain.  She states that started coming back again.  She was doing pretty good for few months denies any other acute complaints she would like to do another injection.   Review of Systems: Negative except as noted in the HPI. Denies N/V/F/Ch.  Past Medical History:  Diagnosis Date   Allergy    Seasonal   Arthritis    Family history of adverse reaction to anesthesia    DAD-HARD TO WAKE UP   GERD (gastroesophageal reflux disease)    History of hiatal hernia    Hyperlipidemia    Hypertension     Current Outpatient Medications:    co-enzyme Q-10 30 MG capsule, Take 30 mg by mouth 3 (three) times daily. (Patient taking differently: Take 30 mg by mouth daily.), Disp: , Rfl:    ezetimibe  (ZETIA ) 10 MG tablet, TAKE 1 TABLET EVERY DAY, Disp: 90 tablet, Rfl: 3   fluticasone  (FLONASE ) 50 MCG/ACT nasal spray, USE 2 SPRAYS IN EACH NOSTRIL AT BEDTIME, Disp: 48 g, Rfl: 10   lisinopril -hydrochlorothiazide  (ZESTORETIC ) 20-12.5 MG tablet, Take 1 tablet by mouth daily., Disp: 90 tablet, Rfl: 3   Multiple Vitamins-Minerals (CENTRUM SILVER ADULT 50+ PO), Take 1 tablet by mouth daily., Disp: , Rfl:    naproxen  (NAPROSYN ) 500 MG tablet, Take 1 tablet (500 mg total) by mouth 2 (two) times daily with a meal., Disp: 30 tablet, Rfl: 0   omeprazole  (PRILOSEC) 20 MG capsule, TAKE 1 CAPSULE EVERY DAY, Disp: 90 capsule, Rfl: 3   VITAMIN D  PO, Take by mouth., Disp: , Rfl:   Social History   Tobacco Use  Smoking Status Never  Smokeless Tobacco Never    Allergies  Allergen Reactions   Penicillins Itching and Other (See Comments)    DID THE REACTION INVOLVE: Swelling of the face/tongue/throat, SOB, or low BP? No Sudden or severe rash/hives,  skin peeling, or the inside of the mouth or nose? No Did it require medical treatment? No When did it last happen? Unknown age If all above answers are "NO", may proceed with cephalosporin use.    Sulfa Antibiotics Itching   Objective:  There were no vitals filed for this visit. There is no height or weight on file to calculate BMI. Constitutional Well developed. Well nourished.  Vascular Dorsalis pedis pulses palpable bilaterally. Posterior tibial pulses palpable bilaterally. Capillary refill normal to all digits.  No cyanosis or clubbing noted. Pedal hair growth normal.  Neurologic Normal speech. Oriented to person, place, and time. Epicritic sensation to light touch grossly present bilaterally.  Dermatologic Nails well groomed and normal in appearance. No open wounds. No skin lesions.  Orthopedic: Pain on palpation bilateralsecond metatarsophalangeal joint pain with range of motion of the second metatarsophalangeal joint.  No deep intra-articular pain noted   Radiographs: 3 views of sketcher mature adult left foot:: Uneven joint space narrowing noted at the left great toe no other bony abnormalities noted midfoot arthritis noted. Assessment:   No diagnosis found.   Plan:  Patient was evaluated and treated and all questions answered.  Bilateral second metatarsophalangeal joint capsulitis -All questions and concerns were discussed with the patient in extensive detail given the amount of pain that she is having she will benefit from steroid injection  at decreasing inflammatory component associate with pain.  Patient agrees with plan to proceed with steroid injection -A steroid injection was performed at left second MTP using 1% plain Lidocaine  and 10 mg of Kenalog . This was well tolerated.   No follow-ups on file.

## 2024-04-29 ENCOUNTER — Encounter: Payer: Self-pay | Admitting: Family Medicine

## 2024-04-29 DIAGNOSIS — M519 Unspecified thoracic, thoracolumbar and lumbosacral intervertebral disc disorder: Secondary | ICD-10-CM | POA: Insufficient documentation

## 2024-04-30 ENCOUNTER — Encounter: Payer: Self-pay | Admitting: Family Medicine

## 2024-04-30 ENCOUNTER — Ambulatory Visit (INDEPENDENT_AMBULATORY_CARE_PROVIDER_SITE_OTHER): Admitting: Family Medicine

## 2024-04-30 VITALS — BP 134/66 | HR 65 | Temp 97.7°F | Ht 64.0 in | Wt 190.3 lb

## 2024-04-30 DIAGNOSIS — J3089 Other allergic rhinitis: Secondary | ICD-10-CM

## 2024-04-30 DIAGNOSIS — Z23 Encounter for immunization: Secondary | ICD-10-CM

## 2024-04-30 DIAGNOSIS — N3941 Urge incontinence: Secondary | ICD-10-CM | POA: Insufficient documentation

## 2024-04-30 DIAGNOSIS — K219 Gastro-esophageal reflux disease without esophagitis: Secondary | ICD-10-CM

## 2024-04-30 DIAGNOSIS — I10 Essential (primary) hypertension: Secondary | ICD-10-CM | POA: Diagnosis not present

## 2024-04-30 DIAGNOSIS — E559 Vitamin D deficiency, unspecified: Secondary | ICD-10-CM | POA: Diagnosis not present

## 2024-04-30 DIAGNOSIS — E66811 Obesity, class 1: Secondary | ICD-10-CM | POA: Diagnosis not present

## 2024-04-30 DIAGNOSIS — E782 Mixed hyperlipidemia: Secondary | ICD-10-CM

## 2024-04-30 DIAGNOSIS — E6609 Other obesity due to excess calories: Secondary | ICD-10-CM

## 2024-04-30 DIAGNOSIS — Z6832 Body mass index (BMI) 32.0-32.9, adult: Secondary | ICD-10-CM

## 2024-04-30 NOTE — Assessment & Plan Note (Signed)
 Hyperlipidemia Chronic hyperlipidemia managed with Zetia  10 mg daily. - Continue Zetia  10 mg daily - Continue Coenzyme Q10 30 mg daily

## 2024-04-30 NOTE — Assessment & Plan Note (Signed)
 Chronic condition  BMI is 32, class 1 obesity. Reports challenges with dietary habits, particularly sweets. Physical activity includes participation in [employer] program. - Discuss dietary habits focusing on reducing sweets - Encourage continuation of physical activity with Silver Sneakers

## 2024-04-30 NOTE — Assessment & Plan Note (Signed)
 Urge incontinence Urge incontinence with urgency and occasional incontinence upon arriving home. Prefers to avoid medication due to potential side effects. Declined trial of oxybutynin due to side effects such as dry mouth. Encouraged to increase water  intake to prevent dehydration, which may exacerbate symptoms. - Encourage pelvic floor exercises (Kegels) - Consider pelvic floor physical therapy if symptoms worsen

## 2024-04-30 NOTE — Progress Notes (Signed)
 Established patient visit   [name]   DOB: 06/18/1956   68 y.o. Female  MRN: [id number] Visit Date: 04/30/2024  Today's healthcare provider: Rockie Agent, MD   Chief Complaint  Patient presents with   Hypertension    Present for htn follow up. Has been checking BP at home, average readings of 120s-80s at home.    Immunizations    Will receive flu vaccine today    Subjective     HPI     Hypertension    Additional comments: Present for htn follow up. Has been checking BP at home, average readings of 120s-80s at home.         Immunizations    Additional comments: Will receive flu vaccine today       Last edited by Cherry Chiquita HERO, CMA on 04/30/2024  8:41 AM.       Discussed the use of AI scribe software for clinical note transcription with the patient, who gave verbal consent to proceed.  History of Present Illness Bridget Green is a 68 year old female who presents for follow-up and a flu vaccine.  She has chronic hypertension and monitors her blood pressure at home, with average readings in the 120s/80s. She forgot to take her medication once, resulting in a reading of 151 mmHg. Her current medication regimen includes lisinopril  20 mg and hydrochlorothiazide  12.5 mg daily, taken with her morning coffee at 5 AM. No issues with her current blood pressure medication regimen aside from the one incident of forgetting to take it.  She has chronic hyperlipidemia, managed with Zetia  10 mg daily, and chronic GERD, managed with Prilosec 20 mg daily. For chronic allergic rhinitis, she uses Flonase  50 mcg, two sprays in each nostril daily. Additionally, she takes a multivitamin, vitamin D , and Coenzyme Q10 30 mg daily for health maintenance.  She struggles with weight management, currently weighing 190.3 lbs with a BMI of 32, classifying her as class one obesity. She attributes some weight gain to her love of sweets, noting a family history of a  sweet tooth. She often shares meals with her husband when dining out and finds sweets challenging for her diet. She participates in [employer] for physical activity.  She experiences urinary urgency, particularly when arriving home, and sometimes is unable to make it to the bathroom in time. The urgency is described as 'aggravating' and inconsistent, as she can sometimes make it to the bathroom in time. She does not wish to pursue medication for this issue, preferring to manage it on her own.  Her fluid intake is low, primarily consisting of coffee in the morning and occasional tea or milk with dinner. She acknowledges not drinking much water , which may contribute to dehydration. She has experienced muscle cramps in the past, which have improved recently, possibly due to better hydration practices.  She reports continued adherence to medication regimen for her GERD, HLD and vitamin D  as well as MV regimen   Past Medical History:  Diagnosis Date   Allergy    Seasonal   Arthritis    Family history of adverse reaction to anesthesia    DAD-HARD TO WAKE UP   GERD (gastroesophageal reflux disease)    History of hiatal hernia    Hyperlipidemia    Hypertension     Medications: Outpatient Medications Prior to Visit  Medication Sig   co-enzyme Q-10 30 MG capsule Take 30 mg by mouth 3 (three) times daily. (Patient taking  differently: Take 30 mg by mouth daily.)   ezetimibe  (ZETIA ) 10 MG tablet TAKE 1 TABLET EVERY DAY   fluticasone  (FLONASE ) 50 MCG/ACT nasal spray USE 2 SPRAYS IN EACH NOSTRIL AT BEDTIME   lisinopril -hydrochlorothiazide  (ZESTORETIC ) 20-12.5 MG tablet Take 1 tablet by mouth daily.   Multiple Vitamins-Minerals (CENTRUM SILVER ADULT 50+ PO) Take 1 tablet by mouth daily.   omeprazole  (PRILOSEC) 20 MG capsule TAKE 1 CAPSULE EVERY DAY   VITAMIN D  PO Take by mouth.   [DISCONTINUED] naproxen  (NAPROSYN ) 500 MG tablet Take 1 tablet (500 mg total) by mouth 2 (two) times daily with a  meal.   No facility-administered medications prior to visit.    Review of Systems  Last metabolic panel Lab Results  Component Value Date   GLUCOSE 96 01/10/2024   NA 143 01/10/2024   K 4.6 01/10/2024   CL 107 (H) 01/10/2024   CO2 19 (L) 01/10/2024   BUN 15 01/10/2024   CREATININE 0.85 01/10/2024   EGFR 74 01/10/2024   CALCIUM  9.6 01/10/2024   PROT 6.6 01/10/2024   ALBUMIN 4.3 01/10/2024   LABGLOB 2.3 01/10/2024   AGRATIO 1.9 12/06/2022   BILITOT 0.4 01/10/2024   ALKPHOS 93 01/10/2024   AST 17 01/10/2024   ALT 20 01/10/2024   ANIONGAP 5 (L) 12/07/2011        Objective    BP 134/66 (Cuff Size: Normal)   Pulse 65   Temp 97.7 F (36.5 C) (Oral)   Ht 5' 4 (1.626 m)   Wt 190 lb 4.8 oz (86.3 kg)   SpO2 100%   BMI 32.66 kg/m   BP Readings from Last 3 Encounters:  04/30/24 134/66  02/18/24 135/69  01/10/24 137/73   Wt Readings from Last 3 Encounters:  04/30/24 190 lb 4.8 oz (86.3 kg)  02/18/24 190 lb 14.4 oz (86.6 kg)  01/10/24 187 lb (84.8 kg)        Physical Exam  Physical Exam VITALS: BP- 134/66 MEASUREMENTS: Weight- 190.3, BMI- 32.0. CHEST: Clear to auscultation bilaterally, no wheezes, rhonchi, or crackles. CARDIOVASCULAR: Normal heart rate and rhythm, S1 and S2 normal without murmurs    No results found for any visits on 04/30/24.  Assessment & Plan     Problem List Items Addressed This Visit     Acid reflux   Gastroesophageal reflux disease (GERD) Chronic GERD managed with Prilosec 20 mg daily. - Continue Prilosec 20 mg daily      Avitaminosis D   Class 1 obesity due to excess calories with serious comorbidity and body mass index (BMI) of 32.0 to 32.9 in adult   Chronic condition  BMI is 32, class 1 obesity. Reports challenges with dietary habits, particularly sweets. Physical activity includes participation in [employer] program. - Discuss dietary habits focusing on reducing sweets - Encourage continuation of physical activity  with [employer]      Essential (primary) hypertension - Primary   Hypertension Chronic hypertension with home blood pressure readings averaging 120s/80s. In-office reading elevated at 144/66. Improved to 134/66 on recheck\. Good adherence to medication with occasional missed doses leading to elevated readings. - Continue lisinopril - hydrochlorothiazide  20-12.5 mg daily      Mixed hyperlipidemia   Hyperlipidemia Chronic hyperlipidemia managed with Zetia  10 mg daily. - Continue Zetia  10 mg daily - Continue Coenzyme Q10 30 mg daily      Urge incontinence of urine   Urge incontinence Urge incontinence with urgency and occasional incontinence upon arriving home. Prefers to avoid medication  due to potential side effects. Declined trial of oxybutynin due to side effects such as dry mouth. Encouraged to increase water  intake to prevent dehydration, which may exacerbate symptoms. - Encourage pelvic floor exercises (Kegels) - Consider pelvic floor physical therapy if symptoms worsen      Other Visit Diagnoses       Immunization due       Relevant Orders   Flu vaccine HIGH DOSE PF(Fluzone Trivalent) (Completed)     Seasonal allergic rhinitis due to other allergic trigger           Assessment and Plan Assessment & Plan      Allergic rhinitis Chronic allergic rhinitis managed with Flonase  50 mcg, two sprays each nostril daily. Symptoms consistent with seasonal allergies. - Continue Flonase  50 mcg, two sprays each nostril daily   HM  Influenza vaccine was administered today  Patient tolerated well      Return in about 6 months (around 10/29/2024) for HTN.         Rockie Agent, MD  [facility] [phone] (phone) [phone] (fax)  [facility]

## 2024-04-30 NOTE — Patient Instructions (Signed)
 To keep you healthy, please keep in mind the following health maintenance items that you are due for:   There are no preventive care reminders to display for this patient.   Best Wishes,   Dr. Lang

## 2024-04-30 NOTE — Assessment & Plan Note (Signed)
 Hypertension Chronic hypertension with home blood pressure readings averaging 120s/80s. In-office reading elevated at 144/66. Improved to 134/66 on recheck\. Good adherence to medication with occasional missed doses leading to elevated readings. - Continue lisinopril - hydrochlorothiazide  20-12.5 mg daily

## 2024-04-30 NOTE — Assessment & Plan Note (Signed)
 Gastroesophageal reflux disease (GERD) Chronic GERD managed with Prilosec 20 mg daily. - Continue Prilosec 20 mg daily

## 2024-06-06 ENCOUNTER — Telehealth: Payer: Self-pay

## 2024-06-06 DIAGNOSIS — J309 Allergic rhinitis, unspecified: Secondary | ICD-10-CM

## 2024-06-09 NOTE — Telephone Encounter (Unsigned)
 Copied from CRM #[id number]. Topic: Clinical - Medication Refill >> Jun 09, 2024  7:36 AM Olam RAMAN wrote: Medication: fluticasone  (FLONASE ) 50 MCG/ACT nasal spray   Has the patient contacted their pharmacy? No (Agent: If no, request that the patient contact the pharmacy for the refill. If patient does not wish to contact the pharmacy document the reason why and proceed with request.) (Agent: If yes, when and what did the pharmacy advise?)  This is the patient's preferred pharmacy:   [facility] - [place], [place] - [address] [place] [address] Phone: [phone] Fax: [phone]  Is this the correct pharmacy for this prescription? Yes If no, delete pharmacy and type the correct one.   Has the prescription been filled recently? No  Is the patient out of the medication? No  Has the patient been seen for an appointment in the last year OR does the patient have an upcoming appointment? Yes  Can we respond through MyChart? Yes  Agent: Please be advised that Rx refills may take up to 3 business days. We ask that you follow-up with your pharmacy.

## 2024-06-12 MED ORDER — FLUTICASONE PROPIONATE 50 MCG/ACT NA SUSP: NASAL | 5 refills | Status: AC

## 2024-08-13 ENCOUNTER — Other Ambulatory Visit: Payer: Self-pay | Admitting: Family Medicine

## 2024-08-13 DIAGNOSIS — Z1231 Encounter for screening mammogram for malignant neoplasm of breast: Secondary | ICD-10-CM

## 2024-09-02 ENCOUNTER — Ambulatory Visit: Payer: Medicare HMO

## 2024-09-11 ENCOUNTER — Encounter
